# Patient Record
Sex: Male | Born: 1935 | Race: White | Hispanic: No | Smoking: Current every day smoker
Health system: Southern US, Community
[De-identification: ages and names within clinical notes are randomized; demographics above are authoritative.]

## PROBLEM LIST (undated history)

## (undated) DIAGNOSIS — D4959 Neoplasm of unspecified behavior of other genitourinary organ: Secondary | ICD-10-CM

## (undated) DIAGNOSIS — I10 Essential (primary) hypertension: Secondary | ICD-10-CM

## (undated) DIAGNOSIS — K859 Acute pancreatitis without necrosis or infection, unspecified: Secondary | ICD-10-CM

## (undated) DIAGNOSIS — Z978 Presence of other specified devices: Secondary | ICD-10-CM

## (undated) DIAGNOSIS — C801 Malignant (primary) neoplasm, unspecified: Secondary | ICD-10-CM

## (undated) DIAGNOSIS — R339 Retention of urine, unspecified: Secondary | ICD-10-CM

## (undated) DIAGNOSIS — M8430XA Stress fracture, unspecified site, initial encounter for fracture: Secondary | ICD-10-CM

## (undated) DIAGNOSIS — M8580 Other specified disorders of bone density and structure, unspecified site: Secondary | ICD-10-CM

## (undated) DIAGNOSIS — J984 Other disorders of lung: Secondary | ICD-10-CM

## (undated) DIAGNOSIS — W19XXXA Unspecified fall, initial encounter: Secondary | ICD-10-CM

## (undated) DIAGNOSIS — F060 Psychotic disorder with hallucinations due to known physiological condition: Secondary | ICD-10-CM

## (undated) DIAGNOSIS — R443 Hallucinations, unspecified: Secondary | ICD-10-CM

## (undated) DIAGNOSIS — S022XXA Fracture of nasal bones, initial encounter for closed fracture: Secondary | ICD-10-CM

## (undated) DIAGNOSIS — I499 Cardiac arrhythmia, unspecified: Secondary | ICD-10-CM

## (undated) DIAGNOSIS — F419 Anxiety disorder, unspecified: Secondary | ICD-10-CM

## (undated) DIAGNOSIS — I4891 Unspecified atrial fibrillation: Secondary | ICD-10-CM

## (undated) DIAGNOSIS — S72009A Fracture of unspecified part of neck of unspecified femur, initial encounter for closed fracture: Secondary | ICD-10-CM

## (undated) DIAGNOSIS — R4781 Slurred speech: Secondary | ICD-10-CM

## (undated) DIAGNOSIS — N390 Urinary tract infection, site not specified: Secondary | ICD-10-CM

## (undated) DIAGNOSIS — R2681 Unsteadiness on feet: Secondary | ICD-10-CM

## (undated) DIAGNOSIS — Z960 Presence of urogenital implants: Secondary | ICD-10-CM

## (undated) DIAGNOSIS — L97309 Non-pressure chronic ulcer of unspecified ankle with unspecified severity: Secondary | ICD-10-CM

## (undated) DIAGNOSIS — F99 Mental disorder, not otherwise specified: Secondary | ICD-10-CM

## (undated) HISTORY — PX: RETINAL DETACHMENT SURGERY: SHX105

## (undated) HISTORY — PX: CYSTOSCOPY: SUR368

## (undated) HISTORY — PX: PARTIAL HIP ARTHROPLASTY: SHX733

## (undated) HISTORY — PX: TRANSURETHRAL RESECTION OF BLADDER TUMOR: SHX2575

## (undated) HISTORY — PX: CHOLECYSTECTOMY: SHX55

---

## 1998-05-22 ENCOUNTER — Ambulatory Visit (HOSPITAL_COMMUNITY): Admission: RE | Admit: 1998-05-22 | Discharge: 1998-05-22 | Payer: Self-pay | Admitting: Urology

## 2001-02-17 ENCOUNTER — Ambulatory Visit (HOSPITAL_COMMUNITY): Admission: RE | Admit: 2001-02-17 | Discharge: 2001-02-17 | Payer: Self-pay | Admitting: *Deleted

## 2001-04-25 ENCOUNTER — Ambulatory Visit (HOSPITAL_COMMUNITY): Admission: RE | Admit: 2001-04-25 | Discharge: 2001-04-25 | Payer: Self-pay | Admitting: *Deleted

## 2001-04-25 ENCOUNTER — Encounter: Payer: Self-pay | Admitting: *Deleted

## 2002-12-22 LAB — HM COLONOSCOPY

## 2003-05-17 ENCOUNTER — Inpatient Hospital Stay (HOSPITAL_COMMUNITY): Admission: EM | Admit: 2003-05-17 | Discharge: 2003-05-18 | Payer: Self-pay | Admitting: Emergency Medicine

## 2003-05-17 ENCOUNTER — Encounter: Payer: Self-pay | Admitting: Internal Medicine

## 2005-06-19 ENCOUNTER — Encounter: Admission: RE | Admit: 2005-06-19 | Discharge: 2005-06-19 | Payer: Self-pay | Admitting: Urology

## 2005-06-24 ENCOUNTER — Ambulatory Visit (HOSPITAL_BASED_OUTPATIENT_CLINIC_OR_DEPARTMENT_OTHER): Admission: RE | Admit: 2005-06-24 | Discharge: 2005-06-24 | Payer: Self-pay | Admitting: Urology

## 2005-06-24 ENCOUNTER — Ambulatory Visit (HOSPITAL_COMMUNITY): Admission: RE | Admit: 2005-06-24 | Discharge: 2005-06-24 | Payer: Self-pay | Admitting: Urology

## 2005-06-24 ENCOUNTER — Encounter (INDEPENDENT_AMBULATORY_CARE_PROVIDER_SITE_OTHER): Payer: Self-pay | Admitting: Specialist

## 2007-05-20 ENCOUNTER — Ambulatory Visit (HOSPITAL_COMMUNITY): Admission: RE | Admit: 2007-05-20 | Discharge: 2007-05-20 | Payer: Self-pay | Admitting: Dermatology

## 2007-08-19 ENCOUNTER — Ambulatory Visit: Payer: Self-pay | Admitting: Internal Medicine

## 2007-08-26 ENCOUNTER — Ambulatory Visit: Payer: Self-pay | Admitting: Internal Medicine

## 2007-09-05 ENCOUNTER — Ambulatory Visit: Payer: Self-pay

## 2007-09-05 ENCOUNTER — Ambulatory Visit: Payer: Self-pay | Admitting: Internal Medicine

## 2007-09-05 ENCOUNTER — Encounter: Payer: Self-pay | Admitting: Internal Medicine

## 2007-09-19 ENCOUNTER — Ambulatory Visit: Payer: Self-pay | Admitting: Cardiovascular Disease

## 2007-10-10 ENCOUNTER — Ambulatory Visit: Payer: Self-pay | Admitting: Internal Medicine

## 2007-10-26 ENCOUNTER — Ambulatory Visit (HOSPITAL_COMMUNITY): Admission: RE | Admit: 2007-10-26 | Discharge: 2007-10-26 | Payer: Self-pay | Admitting: Ophthalmology

## 2007-11-07 ENCOUNTER — Ambulatory Visit: Payer: Self-pay | Admitting: Cardiology

## 2007-11-29 ENCOUNTER — Ambulatory Visit: Payer: Self-pay | Admitting: Internal Medicine

## 2007-11-29 ENCOUNTER — Ambulatory Visit: Payer: Self-pay | Admitting: Cardiology

## 2008-10-01 ENCOUNTER — Ambulatory Visit: Payer: Self-pay | Admitting: Internal Medicine

## 2009-10-17 ENCOUNTER — Ambulatory Visit: Payer: Self-pay | Admitting: Internal Medicine

## 2009-10-18 DIAGNOSIS — I10 Essential (primary) hypertension: Secondary | ICD-10-CM

## 2009-10-18 DIAGNOSIS — I4891 Unspecified atrial fibrillation: Secondary | ICD-10-CM

## 2009-10-18 DIAGNOSIS — I495 Sick sinus syndrome: Secondary | ICD-10-CM

## 2009-10-28 ENCOUNTER — Encounter: Payer: Self-pay | Admitting: Internal Medicine

## 2010-02-03 ENCOUNTER — Encounter: Payer: Self-pay | Admitting: Internal Medicine

## 2010-02-05 ENCOUNTER — Ambulatory Visit (HOSPITAL_COMMUNITY): Admission: RE | Admit: 2010-02-05 | Discharge: 2010-02-05 | Payer: Self-pay | Admitting: Ophthalmology

## 2010-10-22 ENCOUNTER — Ambulatory Visit: Payer: Self-pay | Admitting: Internal Medicine

## 2011-01-09 ENCOUNTER — Encounter: Payer: Self-pay | Admitting: Internal Medicine

## 2011-01-16 ENCOUNTER — Encounter
Admission: RE | Admit: 2011-01-16 | Discharge: 2011-01-16 | Payer: Self-pay | Source: Home / Self Care | Attending: Internal Medicine | Admitting: Internal Medicine

## 2011-01-20 NOTE — Assessment & Plan Note (Signed)
Summary: yearly/sl   Visit Type:  Follow-up Primary Brent Day:  Brent Brooklyn, MD   History of Present Illness: Brent Day returns today for followup.  He is a very pleasant 77 year old male with a history of paroxysmal AFib and rare palpitations who we have treated with medications and strategy of rate control for the last year or two.  He returns today for followup.  He denies chest pain.  He denies shortness of breath.  He continued to be quite active.    Current Medications (verified): 1)  Metoprolol Succinate 50 Mg Xr24h-Tab (Metoprolol Succinate) .... Take One and Half  Tablet By Mouth Daily 2)  Benicar 40 Mg Tabs (Olmesartan Medoxomil) .... Take One Tablet By Mouth Daily 3)  Gemfibrozil 600 Mg Tabs (Gemfibrozil) .... Take 1 By Mouth Once Daily 4)  Pradaxa 150 Mg Caps (Dabigatran Etexilate Mesylate) .... Two Times A Day 5)  Multivitamins  Tabs (Multiple Vitamin) .... Once Daily 6)  Vitamin D3 2000 Unit Tabs (Cholecalciferol) .... Daily 7)  Vitamin E 200 Unit Caps (Vitamin E) .... Once Daily 8)  Vitamin C 250 Mg Tabs (Ascorbic Acid) .... Once Daily 9)  Flax   Oil (Flaxseed (Linseed)) .... Daily  Allergies (verified): No Known Drug Allergies  Past History:  Past Medical History: Last updated: 10/09/2009  Paroxysmal atrial fibrillation.    Chronic Coumadin therapy.    Borderline hypertension.    Palpitations with documented premature atrial contractions.   Review of Systems  The patient denies chest pain, syncope, dyspnea on exertion, and peripheral edema.    Vital Signs:  Patient profile:   77 year old male Height:      71 inches Weight:      168 pounds BMI:     23.52 Pulse rate:   88 / minute BP sitting:   130 / 70  (left arm)  Vitals Entered By: Laurance Flatten CMA (October 22, 2010 3:25 PM)  Physical Exam  General:  Elderly, well developed, well nourished, in no acute distress.  HEENT: normal Neck: supple. No JVD. Carotids 2+ bilaterally no bruits Cor: IRRR  no rubs, gallops or murmur Lungs: CTA Ab: soft, nontender. nondistended. No HSM. Good bowel sounds Ext: warm. no cyanosis, clubbing or edema Neuro: alert and oriented. Grossly nonfocal. affect pleasant    EKG  Procedure date:  10/22/2010  Findings:      Normal sinus rhythm with rate of:  74.  Impression & Recommendations:  Problem # 1:  ATRIAL FIBRILLATION (ICD-427.31) He appears to be maintaining NSR very nicely despite a strategy of rate control. Will continue. His updated medication list for this problem includes:    Metoprolol Succinate 50 Mg Xr24h-tab (Metoprolol succinate) .Marland Kitchen... Take one and half  tablet by mouth daily  Problem # 2:  ESSENTIAL HYPERTENSION, BENIGN (ICD-401.1) His blood pressure is well controlled.  He will maintain a low sodium diet. His updated medication list for this problem includes:    Metoprolol Succinate 50 Mg Xr24h-tab (Metoprolol succinate) .Marland Kitchen... Take one and half  tablet by mouth daily    Benicar 40 Mg Tabs (Olmesartan medoxomil) .Marland Kitchen... Take one tablet by mouth daily

## 2011-02-27 ENCOUNTER — Emergency Department (HOSPITAL_COMMUNITY): Payer: Medicare Other

## 2011-02-27 ENCOUNTER — Inpatient Hospital Stay (HOSPITAL_COMMUNITY)
Admission: EM | Admit: 2011-02-27 | Discharge: 2011-03-06 | DRG: 418 | Disposition: A | Discharge status: Home Health | Payer: Medicare Other | Attending: Internal Medicine | Admitting: Internal Medicine

## 2011-02-27 DIAGNOSIS — K861 Other chronic pancreatitis: Secondary | ICD-10-CM | POA: Diagnosis present

## 2011-02-27 DIAGNOSIS — T380X5A Adverse effect of glucocorticoids and synthetic analogues, initial encounter: Secondary | ICD-10-CM | POA: Diagnosis not present

## 2011-02-27 DIAGNOSIS — R5381 Other malaise: Secondary | ICD-10-CM | POA: Diagnosis present

## 2011-02-27 DIAGNOSIS — I4891 Unspecified atrial fibrillation: Secondary | ICD-10-CM | POA: Diagnosis present

## 2011-02-27 DIAGNOSIS — K859 Acute pancreatitis without necrosis or infection, unspecified: Principal | ICD-10-CM | POA: Diagnosis present

## 2011-02-27 DIAGNOSIS — K8689 Other specified diseases of pancreas: Secondary | ICD-10-CM | POA: Diagnosis present

## 2011-02-27 DIAGNOSIS — K863 Pseudocyst of pancreas: Secondary | ICD-10-CM | POA: Diagnosis present

## 2011-02-27 DIAGNOSIS — K862 Cyst of pancreas: Secondary | ICD-10-CM | POA: Diagnosis present

## 2011-02-27 DIAGNOSIS — F17200 Nicotine dependence, unspecified, uncomplicated: Secondary | ICD-10-CM | POA: Diagnosis present

## 2011-02-27 DIAGNOSIS — K8020 Calculus of gallbladder without cholecystitis without obstruction: Secondary | ICD-10-CM | POA: Diagnosis present

## 2011-02-27 DIAGNOSIS — E785 Hyperlipidemia, unspecified: Secondary | ICD-10-CM | POA: Diagnosis present

## 2011-02-27 DIAGNOSIS — I10 Essential (primary) hypertension: Secondary | ICD-10-CM | POA: Diagnosis present

## 2011-02-27 DIAGNOSIS — N289 Disorder of kidney and ureter, unspecified: Secondary | ICD-10-CM | POA: Diagnosis present

## 2011-02-27 DIAGNOSIS — D72829 Elevated white blood cell count, unspecified: Secondary | ICD-10-CM | POA: Diagnosis not present

## 2011-02-27 LAB — CBC
HCT: 39.3 % (ref 39.0–52.0)
Hemoglobin: 13.1 g/dL (ref 13.0–17.0)
MCH: 31.5 pg (ref 26.0–34.0)
MCHC: 33.3 g/dL (ref 30.0–36.0)
MCV: 94.5 fL (ref 78.0–100.0)
RDW: 13 % (ref 11.5–15.5)

## 2011-02-27 LAB — COMPREHENSIVE METABOLIC PANEL
Albumin: 2.7 g/dL — ABNORMAL LOW (ref 3.5–5.2)
BUN: 30 mg/dL — ABNORMAL HIGH (ref 6–23)
Chloride: 101 mEq/L (ref 96–112)
Creatinine, Ser: 1.26 mg/dL (ref 0.4–1.5)
Total Bilirubin: 0.5 mg/dL (ref 0.3–1.2)
Total Protein: 7.3 g/dL (ref 6.0–8.3)

## 2011-02-27 LAB — URINALYSIS, ROUTINE W REFLEX MICROSCOPIC
Bilirubin Urine: NEGATIVE
Glucose, UA: NEGATIVE mg/dL
Hgb urine dipstick: NEGATIVE
Nitrite: NEGATIVE

## 2011-02-27 LAB — DIFFERENTIAL
Basophils Absolute: 0 10*3/uL (ref 0.0–0.1)
Eosinophils Relative: 1 % (ref 0–5)
Lymphocytes Relative: 11 % — ABNORMAL LOW (ref 12–46)
Lymphs Abs: 2.2 10*3/uL (ref 0.7–4.0)
Monocytes Absolute: 1.1 10*3/uL — ABNORMAL HIGH (ref 0.1–1.0)
Monocytes Relative: 5 % (ref 3–12)
Neutro Abs: 16.9 10*3/uL — ABNORMAL HIGH (ref 1.7–7.7)

## 2011-02-27 LAB — TROPONIN I: Troponin I: 0.01 ng/mL (ref 0.00–0.06)

## 2011-02-27 LAB — CK TOTAL AND CKMB (NOT AT ARMC)
CK, MB: 1.9 ng/mL (ref 0.3–4.0)
Total CK: 47 U/L (ref 7–232)

## 2011-02-27 MED ORDER — IOHEXOL 300 MG/ML  SOLN: 125.0000 mL | Freq: Once | INTRAMUSCULAR | Status: AC | PRN

## 2011-02-28 ENCOUNTER — Inpatient Hospital Stay (HOSPITAL_COMMUNITY): Payer: Medicare Other

## 2011-02-28 LAB — BASIC METABOLIC PANEL
Calcium: 8.4 mg/dL (ref 8.4–10.5)
Chloride: 103 mEq/L (ref 96–112)
Creatinine, Ser: 1 mg/dL (ref 0.4–1.5)
GFR calc Af Amer: >60 mL/min (ref >60)
GFR calc non Af Amer: >60 mL/min (ref >60)

## 2011-02-28 LAB — DIFFERENTIAL
Basophils Absolute: 0 10*3/uL (ref 0.0–0.1)
Eosinophils Absolute: 0.3 10*3/uL (ref 0.0–0.7)
Lymphs Abs: 2.3 10*3/uL (ref 0.7–4.0)
Neutrophils Relative %: 76 % (ref 43–77)

## 2011-02-28 LAB — CBC
MCV: 95.3 fL (ref 78.0–100.0)
Platelets: 314 10*3/uL (ref 150–400)
RBC: 3.38 MIL/uL — ABNORMAL LOW (ref 4.22–5.81)
WBC: 13.3 10*3/uL — ABNORMAL HIGH (ref 4.0–10.5)

## 2011-02-28 LAB — PROTIME-INR
INR: 1.25 (ref 0.00–1.49)
Prothrombin Time: 15.9 seconds — ABNORMAL HIGH (ref 11.6–15.2)

## 2011-02-28 LAB — CARDIAC PANEL(CRET KIN+CKTOT+MB+TROPI)
Relative Index: INVALID (ref 0.0–2.5)
Total CK: 40 U/L (ref 7–232)
Troponin I: 0.02 ng/mL (ref 0.00–0.06)

## 2011-02-28 LAB — APTT: aPTT: 36 seconds (ref 24–37)

## 2011-02-28 MED ORDER — GADOBENATE DIMEGLUMINE 529 MG/ML IV SOLN
14.0000 mL | Freq: Once | INTRAVENOUS | Status: AC | PRN
  Administered 2011-02-28: 14 mL via INTRAVENOUS

## 2011-02-28 MED ORDER — IOHEXOL 300 MG/ML  SOLN
125.0000 mL | Freq: Once | INTRAMUSCULAR | Status: AC | PRN
  Administered 2011-02-28: 125 mL via INTRAVENOUS

## 2011-03-01 LAB — COMPREHENSIVE METABOLIC PANEL
ALT: 15 U/L (ref 0–53)
AST: 17 U/L (ref 0–37)
Albumin: 2 g/dL — ABNORMAL LOW (ref 3.5–5.2)
CO2: 23 mEq/L (ref 19–32)
Calcium: 8.4 mg/dL (ref 8.4–10.5)
GFR calc Af Amer: >60 mL/min (ref >60)
GFR calc non Af Amer: >60 mL/min (ref >60)
Sodium: 136 mEq/L (ref 135–145)
Total Protein: 5.5 g/dL — ABNORMAL LOW (ref 6.0–8.3)

## 2011-03-01 LAB — CBC
HCT: 31.8 % — ABNORMAL LOW (ref 39.0–52.0)
Hemoglobin: 10.2 g/dL — ABNORMAL LOW (ref 13.0–17.0)
MCH: 30.6 pg (ref 26.0–34.0)
MCHC: 32.1 g/dL (ref 30.0–36.0)
MCV: 95.5 fL (ref 78.0–100.0)

## 2011-03-02 LAB — CBC
MCH: 30.1 pg (ref 26.0–34.0)
MCHC: 31.6 g/dL (ref 30.0–36.0)
MCV: 95.1 fL (ref 78.0–100.0)
Platelets: 329 10*3/uL (ref 150–400)

## 2011-03-02 LAB — BASIC METABOLIC PANEL
BUN: 10 mg/dL (ref 6–23)
CO2: 25 mEq/L (ref 19–32)
Calcium: 8.5 mg/dL (ref 8.4–10.5)
Creatinine, Ser: 0.91 mg/dL (ref 0.4–1.5)
GFR calc Af Amer: >60 mL/min (ref >60)

## 2011-03-03 ENCOUNTER — Other Ambulatory Visit: Payer: Self-pay | Admitting: Gastroenterology

## 2011-03-03 LAB — BASIC METABOLIC PANEL
Calcium: 8.4 mg/dL (ref 8.4–10.5)
Creatinine, Ser: 0.87 mg/dL (ref 0.4–1.5)
GFR calc Af Amer: >60 mL/min (ref >60)
GFR calc non Af Amer: >60 mL/min (ref >60)
Glucose, Bld: 93 mg/dL (ref 70–99)
Sodium: 138 mEq/L (ref 135–145)

## 2011-03-05 ENCOUNTER — Other Ambulatory Visit: Payer: Self-pay | Admitting: Surgery

## 2011-03-05 ENCOUNTER — Inpatient Hospital Stay (HOSPITAL_COMMUNITY): Payer: Medicare Other

## 2011-03-05 LAB — BASIC METABOLIC PANEL
BUN: 9 mg/dL (ref 6–23)
Calcium: 8.5 mg/dL (ref 8.4–10.5)
Creatinine, Ser: 1.04 mg/dL (ref 0.4–1.5)
GFR calc non Af Amer: >60 mL/min (ref >60)
Glucose, Bld: 105 mg/dL — ABNORMAL HIGH (ref 70–99)
Potassium: 4.2 mEq/L (ref 3.5–5.1)

## 2011-03-05 LAB — CBC
HCT: 33.5 % — ABNORMAL LOW (ref 39.0–52.0)
MCHC: 31.9 g/dL (ref 30.0–36.0)
MCV: 95.7 fL (ref 78.0–100.0)
Platelets: 352 10*3/uL (ref 150–400)
RDW: 13.1 % (ref 11.5–15.5)
WBC: 8.2 10*3/uL (ref 4.0–10.5)

## 2011-03-06 LAB — CBC
HCT: 33.7 % — ABNORMAL LOW (ref 39.0–52.0)
MCHC: 31.8 g/dL (ref 30.0–36.0)
Platelets: 351 10*3/uL (ref 150–400)
RDW: 13.3 % (ref 11.5–15.5)
WBC: 8.8 10*3/uL (ref 4.0–10.5)

## 2011-03-06 LAB — CULTURE, BLOOD (ROUTINE X 2)
Culture  Setup Time: 201203101159
Culture: NO GROWTH

## 2011-03-06 LAB — COMPREHENSIVE METABOLIC PANEL
ALT: 30 U/L (ref 0–53)
AST: 45 U/L — ABNORMAL HIGH (ref 0–37)
Albumin: 2.2 g/dL — ABNORMAL LOW (ref 3.5–5.2)
Alkaline Phosphatase: 74 U/L (ref 39–117)
Calcium: 8.5 mg/dL (ref 8.4–10.5)
GFR calc Af Amer: >60 mL/min (ref >60)
Glucose, Bld: 104 mg/dL — ABNORMAL HIGH (ref 70–99)
Potassium: 4.1 mEq/L (ref 3.5–5.1)
Sodium: 139 mEq/L (ref 135–145)
Total Protein: 5.5 g/dL — ABNORMAL LOW (ref 6.0–8.3)

## 2011-03-06 NOTE — Op Note (Signed)
NAME:  Brent Day, Brent Day NO.:  [id number]  MEDICAL RECORD NO.:  [id number]           PATIENT TYPE:  I  LOCATION:  1507                         FACILITY:  [facility]  PHYSICIAN:  Aeron Donaghey A. Bassam Dresch, M.D.DATE OF BIRTH:  1927/08/19  DATE OF PROCEDURE:  03/05/2011 DATE OF DISCHARGE:                              OPERATIVE REPORT   PREOPERATIVE DIAGNOSIS:  Gallstone pancreatitis.  POSTOPERATIVE DIAGNOSIS:  Gallstone pancreatitis.  PROCEDURE:  Laparoscopic cholecystectomy with intraoperative cholangiogram.  SURGEON:  Kleo Dungee A. Johnathon Mittal, MD  ANESTHESIA:  General endotracheal anesthesia, 0.25% Sensorcaine locally.  ESTIMATED BLOOD LOSS:  Minimal.  SPECIMEN:  Gallbladder, gallstones to Pathology.  DRAINS:  None.  INDICATIONS FOR PROCEDURE:  The patient is a pleasant 77 year old male admitted with gallstone pancreatitis.  Pancreatitis resolved and he presents today for laparoscopic cholecystectomy and discussion of procedure, rationale for doing it, risks and benefits and long-term expectations.  Risk of bleeding, infection, bile duct injury, injury to colon, small bowel, liver, stomach, duodenum, abdominal wall diaphragm as well as bile leak with possible abscess drain and other procedures needed were discussed.  He agreed to proceed.  DESCRIPTION OF PROCEDURE:  The patient was brought to the operating room, placed supine.  After induction of general anesthesia, the abdomen was prepped and draped in sterile fashion.  Time-out was done and he received preoperative antibiotics.  A 1 cm infraumbilical incision was made.  Dissection was carried down to the fascia.  Fascia was opened in lower midline.  The abdominal cavity was entered.  Pursestring suture of 0 Vicryl was placed and 12-mm Hasson cannula was placed under direct vision.  Pneumoperitoneum was created at 15 mmHg, CO2 and laparoscope was placed.  He was placed in reverse Trendelenburg and rolled to his left.   Four quadrant laparoscopy was done with no evidence of significant abnormality.  He had some small benign appearing liver cyst. A 11-mm subxiphoid was placed and two 5 mm ports were placed under direct vision.  Gallbladder was identified, grabbed by its dome, retracted to the patient's right shoulder.  Second grasper was used to grab the neck of the gallbladder and infundibulum, pulled to the patient's right lower quadrant.  Some very loose omental adhesions were taken down.  The neck of the gallbladder was identified.  We dissected around circumferentially to identify the cystic duct.  Small tear was made in the gallbladder with spillage of bile with no evidence of infection, no stones or leak.  Clips were placed on gallbladder side. Through a small incision, a Cook cholangiogram catheter was introduced through a separate stab with the abdominal wall and the cystic duct. Small clips were placed across this.  Free flow of contrast down to the cystic duct was noted with cholangiogram.  He had a right hepatic duct that appeared to come off the right where the cystic duct inserted, so there was a very low bifurcation to the common hepatic duct.  The right and left ducts were identified and preserved.  The common duct was identified and preserved.  Free flow of contrast without extravasation was done.  A very long tortuous cystic duct noted.  At this point in time, I removed the catheter and placed 4 clips across the cystic duct, stumped and divided it.  We then controlled the cystic artery between clips.  There were 2 small posterior branches in cystic artery controlled between clips.  We then used cautery to dissect the gallbladder from the gallbladder fossa.  We then moved to the gallbladder and EndoCatch bag.  Gallbladder was examined and was found to be hemostatic with no signs of bleeding.  Little bit of oozing from edge.  We used Surgicel with good result.  We then suctioned  and irrigation until clear.  Irrigation was used to suction out to clear. There were no signs of any bleeding or extravasation.  We then extracted the gallbladder to the umbilicus, passed off the field, closing the port site with pursestring suture of 0 Vicryl.  We then removed our ports with no signs of port site bleeding.  Four quadrant laparoscopy was done prior to this which showed no evidence of injury.  CO2 escaped.  We closed the skin incisions with 4-0 Monocryl and Dermabond.  All final counts of sponge, needle, and instrument was found to be correct at this portion of case.  The patient awoke, extubated, taken to the operating room in satisfactory condition.  All final counts were found to be correct.     [name] A. Dann Ventress, M.D.     TAC/MEDQ  D:  03/05/2011  T:  03/06/2011  Job:  [id number]  Electronically Signed by Harriette Bouillon M.D. on 03/06/2011 09:21:39 AM

## 2011-03-11 LAB — BASIC METABOLIC PANEL
CO2: 23 mEq/L (ref 19–32)
Calcium: 9.1 mg/dL (ref 8.4–10.5)
Chloride: 112 mEq/L (ref 96–112)
Creatinine, Ser: 1.07 mg/dL (ref 0.4–1.5)
GFR calc Af Amer: >60 mL/min (ref >60)
Glucose, Bld: 93 mg/dL (ref 70–99)

## 2011-03-11 LAB — PROTIME-INR: Prothrombin Time: 17.5 seconds — ABNORMAL HIGH (ref 11.6–15.2)

## 2011-03-11 LAB — CBC
Hemoglobin: 13.6 g/dL (ref 13.0–17.0)
MCHC: 34.8 g/dL (ref 30.0–36.0)
MCV: 97.7 fL (ref 78.0–100.0)
RBC: 4.01 MIL/uL — ABNORMAL LOW (ref 4.22–5.81)
RDW: 13.4 % (ref 11.5–15.5)

## 2011-03-17 NOTE — Discharge Summary (Signed)
NAME:  Brent Day, Brent Day NO.:  [id number]  MEDICAL RECORD NO.:  [id number]           PATIENT TYPE:  I  LOCATION:  1507                         FACILITY:  [facility]  PHYSICIAN:  Hartley Barefoot, MD    DATE OF BIRTH:  Apr 20, 1927  DATE OF ADMISSION:  02/27/2011 DATE OF DISCHARGE:  03/06/2011                              DISCHARGE SUMMARY   DISCHARGE DIAGNOSES: 1. Acute on chronic pancreatitis with pseudocyst and pancreatic duct    obstruction. 2. Status post cholecystectomy on March 06, 2011. 3. Pancreatitis with pseudocyst.  OTHER PAST MEDICAL HISTORY: 1. AFib. 2. Hypertension. 3. History of bladder cancer more than 50 years ago. 4. History of diverticulitis. 5. Retinal detachment x3 on the left eye.  DISCHARGE MEDICATIONS: 1. Ciprofloxacin 500 mg twice daily for 1 more day. 2. Docusate 100 mg 1 tablet by mouth twice daily as needed for     constipation. 3. Oxycodone 1-2 tablet by mouth every 6 hours as needed. 4. Pantoprazole 40 mg p.o. daily. 5. Aspirin 325 mg 1 tablet p.o. daily. 6. Finasteride 5 mg tablet p.o. daily. 7. Flaxseed oil 2 tablet by mouth daily. 8. Gemfibrozil 600 mg 1 tablet by mouth twice daily. 9. Lorazepam 0.5 mg 1 tablet by mouth daily at bedtime as needed. 10.Metoprolol 25 mg p.o. b.i.d. 11.Vitamin B 1 tablet by mouth daily. 12.Vitamin C 250 mg 1 tablet by mouth daily. 13.Vitamin D 2000 units 1 tablet by mouth daily. 14.Vitamin E 1 capsule by mouth daily.  STUDIES PERFORMED: 1. CT abdomen and pelvis showed calcification within the body of the     pancreas with ductal dilation, suggest chronic pancreatitis.     Moderate size pseudocyst adjacent to the pancreatic body and     stomach.  Finding most consistent with acute on chronic     pancreatitis.  Cannot exclude pancreatic neoplasm.  High density     cyst within the left kidney cannot be fully characterized.     Recommend MRI.  2. Ultrasound of abdomen on March 10 show no evidence     of ascites, cholelithiasis with upper limit of normal gallbladder     wall thickness.  This study does not represent acute cholecystitis     but correlate clinically.  Pancreas not well visualized.  Cyst on     the left kidney 1.7 cm.  Common bile duct, no evidence of     intrahepatic or extrahepatic biliary dilation.  The common bile     duct measured 6 mm.  Mobile gallstone are identified, the largest     measuring 1.5.  3. MRI of abdomen showed cystic lesion with enhancing,     irregular, multiple surrounding infiltrate indicative of edema and     enhancement in the tail of the pancreas with dilated pancreatic     duct in the tail of the pancreas.  Stable acute on chronic     pancreatitis with pseudocyst.  The transition of pancreatic duct     caliber appears to correspond to a large dense calcification on CT     scan resembling a large chronic pancreatitis calcification which  may be causing some partial obstruction of the pancreatic duct.  He     did also have cystic lesion in the tail of the pancreas, which are     presumably due to acute pancreatitis, it is very difficult to     exclude the possibility of malignancy.  No mass, benign-appearing     hepatic and renal cyst.  No pancreatic necrosis or abscess     identified.  Mild abdominal tightness.  Small left pleural     effusion.  Cholelithiasis. 4. Cholangiogram, negative intraoperative cholangiogram. 5. Upper endoscopic ultrasonogram show chronic pancreatitis with     pancreatic duct stone.  Peripancreatic cyst, likely pseudocyst with     cyst aspiration performed.  Partially obstructive pancreatic duct     comorbidity, gallstones, performed March 13 by Dr. Dulce Sellar.  CONSULTANT: 1. Thomas A. Cornett, MD 2. Bernette Redbird, MD 3. Willis Modena, MD  BRIEF HISTORY OF PRESENT ILLNESS:  This is a very pleasant 77 year old, who has been experiencing left upper quadrant abdominal pain, anorexia on and off, nausea, couple of  episodes of vomiting and weight loss of about 20 pounds over the last 2 months.  Symptoms have progressively getting worse.  Abdominal pain has radiating bilateral to the left shoulder.  His pain is worse with food.  The patient was found to have on a CT abdomen suggestion of calcification of the pancreas and ductal dilation suggesting acute on chronic pancreatitis.  There were 2 moderate pseudocyst.  HOSPITAL COURSE: 1. Acute on chronic pancreatitis.  The patient had multiple test MRI,     endoscopy, ultrasonography, which result as above show acute on     chronic pancreatitis with pseudocyst and an obstructed pancreatic     duct.  The patient had a pseudocyst aspiration.  Fluid was sent for     pathology.  Pathology need to be follow up.  The patient's     abdominal pain improved during this hospitalization.  Surgery and     Dr. Dulce Sellar were following the patient as well and helping him with     the care.  The patient had a cholecystectomy also.  There is a     possibility that his pancreatitis could be secondary to the partial     obstruction of pancreatic duct.  Dr. Dulce Sellar was recommending a     tertiary center referral for surgery.  Dr. Luisa Hart will arrange     this.  The patient was started on ciprofloxacin post     ultrasonography endoscopy to prevent infection.  He will finish 1     more day.  He had a total of 3 days course. 2. Cholelithiasis.  The patient had a cholecystectomy to prevent     pancreatitis.  He will follow with Dr. Luisa Hart. 3. AFib.  His heart rate was controlled, aspirin was on hold due to     surgery. will restart his aspirin.  Continue with metoprolol. 4. Hypertension.  Continue with metoprolol. 5. Acute renal insufficiency, resolved with IV fluid.  On the date of     discharge, creatinine was 1.05. 6. Weakness and fall.  The patient will have PT/OT at home. 7. Leukocytosis likely secondary to steroid.  White blood cell has     normalized.  The patient was  discharged in improved condition, tolerating diet.  No abdominal pain.  Blood pressure 146/66 to 98/60, sat 97 on room air, pulse 85, temperature 98.2, respirations 18.  DISCHARGE LABS:  Sodium 139, potassium 4.1,  chloride 106, bicarb 27, glucose 104, BUN 8, creatinine 1.05, white blood cell 8.8, hemoglobin 10.7, platelets 351,000.  The patient was discharged in improved condition.     Hartley Barefoot, MD     BR/MEDQ  D:  03/06/2011  T:  03/06/2011  Job:  [id number]  Electronically Signed by Hartley Barefoot MD on 03/17/2011 01:33:06 PM

## 2011-03-20 NOTE — H&P (Signed)
NAME:  Brent Day, SPARR NO.:  [id number]  MEDICAL RECORD NO.:  [id number]           PATIENT TYPE:  E  LOCATION:  WLED                         FACILITY:  [facility]  PHYSICIAN:  Conley Canal, MD      DATE OF BIRTH:  1927/02/15  DATE OF ADMISSION:  02/28/2011 DATE OF DISCHARGE:                             HISTORY & PHYSICAL   CHIEF COMPLAINT:  Abdominal pain, nausea, weight loss.  HISTORY OF PRESENT ILLNESS:  This 77 year old male is followed in primary care by Dr. Elisabeth Most.  He has been experiencing left upper quadrant abdominal pain, anorexia, off and on nausea and few incidents of vomiting, and weight loss of about 20 pounds over the past 2 months. The symptoms have progressively worsened.  The abdominal pain has a radiating character to the left shoulder.  The pain and nausea seem to worsen with food, and the patient complains he has no appetite.  He presents to [facility] Emergency Room where a CT scan of the abdomen suggests calcification of the pancreas and ductal dilation suggesting acute on chronic pancreatitis.  There were 2 moderate pseudocysts.  His lipase was noted elevated.  His renal function has declined compared to baseline suggesting a degree of dehydration and acute renal insufficiency.  Emergency department physician contacted Dr. Andrey Campanile, General Surgery, who recommended medical admit and further workup.  He is admitted to [facility] team [facility].  PAST MEDICAL HISTORY: 1. Hypertension. 2. Atrial fibrillation. 3. Bladder cancer about 15 years ago, has been cancer free     postprocedure. 4. Diverticulosis. 5. Retinal detachment x3, twice to the left eye.  CURRENT MEDICATIONS:  Per ER med list with med reconciliation pending: 1. Zoloft dose and frequency unknown. 2. Prednisone.  The patient states he was on a taper which has been     completed. 3. Metoprolol dose and frequency unknown. 4. Gemfibrozil dose and frequency unknown. 5.  Finasteride dose and frequency unknown.  ALLERGIES:  Listed.  No known drug allergies.  FAMILY HISTORY:  Father died age 23 of complications of recovered alcoholic.  Mother died age 19, pancreatic cancer.  Has 2 brothers and 2 sisters.  Family propensity for COPD.  SOCIAL HISTORY:  The patient is a lifelong tobacco smoker, currently smokes about 5 cigarettes daily.  No alcohol.  No illicit drugs. Retired from [employer].  REVIEW OF SYSTEMS:  EYES:  States his vision is adequate.  Has had retinal detachment surgery x3, twice on the left and once on the right. EARS:  No hearing loss, discharge, pain.  NOSE:  No rhinitis or sinusitis.  MOUTH/THROAT:  No oral or dental pain.  No dysphagia. CARDIAC:  No central chest pain or palpitation.  States he has a history of atrial fib followed by Dr. Ladona Ridgel.  He was maintained on aspirin. LUNGS:  Denies cough, sputum, dyspnea, orthopnea.  He is a tobacco smoker.  ABDOMEN:  History as above.  URINARY/GENITAL:  Has a history of bladder cancer approximately 15 years ago, treated by Dr. Aldean Ast, Urology.  MUSCULOSKELETAL:  States he is feeling quite weak over the past few weeks particularly.  He has  had 1 fall which he describes as non injurious and purely mechanical.  NEUROLOGIC:  No history of stroke or seizure.  HEMATOLOGIC:  No abnormal bleeding or bruising.  SKIN:  No ulcers or wounds.  Denies any history of skin cancer.  PHYSICAL EXAMINATION:  VITAL SIGNS:  Temperature 98.1, pulse 71, respirations 20, blood pressure 111/64. GENERAL APPEARANCE:  This is a well-developed elderly male in no distress.  He is somewhat somnolent status post morphine sulfate analgesic. HEENT:  Head normocephalic.  Eyes:  Pupils equal.  Ears:  Canals clear and hearing normal to conversational tone.  Nose:  Nares patent without discharge noted.  Oral mucosa pink and moist. NECK:  No jugular venous distention, bruits, adenopathy or thyromegaly. CARDIAC:   Rate and rhythm regular without murmur, S3, S4.  There is no peripheral edema.  Negative Homans. LUNGS:  Breath sounds are clear and equal bilaterally.  No distress or cough.  Stable O2 sats. ABDOMEN:  Soft with positive bowel sounds.  He has mild pain with palpation over the left upper quadrant.  No guarding or rebound tenderness.  No masses or bruits. URINARY/GENITAL:  No bladder pain or CVA tenderness. MUSCULOSKELETAL:  Range of motion is full in all 4 extremities. Strength 5/5 and equal x4. NEUROLOGIC:  Cranial nerves II-XII grossly intact.  No unilateral or focal defects. SKIN:  No ulcers or abnormal bruising seen.  LABORATORY DATA AND RADIOLOGY:  A 2-view chest x-ray notes COPD/emphysema without evidence of acute cardiopulmonary disease.  CT scan of the abdomen and pelvis notes calcification within the body of the pancreas with ductal dilation suggesting chronic pancreatitis.  Two moderate size pseudocysts adjacent to the pancreatic body and stomach. Findings most consistent with acute on chronic pancreatitis.  Cannot completely exclude pancreatic neoplasm.  Recommend correlation with tumor markers and consider followup MRI.  High-density cyst within the left kidney, cannot fully characterize.  Contrast enhanced MRI could be utilized to further evaluate these lesions.  CK 47, MB 1.9, troponin 0.01.  Lipase elevated at 139.  Comprehensive metabolic panel found sodium 135, potassium 4.4, chloride 101, CO2 25, BUN 30 and creatinine 1.26.  This is a bit elevated from February 2011 creatinine 1.07.  His liver function is unremarkable but albumin low at 2.7.  Calcium at 9.2. Urinalysis was unremarkable.  CBC with differential found WBC elevated at 20.4, hemoglobin 13.1, hematocrit 39.3, platelets high at 404. Neutrophil absolute high 16.9.  IMPRESSION/PLAN: 1. Acute on chronic pancreatitis.  We will hydrate the patient and     starting empiric antibiotic therapy with Primaxin.  We  will obtain     an abdominal ultrasound and an MRI of the abdomen and pelvis with     contrast to further evaluate.  Also, tumor markers.  The pancreas     is the most likely source of the left upper quadrant pain and     radiation to the shoulder.  We will recheck lipase in a.m. 2. Acute renal insufficiency/dehydration.  Hydrate with IV fluids,     normal saline 100 mL an hour with 10 mEq potassium chloride per     liter and recheck BMET in a.m.. 3. Leukocytosis.  The patient has been on a prednisone taper which is     at least in part contributory.  Antibiotics as above with Primaxin.     His chest x-ray and urine are benign.  We will check blood     cultures. 4. Chronic atrial fibrillation.  We will hold his  beta blocker     currently and follow per med reconciliation.  We will monitor on     tele and follow 3 sets of cardiac enzymes. 5. Tobacco smoker.  A 7 mg nicotine patch and smoking cessation     required. 6. Weakness and fall.  We will request physical and occupational     therapy. 7. Deep venous thrombosis prophylaxis.  Will use SCDs given unknown     further testing at this point. 8. Code status.  The patient is full code.     Everett Graff, N.P.   ______________________________ Conley Canal, MD    TC/MEDQ  D:  02/28/2011  T:  02/28/2011  Job:  [id number]  cc:   Lovenia Kim, D.O. Fax: [phone]  Electronically Signed by Everett Graff N.P. on 02/28/2011 07:00:22 PM Electronically Signed by Conley Canal  on 03/20/2011 02:09:02 AM

## 2011-03-26 NOTE — Consult Note (Signed)
NAME:  Brent Day, Brent Day NO.:  [id number]  MEDICAL RECORD NO.:  [id number]           PATIENT TYPE:  I  LOCATION:  1507                         FACILITY:  [facility]  PHYSICIAN:  Mary Sella. Andrey Campanile, MD     DATE OF BIRTH:  11/27/1927  DATE OF CONSULTATION:  03/01/2011 DATE OF DISCHARGE:                                CONSULTATION   REQUESTING PHYSICIAN:  Kela Millin, M.D.  REASON FOR CONSULTATION:  Gallstones, pancreatitis, pancreatic pseudocyst.  CHIEF COMPLAINT:  Abdominal pain.  HISTORY OF PRESENT ILLNESS:  Brent Day is a very pleasant 77 year old Caucasian male with a history of hypertension, atrial fibrillation, and remote history of transitional cell bladder cancer who was admitted on March 9th for a 8-month history of intermittent left-sided and left upper quadrant abdominal pain.  He describes the pain as "yucky" feeling.  Sometimes it is postprandial.  Sometimes it is associated with nausea.  It generally lasts for several hours.  When it does occur, he rates it as 6/10 on the pain scale.  He cannot think anything if that aggravates or relieves it.  He denies any associated fevers or chills. He denies any jaundice.  He has had a 15- to 20-pound weight loss over the past 2 months.  He does not have an appetite.  He says everything tastes bad.  He went to his primary care doctor for this problem on several occasions.  It was initially attributed to depression.  He was put on Zoloft.  Most recently, he went back and was put on the prednisone taper.  He came to the emergency room for further evaluation. He states that his mother has a history of pancreatic cancer.  He has noticed no stool changes.  He denies any history of alcohol use.  He also denies any prior admissions a history of pancreatitis.  PAST MEDICAL HISTORY: 1. History of atrial fibrillation. 2. Hypertension. 3. Dyslipidemia. 4. History of retinal detachment 5. History of transitional cell  bladder cancer. 6. History of GI bleed secondary to diverticulosis.  PAST SURGICAL HISTORY: 1. Transurethral resection of the bladder mass in 2006. 2. Colonoscopy in 2004 by Dr. Randa Evens.  MEDICATIONS:  Include Zoloft, prednisone, metoprolol, gemfibrozil, and finasteride  SOCIAL HISTORY:  He does smoke cigarettes on a daily basis and has for many years.  He denies any alcohol use.  He denies any drug use.  FAMILY HISTORY:  Remarkable for his mother having pancreatic cancer.  ALLERGIES:  No known drug allergies.  REVIEW OF SYSTEMS:  He denies any shortness of breath, dyspnea on exertion, or chest pain.  He does report some fatigue and weakness over the past 2 months.  Otherwise, a comprehensive 12-point review of systems is negative as mentioned in the HPI.  PHYSICAL EXAMINATION:  VITAL SIGNS:  Temperature 97.5, pulse 74, blood pressure 135/71, respirations 20, satting 94% on room air. GENERAL:  Well-developed Caucasian male who appears stated age, who looks a little bit rundown.  He appears slightly cachectic. HEENT:  Atraumatic, normocephalic.  Pupils are equal and round.  No scleral icterus.  No external ear lesions.  Hearing grossly normal.  NECK:  Supple.  No lymphadenopathy.  Trachea is midline. PULMONARY:  Lungs are clear. CARDIOVASCULAR:  Regular rhythm. No accessory use of muscles. ABDOMEN:  Soft, nontender, nondistended.  No masses. MUSCULOSKELETAL:  Free range of motion.  Moves all extremities.  No obvious joint deformity.  Strength is symmetric. NEUROLOGIC:  Nonfocal.  Sensation grossly intact. SKIN:  No jaundice.  No rash.  No edema.  He does have multiple bruises in severity, states resolution.  LABORATORY DATA:  From today sodium 136, potassium 3.9, chloride 106, bicarb 23, BUN 14, creatinine 0.9, blood sugar 83, calcium 8.4, total bilirubin 0.5, AST 17, ALT 15, alk phos 59.  Lipase today is 55 on admission it was elevated at 139.  Blood cultures are negative  date. CBC; white count is now 12.1, hemoglobin 10, hematocrit 31.8, platelet count 333, white count on admission was 20,000.  Urinalysis negative. CA 19-9 level was 17.3.  RADIOGRAPHIC DATA: 1. CT of the abdomen and pelvis showed multiple hepatic cysts.  No     biliary ductal dilatation.  Positive gallstones.  No evidence of     acute cholecystitis.  There are several pancreatic calcifications     as well as some pancreatic ductal dilatation.  There is a fluid     collection adjacent to the pancreatic body measuring 7 x 4 cm with     wall thickening.  There is an additional fluid collection measuring     2 x 4.7 along the greater curve of the stomach.  There is no     evidence of splenic vein thrombosis.  No aneurysm.  There is also a     high density cyst on the left kidney. 2. Ultrasound of the abdomen showed multiple gallstones, largest     measuring 1.5.  Gallbladder was upper limits of normal.  No     evidence of pericholecystic fluid.  Common bile duct is normal.  No     evidence of biliary ductal dilatation.  Liver showed multiple     cysts. 3. MRI of the abdomen showed 2 gallstones, multiple hepatic cysts.  No     common bile duct dilatation.  Adrenal glands look normal.  There is     a 1 cm cyst in the kidney, which has a hemorrhagic component.     There is also irregular multilobular cystic process extending to     the pancreatic tail with some involvement in the lesser sac and     extension on the greater curvature of the stomach.  One cystic     lesion with abnormal enhancing margins and surrounding inflammatory     stranding along the greater curve measures 5.7 x 3.2.  A component     immediately along the pancreatic tail measures 7.3 x 4.3.  Another     irregular cystic lesion along the tip of the tail of the pancreas     measures 3.3 x 3.2.  All the lesions have irregular surrounding     enhancement and infiltrates of edema and are ventral to the     slightly dilated  pancreatic duct.  There is no nodular enhancement     within the hepatic cyst lesions.  Splenic vein is patent and the     portal vein is patent.  There is no pancreatic necrosis.  No gas     within the complex pancreatic cyst.  The appearance say it is acute     on chronic pancreatitis with pseudocyst.  The  transition of the     pancreatic duct caliber appears to correspond to a large dense     calcification on the CT scan resembling a large chronic     pancreatitis calcification.  Underlying neoplasm cannot be     excluded.  ASSESSMENT AND PLAN: 70. 77 year old Caucasian male with: 2. Mild acute-on-chronic pancreatitis. 3. Cholelithiasis. 4. Multiple pancreatic pseudocysts. 5. Hypertension. 6. Dyslipidemia. 7. Diverticular disease.  I think he has a very mild case of acute pancreatitis.  I would recommend stopping his Primaxin since there is no sign of necrosis or abscess on CT and MRI.  I do not believe the gallbladder is the source of the pancreatitis as he has had normal LFTs and the common bile duct is normal.  Moreover, I do not believe he would benefit from a laparoscopic cholecystectomy at this time.  The majority of his pain is left-sided which is probably due to chronic pancreatitis in the setting of pseudocyst.  These fluid collections and the pancreas appear more chronic as opposed to an acute process.  Interestingly, the etiology of his chronic pancreatitis is not straight forward.  He denies any alcohol use and there is no prior history of acute pancreatitis episodes. It is possible that the stone in his pancreatic duct is causing obstruction of his pancreatic duct. I also recommended GI Medicine get on board.  I have called Dr. Matthias Hughs. He will probably benefit from an EUS versus ERCP to further evaluate his pancreatic ductal anatomy to see if there is a disconnected duct as well as a sample of the pancreatic pseudocyst fluid to rule out underlying malignancy.  In the  interim, I would continue bowel rest for the mild pancreatitis.  I would also stop the antibiotic.  We will follow along consultation.     Mary Sella. Andrey Campanile, MD     EMW/MEDQ  D:  03/01/2011  T:  03/02/2011  Job:  [id number]  cc:   Lovenia Kim, D.O. Fax: [phone]  Electronically Signed by Gaynelle Adu M.D. on 03/26/2011 07:44:14 AM

## 2011-04-07 NOTE — Consult Note (Signed)
NAME:  Brent Day, ROUTE NO.:  [id number]  MEDICAL RECORD NO.:  [id number]           PATIENT TYPE:  I  LOCATION:  1507                         FACILITY:  [facility]  PHYSICIAN:  Brent Day, M.D.   DATE OF BIRTH:  11/30/1927  DATE OF CONSULTATION:  03/01/2011 DATE OF DISCHARGE:                                CONSULTATION   HISTORY OF PRESENT ILLNESS:  Dr. Gaynelle Day asked [place] to see this 77- year-old gentleman because of an abnormal radiographic appearance of the pancreas and abdominal pain.  The patient's history is well summarized in Dr. Pauline Day note. Basically, the patient gives a several month history of clinical deterioration associated with intermittent abdominal pain in the midabdominal area and left upper quadrant, not clearly provoked by meals, sometimes associated with nausea, queasiness, or dry heaves, but not frank vomiting.  He has not had fevers or rigors.  He has lost about 20 pounds and become so weak that it is hard for him to get around walking.  He was admitted to the hospital 2 days ago, and since then is perhaps feeling a bit better.  He had leukocytosis on admission, probably due to the fact that he had been tried empirically on steroids to stimulate his appetite and he was taking them as recently as a day or two prior to admission, whereas they have not been continued since admission.  Radiographic evaluation has included an abdominal ultrasound, which showed multiple gallstones and a normal CBD measuring 6 mm in diameter, normal liver and no evidence of ascites.  He also had a CT scan of the abdomen and pelvis which showed calcifications within the pancreatic body and some slight dilatation of the pancreatic duct.  Adjacent to the body of the pancreas was a 7 cm fluid collection felt to be a pseudocyst with an additional, smaller cystic fluid collection nearby.  There was no evidence of frank mass or adenopathy.  Finally, the patient  had an MRI of the abdomen, which raised the question of some possible inflammation in the region of the pancreatic tail.  The overall picture was thought to represent acute-on-chronic pancreatitis with pseudocyst formation.  Again, no discrete pancreatic mass was identified, but it was felt difficult to exclude such a lesion due to all the changes in the pancreas.  On the other hand, the patient's tumor antigen, CA 19-9, is normal.  It is noteworthy that the patient's abdominal CT scan in 2004 did show gallstones, but no pancreatic abnormalities were seen at that time, approximately 8 years ago.  The patient's liver chemistries have been normal.  ALLERGIES:  No known allergies.  OUTPATIENT MEDICATIONS:  Metoprolol, gemfibrozil, finasteride, Zoloft and a brief recent trial of prednisone.  PAST SURGICAL HISTORY:  Bladder cancer excised apparently cystoscopically about 15 years ago without evidence of recurrence.  PAST MEDICAL HISTORY:  Medical illnesses include apparently a history of atrial fibrillation, not requiring ongoing treatment, hypertension, remote bladder cancer and diverticulosis.  It appears that the bladder cancer was removed in 2006 via transurethral resection.  The patient has had colonoscopy in the past by Dr. Randa Day in 2004, which  was done because of acute lower GI bleeding, at which time sigmoid diverticulosis was noted.  HABITS:  The patient is a lifelong nondrinker.  He is a light smoker.  FAMILY HISTORY:  Pertinent for pancreatic cancer in his mother around age 68, but negative for other GI illnesses such as colon cancer.  SOCIAL HISTORY:  The patient is married and lives with his wife who is herself somewhat sick.  A daughter is at the bedside, along with her husband.  He is retired from [employer] and then did work at the [employer], which involved a lot of walking, until about a year ago and he did that for about 15 years  following retirement, so this is generally a very active individual.  REVIEW OF SYSTEMS:  No problem with constipation, diarrhea or dysphagia. It does not sound as though he has a longstanding history of GI tract symptoms, so the symptoms in recent months have been atypical for him. As noted above, there has been a significant weight loss of about 20 pounds.  PHYSICAL EXAMINATION:  GENERAL:  A delightful, but somewhat thin, not quite cachectic-appearing Caucasian male, in no acute distress and appearing neither anxious nor depressed. VITAL SIGNS:  Afebrile.  Blood pressure 151/74, pulse 77, respirations 16 and unlabored. HEENT:  Anicteric.  No pallor. CHEST:  Clear to auscultation. HEART:  Sounds are distant, best heard in the xiphoid region. ABDOMEN:  Without organomegaly, guarding, mass or tenderness. NEUROLOGIC:  Grossly intact.  LABORATORY DATA:  Admission white count 2 days ago was 20,400, currently 12,100.  Post hydration hemoglobin 10.2 with an MCV of 95, platelets 332,000.  Chemistry panel entirely normal except for low protein and albumin, total protein 5.5, albumin 2.0.  Liver chemistries entirely within normal limits.  Lipase was mildly elevated at 139 on admission and has dropped progressively to a current normal level of 55.  However, the patient showed evidence for volume contraction on admission with a BUN of 30 and creatinine of 1.26, which have dropped to 14 and 0.9 respectively with hydration.  CA 19-9 is normal at 17 and TSH is normal. Urinalysis is clear.  Ultrasound, CT, and MRI of the abdomen:  See above.  IMPRESSION:  I am in general agreement with Dr. Gaynelle Day.  This patient presents with a nonspecific picture, primarily focused on the pancreas and suggestive of smoldering chronic pancreatitis, perhaps medication related.  I do not feel the patient needs Primaxin and have ordered that it be discontinued, during which time, we can follow his white  count.  I do feel that endoscopic ultrasound would be helpful in clarifying the patient's clinical picture, in particular, checking for the presence or absence of chronic pancreatitis.  The patient has gallstones, but I doubt that these are related to the patient's pancreatitis or current symptoms and specifically, I doubt that common duct stones are currently present or accounting for his symptoms, taking into account his normal liver chemistries, although conceivably he could have microlithiasis leading to intermittent low grade pancreatitis.  PLAN: 1. Discontinue Primaxin as discussed above and observe. 2. I will ask Dr. Vernie Ammons to review the case and if he feels it     appropriate, to arrange endoscopic ultrasound at the earliest     convenient opportunity.  We appreciate the opportunity to have seen this patient in consultation. I anticipate he will be able to have his diet advanced in the next day or two.  ______________________________ Brent Day, M.D.     RB/MEDQ  D:  03/01/2011  T:  03/02/2011  Job:  [id number]  cc:   Lovenia Kim, D.O. Fax: [phone]  Mary Sella. Andrey Campanile, MD [address] [place] [address]  Llana Aliment. Malon Kindle., M.D. Fax: [phone]  Electronically Signed by Brent Day M.D. on 04/07/2011 12:48:00 PM

## 2011-05-05 NOTE — Assessment & Plan Note (Signed)
[facility]                         ELECTROPHYSIOLOGY OFFICE NOTE   GREGOREY, NABOR                    MRN:          [id number]  DATE:10/01/2008                            DOB:          February 02, 1927    Brent Day returns today for followup.  He is a very pleasant 77-year-  old male with a history of paroxysmal AFib and rare palpitations who we  have treated with medications and strategy of rate control for the last  year or two.  He returns today for followup.  He denies chest pain.  He  denies shortness of breath.  He continued to be quite active and he  works 3 days a week.   CURRENT MEDICATIONS:  1. Gemfibrozil 600 a day.  2. Metoprolol 50 a day.  3. Flaxseed oil.  4. Multiple vitamins.  5. He is on Coumadin as directed.   On physical exam, he is a pleasant well-appearing man in no acute  distress.  Blood pressure was 140/76, pulse was 63 and regular,  respirations were 18, and weight was 183 pounds.  Neck revealed no  jugular venous distention.  Lungs are clear bilaterally to auscultation.  No wheezes, rales, or rhonchi are present.  Cardiovascular exam revealed  a regular rate and rhythm.  Normal S1-S2.  Abdominal exam was soft,  nontender, and nondistended.  There was no organomegaly.  Extremities  demonstrated no edema.   EKG demonstrates sinus rhythm with frequent PACs in a bigeminal fashion.   IMPRESSION:  1. Paroxysmal atrial fibrillation.  2. Chronic Coumadin therapy.  3. Borderline hypertension.  4. Palpitations with documented premature atrial contractions.   DISCUSSION:  Mr. Virgil is stable.  He is tolerating his AFib very  nicely.  For now, I have recommended a period of watchful waiting and  continue on his current medical therapy.  I will see him back in 1 year.     [name]. Ladona Ridgel, MD  Electronically Signed    GWT/MedQ  DD: 10/01/2008  DT: 10/02/2008  Job #: [id number]

## 2011-05-05 NOTE — Letter (Signed)
August 19, 2007    Lovenia Kim, D.O.  [address], [address]  [place], [place] [address]   RE:  ALVERTO, SHEDD  MRN:  [id number]  /  DOB:  1927/05/19   Dear Linton Rump,   Thank you for referring Mr. Kimber Esterly for EP evaluation for new  onset atrial fibrillation.  As you know, he is a very pleasant 77-year-  old man whose wife I follow also for atrial arrhythmias.  The patient  was seen in your office several weeks ago and was found to be in atrial  fibrillation though he was asymptomatic.  He is referred today for  additional evaluation.  He has been placed on beta blocker and aspirin.   EXAMINATION:  Today his exam was basically unremarkable and his ECG  demonstrates that he has in fact gone back to sinus rhythm with  occasional PACs present.   I have discussed the treatment options with Mr. Senske in detail and  have recommended that he be initiated on Coumadin secondary to his  advanced age.  He will be allowed to stop his aspirin therapy.  I have  asked that he decrease his beta blocker to 50 mg 1/2 tablet twice daily,  and finally I have asked that he undergo 2D echo to make sure there is  no obvious  abnormalities with his LV function.  Would also like to know what his  left atrial dimension is.  I will plan to see him back in the office in  a couple of months.  Thanks again for referring Mr. Carneiro for EP  evaluation and of note, he will be on Coumadin and followed in our  Coumadin clinic unless you would prefer to follow him for Coumadin in  your office.    Sincerely,      [name]. Ladona Ridgel, MD  Electronically Signed    GWT/MedQ  DD: 08/19/2007  DT: 08/21/2007  Job #: [id number]

## 2011-05-05 NOTE — Assessment & Plan Note (Signed)
[facility]                         ELECTROPHYSIOLOGY OFFICE NOTE   THOMPSON, MCKIM                    MRN:          [id number]  DATE:11/29/2007                            DOB:          1927/01/11    Mr. Ballantine returns today for followup. He is a very pleasant elderly  man with a history of paroxysmal atrial fibrillation who I saw back in  August on referral from Dr. Marisue Brooklyn. The patient has done well and  had no symptomatic atrial fibrillation. He does note that in the past  his atrial fibrillation has not been particularly symptomatic. He is on  low-dose beta blocker. He is also on Coumadin for thromboembolic  prevention. He returns today for followup and denies chest pain or  shortness of breath.   NECK:  Revealed no jugular venous distention.  LUNGS:  Clear bilaterally to auscultation. No wheezes, rales or rhonchi  were present.  CARDIOVASCULAR EXAM:  Revealed a regular rate and rhythm with a normal  S1 and S2.  EXTREMITIES:  Demonstrated no edema.   EKG demonstrated sinus rhythm with normal axis and intervals.   IMPRESSION:  1. Paroxysmal atrial fibrillation.  2. Chronic Coumadin therapy.  3. Hypertension.   DISCUSSION:  Overall, Mr. Gallo is stable. His heart has maintained in  sinus rhythm very nicely. He will continue his present medical therapy.  I do not think he is in need of antiarrhythmic drug at this time as he  has had very minimal if any symptoms from his atrial fibrillation. I  will plan to see him back in the office in 6 months.     [name]. Ladona Ridgel, MD  Electronically Signed    GWT/MedQ  DD: 11/29/2007  DT: 11/30/2007  Job #: [id number]   cc:   Lovenia Kim, D.O.

## 2011-05-05 NOTE — Assessment & Plan Note (Signed)
[facility]                         ELECTROPHYSIOLOGY OFFICE NOTE   SHEP, [name]                    MRN:          [id number]  DATE:08/19/2007                            DOB:          09/13/1927    REFERRING PHYSICIAN:  Lovenia Kim, D.O.   Brent Day is referred today by Dr. Marisue Brooklyn for evaluation of  atrial fibrillation.   HISTORY OF PRESENT ILLNESS:  The patient is a very pleasant 77 year old  man, whose health has been quite good up until now.  The patient has  never had palpitations and does not know when he is in and out of atrial  fibrillation.  He was seen by Dr. Elisabeth Most in the office several weeks  ago and, at that time, he was noted to have a pulse that was increased  and an EKG was done, which demonstrated atrial fibrillation with a rapid  ventricular response at 140 beats per minute.  He was placed on aspirin  at that point and a beta blocker and referred here for additional  evaluation.  The patient has never had syncope.  He denies palpitations.  He has no knowledge that he is out of rhythm and has no symptoms from  it.   MEDICATIONS INCLUDE:  1. Aspirin 81 mg twice daily.  2. Gemfibrozil 600 mg daily.  3. Metoprolol 50 mg daily.  4. Calcium.  5. Multivitamins.   FAMILY HISTORY:  Noncontributory at his advanced age.   SOCIAL HISTORY:  The patient is married.  He has a history of tobacco  use, but has been trying to cut back and stop smoking and is now smoking  approximately four to five cigarettes daily.  He denies alcohol abuse.   REVIEW OF SYSTEMS:  As noted in the HPI.  Otherwise, all systems were  reviewed and found to be negative, except for some very mild arthritis.   PHYSICAL EXAM:  He is a pleasant, well-appearing man, in no acute  distress.  The blood pressure was 114/64, the pulse was 76 and regular,  the respirations were 18, the weight was 182 pounds.  HEENT EXAM:  Normocephalic and  atraumatic.  Pupils equal and round.  Oropharynx is moist.  Sclerae are anicteric.  NECK:  Revealed no jugular venous distention.  There is no thyromegaly.  Trachea is midline.  Carotids are 2+ and symmetric.  LUNGS:  Clear bilaterally to auscultation, no wheezes, rales or rhonchi  were present.  There is no increased work of breathing.  CARDIOVASCULAR EXAM:  Reveals a regular rate and rhythm with normal S1  and S2.  There were no murmurs, rubs or gallops noted.  The PMI was not  enlarged nor was it laterally displaced.  ABDOMINAL EXAM:  Soft, nontender, nondistended.  There is no  organomegaly.  EXTREMITIES:  Demonstrated no cyanosis, clubbing or edema.  The pulses  were 2+ and symmetric.  NEUROLOGIC EXAM:  He was alert and oriented times three.  His cranial  nerves were intact.  Strength was 5/5 and symmetric.   The EKG today demonstrates sinus rhythm with PACs.  Prior ECG  demonstrates  atrial fibrillation with a rapid ventricular response.   IMPRESSION:  1. Paroxysmal atrial fibrillation (asymptomatic).  2. Hypertension.   DISCUSSION:  I have discussed treatment options with the patient.  Because of his advanced age, I have recommended that he undergo  initiation of Coumadin therapy.  Because he is not really feeling his A-  fib, I have recommended that he continue on his beta blocker and take 25  mg of metoprolol twice daily.  We will see him back in a couple of  months to see how he is doing.  I think prevention of thromboembolism at  this point is most important.     [name]. Ladona Ridgel, MD  Electronically Signed    GWT/MedQ  DD: 08/19/2007  DT: 08/21/2007  Job #: [id number]

## 2011-05-05 NOTE — Op Note (Signed)
NAME:  Brent Day, Brent Day NO.:  [id number]   MEDICAL RECORD NO.:  [id number]          PATIENT TYPE:  AMB   LOCATION:  SDS                          FACILITY:  [facility]   PHYSICIAN:  Alford Highland. Rankin, M.D.   DATE OF BIRTH:  1927/01/21   DATE OF PROCEDURE:  10/26/2007  DATE OF DISCHARGE:                               OPERATIVE REPORT   PREOPERATIVE DIAGNOSIS:  Rhegmatogenous retinal detachment, right eye -  macula off.   POSTOPERATIVE DIAGNOSES:  1. Rhegmatogenous retinal detachment, right eye - macula off.  2. Posterior retinal break at the equator located at the 6:30      position.   PROCEDURES:  1. Posterior vitrectomy with Endolaser panphotocoagulation for      retinopexy purposes - 25 gauge to repair retinal detachment.  2. Injection of vitreous substitute - C3F8 10%, right eye.   SURGEON:  Alford Highland. Rankin, M.D.   ANESTHESIA:  Local retrobulbar with monitored anesthesia control.   INDICATIONS FOR PROCEDURE:  The patient is a 77 year old man who has  profound visual loss in the right eye on the basis of rhegmatogenous  retinal detachment.  The patient understands this is an attempt to  reattach the retina.  He understands the risks of anesthesia including  the remote occurrence of death, loss also to the eye from the condition  as well as surgical repair including but not limited to hemorrhage,  infection, scarring, need for further surgery, no change in vision, loss  of vision, and progression of disease despite intervention.  After  appropriate signed consent was obtained, the patient taken to the  operating room.   In the operating room, appropriate monitors followed by mild sedation.  Marcaine 0.75% Marcaine 0.75% another 5 cc retrobulbar, followed by  additional 5 cc laterally in the fashion of a modified [employer]  The  right periocular region was sterilely prepped and draped in the usual  ophthalmic fashion.  The microscope was placed in position.  The  periocular region was sterilely prepped and draped.  A 25-gauge trocar  was then used to place the infusion inferotemporally.  Superior trocar  was applied.  BIOM attachment on the microscope was used for  visualization.  Core vitrectomy was then begun.  Vitreous skirt trimmed  360 degrees.  Large flap tear was identified.  Its attachment from  almost to the mid vitreous was identified.  It was amputated followed by  additional trimming of the large flap so as to prevent any residual  traction.  The vitreous base in this area was then trimmed nicely.  No  residual traction was noted.  Because the mid peripheral equator  location, it is not possible to drain all the subretinal fluid from this  location.  A small retinotomy was made inferonasal to the optic nerve.  This was atraumatic without bleeding.  Thereafter, fluid exchange  performed to remove thick subretinal fluid.  Thereafter, a fluid-air  exchange completed.  Reaspiration of the fluid was carried out on three  or four attempts with the reaccumulated fluid.  During this time,  Endolaser photocoagulation was placed around  the large retinal break as  well as in the bed of the detachment to straddle the vitreous base  region.  Finally, after final clearance of subretinal fluid posteriorly  as well as free retinal fluid.  Endolaser photocoagulation securely  placed around the retinotomy site.  The retina was nicely  attached, no complications occurred.  Septal nasal trocar removed.  An  AR-C3F8 10% exchange completed.  The superior trocar removed.  The  infusion removed.  Subconjunctival Decadron applied.  A sterile patch  and Fox shield applied.  The patient tolerated the procedure without  complication.      Alford Highland Rankin, M.D.  Electronically Signed     GAR/MEDQ  D:  10/26/2007  T:  10/26/2007  Job:  [id number]

## 2011-05-08 NOTE — Consult Note (Signed)
NAME:  Brent Day, Brent Day NO.:  [id number]   MEDICAL RECORD NO.:  [id number]                   PATIENT TYPE:  INP   LOCATION:  0483                                 FACILITY:  [facility]   PHYSICIAN:  James L. Malon Kindle., M.D.          DATE OF BIRTH:  [birth year]   DATE OF CONSULTATION:  05/17/2003  DATE OF DISCHARGE:                                   CONSULTATION   REASON FOR CONSULTATION:  Acute GI bleeding.   HISTORY OF PRESENT ILLNESS:  Healthy 77 year old gentleman with no previous  history of GI bleeding. He developed painless hematochezia yesterday that  started off as loose stools. It became progressive bloody and then passed  clots. Multiple bloody bowel movements last night and has gradually improved  this morning. It seems to, at the current time, just to be clots. He has had  no pain, no nausea or vomiting, etc. He has never had ulcers or lower GI  bleeding in the past. He is currently feeling well.   Pertinent databases revealed hemoglobin of 13.9 that has only dropped to  12.5. BUN was normal at 18.   CURRENT MEDICATIONS:  Lopid 600 mg daily, aspirin 81 mg daily, multiple  vitamins and Ativan 1 mg p.r.n.   ALLERGIES:  No known drug allergies.   PAST MEDICAL HISTORY:  He does have a history of a bladder cancer that was  removed through the cystoscope by Dr. Aldean Ast.   PAST SURGICAL HISTORY:  None other previous surgeries.   FAMILY HISTORY:  Mother died of pancreatic cancer. Father died of heart  problems. No other family history of cancer.   SOCIAL HISTORY:  Retired.  Still smokes intermittently. Works at [employer] here in town. Does not drink.   PHYSICAL EXAMINATION:  VITAL SIGNS: Not remarkable.  GENERAL: Pleasant, alert, white male in no acute distress.  HEENT: Eyes, sclerae nonicteric.  NECK: Supple with no lymphadenopathy.  LUNGS: Clear.  HEART: Regular rate and rhythm. No murmurs or gallops.  ABDOMEN: Soft and  nontender.  RECTAL: Not repeated. He had a bloody bowel movement on two rectal exams on  admission yesterday.    ASSESSMENT:  Painless hematochezia, probably due to diverticular bleed. He  does have family history of pancreatic cancer. I think at his age, a  colonoscopy certainly would be appropriate. Will try to get him on the  schedule for colonoscopy tomorrow. We have discussed the reasons for doing  this and discussed the procedure with the patient and his daughter.                                               James L. Malon Kindle., M.D.    Waldron Session  D:  05/17/2003  T:  05/17/2003  Job:  [id number]   cc:  Lovenia Kim, D.O.  [address], [address]  [place]  [place] [address]  Fax: [phone]   Lucky Cowboy, M.D.  [address], [address]  [place], [place] [address]  Fax: [phone]

## 2011-05-08 NOTE — Op Note (Signed)
NAME:  Brent Day, Brent Day NO.:  [id number]   MEDICAL RECORD NO.:  [id number]          PATIENT TYPE:  AMB   LOCATION:  NESC                         FACILITY:  [facility]   PHYSICIAN:  Courtney Paris, M.D.DATE OF BIRTH:  04/15/1927   DATE OF PROCEDURE:  DATE OF DISCHARGE:                                 OPERATIVE REPORT   PREOPERATIVE DIAGNOSIS:  Transitional cell carcinoma of the bladder.   POSTOPERATIVE DIAGNOSIS:  Transitional cell carcinoma of the bladder.   PROCEDURES:  1.  Cystourethroscopy.  2.  Transurethral resection of bladder mass x2.  3.  Bladder biopsy x1.   SURGEON:  Courtney Paris, M.D.   ASSISTANT:  Haydee Monica, M.D.   ANESTHESIA:  General endotracheal.   INDICATIONS FOR PROCEDURE:  This is a 77 year old gentleman with acute onset  of gross hematuria last week. He does have a history of superficially  noninvasive transitional cell carcinoma with his last occurrence in 1999. He  was evaluated in the office and found to have a bladder tumor on the left  lateral wall next to a diverticulum as well as a smaller one on the midline  posterior base. By reviewing his options, he has elected to proceed with  surgical management.   DESCRIPTION OF PROCEDURE:  He was identified by his wrist bracelet and  brought to the outpatient suite where he received preoperative antibiotics  and was administered general anesthesia. Next, he was prepped and draped in  the usual sterile fashion. Using a 22 French rigid cystoscopic sheath and a  12 degree lens, the patient underwent pancystourethroscopy to his anterior  and posterior urethra without mucosal abnormality. Upon entering his  bladder, his ureteral orifices were identified and noted in their normal  anatomic position effluxing clear urine bilaterally. Next, he underwent  pancystoscopy which revealed a heavily trabeculated bladder with multiple  diverticulum. Each of these diverticulum were  inspected, there was no  mucosal abnormality within them. The remainder of his bladder was inspected,  there was a small papillary based lesion. Along the midline posterior base,  there was also a larger papillary base lesion along the left lateral wall.  We next inserted a cold cup biopsy forceps and the smaller posterior midline  papillary tumor was removed. We then passed the specimen off the table for  analysis. We then addressed the larger left lateral tumor which was again  grasped with the cold cup biopsy forceps thus inverting the diverticulum it  was near. We were able to remove the entire mass with 2-3 passes of the cold  cup biopsy forceps. Next, excellent hemostasis was obtained at both sites  with the Bugbee cautery. Finally we identified a third area of mucosal  abnormality along the posterior midline base. His mucosa was somewhat  erythematous possibly consistent with CIS. Again using the cold cup biopsy  forceps, a bladder biopsy was obtained. We then obtained excellent  hemostasis using the Bugbee cautery. The bladder was drained of sterile  water and was found to be hemostatic. Next, the cystoscope was removed and  an 83 French Foley catheter was inserted, excellent  clear  urine output was obtained. A Foley catheter was then placed through a  drainage bag. The patient was reversed from his anesthesia, tolerated the  procedure well without any complications. Please note Dr. Aldean Ast was  present and participated in this entire case.       MT/MEDQ  D:  06/24/2005  T:  06/24/2005  Job:  [id number]

## 2011-05-08 NOTE — Discharge Summary (Signed)
NAME:  Brent, Day NO.:  [id number]   MEDICAL RECORD NO.:  [id number]                   PATIENT TYPE:  INP   LOCATION:  0483                                 FACILITY:  [facility]   PHYSICIAN:  James L. Malon Kindle., M.D.          DATE OF BIRTH:  07/14/1927   DATE OF ADMISSION:  05/16/2003  DATE OF DISCHARGE:  05/18/2003                                 DISCHARGE SUMMARY   REASON FOR ADMISSION:  Acute gastrointestinal bleeding.   FINAL DIAGNOSES:  1. Gastrointestinal bleeding, probably due to diverticulosis.  2. History of high cholesterol.  3. History of bladder cancer treated in 1998 by cystoscopy without any     recurrence.   Pertinent physical:  The patient had _______ with cramping.  No flank pain.  No discharge or amounts of bright blood clots.   Presented to the emergency room.  Initial work-up showed positive stools,  along with hemoglobin, white count, and coagulation profile.  Due to his age  and the acute onset of bleeding and the degree of bleeding, he was admitted.   PHYSICAL EXAMINATION:  VITAL SIGNS:  Stable.  HEART:  Normal.  LUNGS:  Normal.  ABDOMEN:  Soft and nontender.  RECTAL:  Reveals a large prostate with currant jelly stools.  For more  details, please see dictated admission history and physical.   HOSPITAL COURSE:  The patient was admitted to the hospital.  He was placed  on IV fluids.  He did not require a blood transfusion.  Initial hemoglobin  was 13.9.  By the following morning it dropped to 12.5, and to 11.9 on the  morning of his discharge.  Coagulation parameters were normal.  He had no  further bleeding.  I saw him in consultation and felt that because of his  age and his family history of pancreatic cancer he should have colonoscopy.  This was planned on 05/18/03, and it was negative, other than marked  diverticular disease, presumably the source of his bleeding.  There was no  active bleeding at the time of  colonoscopy.  It was felt that the patient  was improved and in satisfactory condition for discharge.   DISPOSITION:  The patient is discharged home.   MEDICATIONS AT THE TIME OF DISCHARGE:  1. Lopid 600 mg daily.  2. Aspirin 81 mg daily to be started in one week.  3. Citrucel or Metamucil.  4. Will also take Ativan as needed.    FOLLOW UP:  He is going to see Dr. Randa Evens back in the office in 6-8 weeks,  and is to see Dr. Elisabeth Most for usual care.                                               James L. Malon Kindle., M.D.  JLE/MEDQ  D:  05/18/2003  T:  05/19/2003  Job:  [id number]   cc:   Lovenia Kim, D.O.  [address]  [place]  [place] [address]  Fax: [phone]

## 2011-05-08 NOTE — Op Note (Signed)
   NAME:  Brent Day, KOKESH NO.:  [id number]   MEDICAL RECORD NO.:  [id number]                   PATIENT TYPE:  INP   LOCATION:  0483                                 FACILITY:  [facility]   PHYSICIAN:  James L. Malon Kindle., M.D.          DATE OF BIRTH:  03-13-1927   DATE OF PROCEDURE:  05/18/2003  DATE OF DISCHARGE:                                 OPERATIVE REPORT   PROCEDURE:  Colonoscopy.   MEDICATIONS:  1. Fentanyl 75 mcg.  2. Versed 6 mg IV.   SCOPE:  Olympus pediatric colonoscope.   INDICATION:  Acute lower bleeding.  This is done to rule out a serious  source.  She did have a family history of pancreatic cancer.   DESCRIPTION OF PROCEDURE:  The procedure had been explained to the patient  and consent obtained.  The patient in left lateral decubitus position, the  Olympus pediatric adjustable colonoscope inserted and advanced under direct  visualization.  The prep was excellent.  The patient had extensive  diverticular disease of the sigmoid colon.  After we were able to pass this  area, we were able to advance rapidly to the cecum.  The ileocecal valve was  identified, terminal ileum entered for just a short distance, was normal.  There were no signs throughout the colon of any recent or active bleeding.  The scope was withdrawn, and the cecum, ascending colon, transverse,  descending, and sigmoid colon seen well.  No polyps were seen.  No active  bleeding.  Diverticular disease at the sigmoid colon, otherwise  unremarkable.  The scope was withdrawn down in the rectum.  There were  internal hemorrhoids seen in the rectum.  No other abnormalities seen.  The  patient tolerated the procedure well.  There were no immediate  complications.   ASSESSMENT:  Lower gastrointestinal bleed, probably due to diverticulosis.   PLAN:  1. We will discharge on fiber, diverticular information.  2. We will see back in the office in 4-6 weeks.  She is instructed to  call     for signs of further bleeding.                                               James L. Malon Kindle., M.D.    Waldron Session  D:  05/18/2003  T:  05/18/2003  Job:  [id number]   cc:   Lovenia Kim, D.O.  [address]  [place]  [place] [address]  Fax: [phone]

## 2011-05-08 NOTE — H&P (Signed)
NAME:  Brent, Day NO.:  [id number]   MEDICAL RECORD NO.:  [id number]                   PATIENT TYPE:  EMS   LOCATION:  ED                                   FACILITY:  [facility]   PHYSICIAN:  Lucky Cowboy, M.D.               DATE OF BIRTH:  1927/04/30   DATE OF ADMISSION:  05/16/2003  DATE OF DISCHARGE:                                HISTORY & PHYSICAL   PATIENT PROFILE:  This is the first Brent Day admission for this very nice  77 year old married white male of Dr. Lovenia Kim for evaluation of  rectal hemorrhage.   CHIEF COMPLAINT:  Diarrhea, blood in stool.   PRESENT ILLNESS:  The patient was apparently in his usual state of good  health presenting now through the emergency room relating a history of some  six or eight hours earlier developing bloody diarrhea.  As his diarrhea  persisted, he noticed larger amounts of fresh blood and clot and, therefore,  presented to the emergency room for evaluation.  Initial workup in the  emergency room was pertinent for gross hematochezia with hematologic  parameters finding normal hemoglobin, white count, platelet count,  coagulation studies, and metabolic profile.  The patient had no premorbid  history of gastrointestinal disease or GI symptoms except as above.  He  denies any associated nausea or vomiting or significant painful cramping and  has had no fevers or chills.   MEDICATIONS:  1. Lopid 600 mg daily.  2. Baby aspirin 81 mg.  3. Vitamin E.  4. Vitamin C.  5. Calcium supplement.  6. Multivitamin.   ALLERGIES:  No known allergies.   PAST MEDICAL HISTORY:  Usual childhood illnesses.  Without history of  rheumatic fever, scarlet fever, diabetes, kidney, lung, or thyroid disease.   The patient does relate a history of hypercholesterolemia and also a history  of bladder tumor since 1998, treated without known recurrence.  Apparently  had an L2 compression fracture after a fall from a  ladder in the year 2002.   DT booster and Pneumovax in the year 2000, [facility].  Flu vaccine fall  2003.   PAST SURGICAL HISTORY:  1. In 1998, fulguration of bladder tumor, Dr. Aldean Ast, with periodic     cystoscopies, most recently two weeks ago, reported negative.  2. In 2002, bilateral cataract extraction and lens implant, Dr. Dione Booze.   FAMILY HISTORY:  Father deceased, 51, alcoholism, with heart attack.  Mother  deceased, 72 years old with pancreatic CA.  Two brothers, age 76 with  emphysema and 16 with diabetes.  Two sisters, age 61 with diverticulitis and  95 in good health.  Two sons, ages 66 and 25, and two daughters, age 75 in  good health and 50 with MS.  Four grandchildren, likewise, in good health.  Positive for diabetes, emphysema, and diverticular disease and  atherosclerotic heart disease and CA of pancreas.  Negative for  hypertension, strokes, tuberculosis, thyroid disease, and epilepsy.   SOCIAL HISTORY:  Married for 51 years.  Wife, 75 years old, reported in good  health.  A 50-year smoking history, up to one pack per day.  Right-handed.  No history of alcohol use.  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   REVIEW OF SYSTEMS:  No hearing difficulty, tinnitus, ringing, roaring,  vertigo.  No headache.  No visual complaints, diplopia, blurring, spots,  flashes, floaters.  Wearing __________glasses (post cataract extraction).  No significant sinus symptoms as drainage, sneezing, itchy or watery eyes or  nose.  No dysarthria, dysphagia, or reflux symptoms, water brash, dyspepsia,  heartburn, abdominal cramping, nausea, vomiting, constipation, or previous  episodes or symptoms of diarrhea, hematemesis, melena, or hematochezia.  Infrequent nocturia.  No dysuria or incontinence.  No significant  musculoskeletal or neurologic symptoms.   PHYSICAL  EXAMINATION:  VITAL SIGNS:  BP reported 155/83, pulse 80 and  regular, respirations not labored, temperature 97.2.  SKIN:  Clear.  Without rash, lesions, cyanosis, icterus, ecchymosis, or  petechiae, or clubbing noted.  HEENT:  Ears:  TMs normal.  Pupils full to gross confrontation.  EOM full  conjugate.  Bilateral pseudophakia with lens implants intact.  Funduscopic  reveals red reflex with optic discs flat.  Normal cupping and AV ratios.  Nasal, oropharynx clear.  Dental hygiene good.  NECK:  Supple.  Carotids, normal upstrokes.  No bruits, JVD, thyromegaly,  lymphadenopathy.  CHEST:  Clear, equal breath sounds without rales, rhonchi, wheezes.  HEART:  No lifts or thrills.  Heart sounds are soft, regular.  Without  appreciable murmurs, gallops, clicks, or rubs noted.  Pulses full  throughout.  EXTREMITIES:  No edema.  No venostasis changes or skin atrophic changes  noted.  ABDOMEN:  Soft, with bowel sounds slightly hyperactive, and no point  guarding tenderness or masses evident.  No rebound or bruits noted.  No  inguinal hernias noted.  GENITOURINARY:  Testes are unremarkable, without abnormal scrotal masses.  Genitalia appear normal.  RECTAL:  Normal anal sphincter tone.  Prostate large, 2 to 3+, smooth, firm,  with stool appearing like current jelly, grossly bloody.  No masses to  digital rectal exam.  MUSCULOSKELETAL:  General range of motion full throughout.  Muscle power,  tone, and bulk normal and symmetric.  Gait and station not tested.  Sensory,  motor, cerebellar, coordination functions normal.  NEUROLOGIC:  Grossly normal, symmetric.  Mental status normal.   LABORATORY DATA:  Hemogram:  Hemoglobin 13.9 grams percent, WBC 12,300,  normal differential, platelet count 280,000.  Protime 13.5, INR 1, PTT 31  seconds, WNL.  CMET all WNL.   IMPRESSION: 1. Rectal hemorrhage, differential rule out hemorrhoidal, diverticular, or     neoplastic or infectious etiologies.  2.  Benign prostatic hypertrophy.  3.     History of bladder tumor.  4. Hyperlipidemia.   PLAN:  Admit as per orders.                                               Lucky Cowboy, M.D.    WM/MEDQ  D:  05/17/2003  T:  05/17/2003  Job:  [id number]   cc:   Lovenia Kim,  D.O.  [address]  [place]  [place] [address]  Fax: [phone]   Everardo All. Madilyn Fireman, M.D.  [address]  [place]  [place] [address]  Fax: [phone]

## 2011-05-28 ENCOUNTER — Encounter: Payer: Self-pay | Admitting: Cardiovascular Disease

## 2011-05-28 ENCOUNTER — Encounter (INDEPENDENT_AMBULATORY_CARE_PROVIDER_SITE_OTHER): Payer: Medicare Other | Admitting: *Deleted

## 2011-05-28 DIAGNOSIS — Z950 Presence of cardiac pacemaker: Secondary | ICD-10-CM

## 2011-06-15 ENCOUNTER — Ambulatory Visit (HOSPITAL_COMMUNITY)
Admission: RE | Admit: 2011-06-15 | Discharge: 2011-06-15 | Disposition: A | Discharge status: Home / Self Care | Payer: Medicare Other | Source: Ambulatory Visit | Attending: Internal Medicine | Admitting: Internal Medicine

## 2011-06-15 ENCOUNTER — Other Ambulatory Visit (HOSPITAL_COMMUNITY): Payer: Self-pay | Admitting: Internal Medicine

## 2011-06-15 DIAGNOSIS — W19XXXA Unspecified fall, initial encounter: Secondary | ICD-10-CM

## 2011-06-15 DIAGNOSIS — R52 Pain, unspecified: Secondary | ICD-10-CM

## 2011-06-15 DIAGNOSIS — R079 Chest pain, unspecified: Secondary | ICD-10-CM | POA: Insufficient documentation

## 2011-06-15 DIAGNOSIS — S2249XA Multiple fractures of ribs, unspecified side, initial encounter for closed fracture: Secondary | ICD-10-CM | POA: Insufficient documentation

## 2011-06-16 ENCOUNTER — Ambulatory Visit (HOSPITAL_COMMUNITY)
Admission: RE | Admit: 2011-06-16 | Discharge: 2011-06-16 | Disposition: A | Discharge status: Home / Self Care | Payer: Medicare Other | Source: Ambulatory Visit | Attending: Internal Medicine | Admitting: Internal Medicine

## 2011-06-16 ENCOUNTER — Other Ambulatory Visit (HOSPITAL_COMMUNITY): Payer: Self-pay | Admitting: Internal Medicine

## 2011-06-16 DIAGNOSIS — S2239XA Fracture of one rib, unspecified side, initial encounter for closed fracture: Secondary | ICD-10-CM

## 2011-06-16 DIAGNOSIS — S2249XA Multiple fractures of ribs, unspecified side, initial encounter for closed fracture: Secondary | ICD-10-CM | POA: Insufficient documentation

## 2011-06-16 DIAGNOSIS — F17200 Nicotine dependence, unspecified, uncomplicated: Secondary | ICD-10-CM | POA: Insufficient documentation

## 2011-06-16 DIAGNOSIS — J90 Pleural effusion, not elsewhere classified: Secondary | ICD-10-CM | POA: Insufficient documentation

## 2011-06-16 DIAGNOSIS — I10 Essential (primary) hypertension: Secondary | ICD-10-CM | POA: Insufficient documentation

## 2011-06-18 ENCOUNTER — Other Ambulatory Visit (HOSPITAL_COMMUNITY): Payer: Self-pay | Admitting: Internal Medicine

## 2011-06-18 ENCOUNTER — Ambulatory Visit (HOSPITAL_COMMUNITY)
Admission: RE | Admit: 2011-06-18 | Discharge: 2011-06-18 | Disposition: A | Discharge status: Home / Self Care | Payer: Medicare Other | Source: Ambulatory Visit | Attending: Internal Medicine | Admitting: Internal Medicine

## 2011-06-18 DIAGNOSIS — J449 Chronic obstructive pulmonary disease, unspecified: Secondary | ICD-10-CM | POA: Insufficient documentation

## 2011-06-18 DIAGNOSIS — R059 Cough, unspecified: Secondary | ICD-10-CM | POA: Insufficient documentation

## 2011-06-18 DIAGNOSIS — J4489 Other specified chronic obstructive pulmonary disease: Secondary | ICD-10-CM | POA: Insufficient documentation

## 2011-06-18 DIAGNOSIS — R52 Pain, unspecified: Secondary | ICD-10-CM

## 2011-06-18 DIAGNOSIS — R05 Cough: Secondary | ICD-10-CM | POA: Insufficient documentation

## 2011-06-18 DIAGNOSIS — R079 Chest pain, unspecified: Secondary | ICD-10-CM | POA: Insufficient documentation

## 2011-06-26 ENCOUNTER — Encounter (INDEPENDENT_AMBULATORY_CARE_PROVIDER_SITE_OTHER): Payer: Medicare Other | Admitting: Surgery

## 2011-06-29 ENCOUNTER — Other Ambulatory Visit: Payer: Self-pay | Admitting: Gastroenterology

## 2011-06-29 DIAGNOSIS — K861 Other chronic pancreatitis: Secondary | ICD-10-CM

## 2011-07-01 ENCOUNTER — Ambulatory Visit
Admission: RE | Admit: 2011-07-01 | Discharge: 2011-07-01 | Disposition: A | Discharge status: Home / Self Care | Payer: Medicare Other | Source: Ambulatory Visit | Attending: Gastroenterology | Admitting: Gastroenterology

## 2011-07-01 DIAGNOSIS — K861 Other chronic pancreatitis: Secondary | ICD-10-CM

## 2011-07-01 MED ORDER — IOHEXOL 300 MG/ML  SOLN
100.0000 mL | Freq: Once | INTRAMUSCULAR | Status: AC | PRN
  Administered 2011-07-01: 100 mL via INTRAVENOUS

## 2011-07-03 ENCOUNTER — Ambulatory Visit (INDEPENDENT_AMBULATORY_CARE_PROVIDER_SITE_OTHER): Payer: Medicare Other | Admitting: Surgery

## 2011-07-03 ENCOUNTER — Encounter (INDEPENDENT_AMBULATORY_CARE_PROVIDER_SITE_OTHER): Payer: Self-pay | Admitting: Surgery

## 2011-07-03 VITALS — Temp 97.3°F

## 2011-07-03 DIAGNOSIS — K863 Pseudocyst of pancreas: Secondary | ICD-10-CM

## 2011-07-03 DIAGNOSIS — K862 Cyst of pancreas: Secondary | ICD-10-CM

## 2011-07-03 NOTE — Progress Notes (Signed)
The patient returns to clinic today. She is 3 months out from a laparoscopic cholecystectomy for gallstone pancreatitis he had a cyst it was felt to be a pseudocyst. His appetite is getting a little bit better but still an issue for him to her he denies any abdominal pain, nausea, vomiting or any other abdominal problems. His most recent CT scan of the abdomen and pelvis shows resolution of his pseudocyst .   No past medical history on file. No past surgical history on file. No current outpatient prescriptions on file.    On exam today, his abdomen is soft nontender. No masses.   Impression: Gallstone pancreatitis status post laparoscopic cholecystectomy and pseudocyst  Plan: A CT scan shows resolution of his pseudocyst. Will followup with me as needed and continue his followup with his gastroenterologist.

## 2011-07-03 NOTE — Patient Instructions (Signed)
Follow up as needed

## 2011-09-11 ENCOUNTER — Encounter (INDEPENDENT_AMBULATORY_CARE_PROVIDER_SITE_OTHER): Payer: Medicare Other | Admitting: *Deleted

## 2011-09-11 ENCOUNTER — Encounter: Payer: Self-pay | Admitting: Cardiology

## 2011-09-21 DIAGNOSIS — S72009A Fracture of unspecified part of neck of unspecified femur, initial encounter for closed fracture: Secondary | ICD-10-CM

## 2011-09-21 HISTORY — DX: Fracture of unspecified part of neck of unspecified femur, initial encounter for closed fracture: S72.009A

## 2011-09-29 LAB — CBC
MCHC: 33.9
MCV: 94.6
Platelets: 317
RBC: 4.77
RDW: 14.3 — ABNORMAL HIGH

## 2011-09-29 LAB — BASIC METABOLIC PANEL
BUN: 14
CO2: 27
Calcium: 9.9
Creatinine, Ser: 0.99
GFR calc Af Amer: >60

## 2011-09-29 LAB — PROTIME-INR
INR: 1.9 — ABNORMAL HIGH
INR: 2.1 — ABNORMAL HIGH
Prothrombin Time: 23.9 — ABNORMAL HIGH

## 2011-10-11 ENCOUNTER — Emergency Department (HOSPITAL_COMMUNITY): Payer: Medicare Other

## 2011-10-11 ENCOUNTER — Inpatient Hospital Stay (HOSPITAL_COMMUNITY)
Admission: EM | Admit: 2011-10-11 | Discharge: 2011-10-16 | DRG: 470 | Disposition: A | Discharge status: Skilled Nursing Facility | Payer: Medicare Other | Attending: Orthopaedic Surgery | Admitting: Orthopaedic Surgery

## 2011-10-11 DIAGNOSIS — I4891 Unspecified atrial fibrillation: Secondary | ICD-10-CM | POA: Diagnosis present

## 2011-10-11 DIAGNOSIS — F19921 Other psychoactive substance use, unspecified with intoxication with delirium: Secondary | ICD-10-CM | POA: Diagnosis not present

## 2011-10-11 DIAGNOSIS — S51809A Unspecified open wound of unspecified forearm, initial encounter: Secondary | ICD-10-CM | POA: Diagnosis present

## 2011-10-11 DIAGNOSIS — S72009A Fracture of unspecified part of neck of unspecified femur, initial encounter for closed fracture: Principal | ICD-10-CM | POA: Diagnosis present

## 2011-10-11 DIAGNOSIS — IMO0002 Reserved for concepts with insufficient information to code with codable children: Secondary | ICD-10-CM | POA: Diagnosis present

## 2011-10-11 DIAGNOSIS — I10 Essential (primary) hypertension: Secondary | ICD-10-CM | POA: Diagnosis present

## 2011-10-11 DIAGNOSIS — Z8551 Personal history of malignant neoplasm of bladder: Secondary | ICD-10-CM

## 2011-10-11 DIAGNOSIS — Y92009 Unspecified place in unspecified non-institutional (private) residence as the place of occurrence of the external cause: Secondary | ICD-10-CM

## 2011-10-11 DIAGNOSIS — W010XXA Fall on same level from slipping, tripping and stumbling without subsequent striking against object, initial encounter: Secondary | ICD-10-CM | POA: Diagnosis present

## 2011-10-11 LAB — DIFFERENTIAL
Eosinophils Relative: 5 % (ref 0–5)
Lymphocytes Relative: 15 % (ref 12–46)
Lymphs Abs: 1.8 10*3/uL (ref 0.7–4.0)
Monocytes Absolute: 1 10*3/uL (ref 0.1–1.0)
Monocytes Relative: 8 % (ref 3–12)
Neutro Abs: 8.7 10*3/uL — ABNORMAL HIGH (ref 1.7–7.7)

## 2011-10-11 LAB — CBC
HCT: 45.7 % (ref 39.0–52.0)
Hemoglobin: 15 g/dL (ref 13.0–17.0)
MCH: 31.7 pg (ref 26.0–34.0)
MCHC: 32.8 g/dL (ref 30.0–36.0)
MCV: 96.6 fL (ref 78.0–100.0)
RDW: 13.9 % (ref 11.5–15.5)

## 2011-10-11 LAB — BASIC METABOLIC PANEL
BUN: 24 mg/dL — ABNORMAL HIGH (ref 6–23)
Creatinine, Ser: 1.01 mg/dL (ref 0.50–1.35)
GFR calc Af Amer: 78 mL/min — ABNORMAL LOW (ref >90)
GFR calc non Af Amer: 67 mL/min — ABNORMAL LOW (ref >90)
Glucose, Bld: 105 mg/dL — ABNORMAL HIGH (ref 70–99)
Potassium: 4.1 mEq/L (ref 3.5–5.1)

## 2011-10-11 LAB — APTT: aPTT: 31 seconds (ref 24–37)

## 2011-10-12 LAB — BASIC METABOLIC PANEL
CO2: 25 mEq/L (ref 19–32)
Chloride: 102 mEq/L (ref 96–112)
Creatinine, Ser: 1.02 mg/dL (ref 0.50–1.35)
Glucose, Bld: 141 mg/dL — ABNORMAL HIGH (ref 70–99)
Sodium: 132 mEq/L — ABNORMAL LOW (ref 135–145)

## 2011-10-12 LAB — CBC
HCT: 33.9 % — ABNORMAL LOW (ref 39.0–52.0)
Hemoglobin: 11.1 g/dL — ABNORMAL LOW (ref 13.0–17.0)
MCHC: 32.7 g/dL (ref 30.0–36.0)
MCV: 97.7 fL (ref 78.0–100.0)
RDW: 13.7 % (ref 11.5–15.5)

## 2011-10-12 LAB — PROTIME-INR: INR: 1.16 (ref 0.00–1.49)

## 2011-10-13 LAB — PROTIME-INR
INR: 1.37 (ref 0.00–1.49)
Prothrombin Time: 17.1 seconds — ABNORMAL HIGH (ref 11.6–15.2)

## 2011-10-13 LAB — CBC
MCH: 31.6 pg (ref 26.0–34.0)
MCHC: 32.9 g/dL (ref 30.0–36.0)
Platelets: 188 10*3/uL (ref 150–400)
RDW: 13.7 % (ref 11.5–15.5)

## 2011-10-13 LAB — BASIC METABOLIC PANEL
Calcium: 8.5 mg/dL (ref 8.4–10.5)
GFR calc Af Amer: 75 mL/min — ABNORMAL LOW (ref >90)
GFR calc non Af Amer: 65 mL/min — ABNORMAL LOW (ref >90)
Glucose, Bld: 125 mg/dL — ABNORMAL HIGH (ref 70–99)
Sodium: 135 mEq/L (ref 135–145)

## 2011-10-13 NOTE — Op Note (Signed)
NAME:  Brent Day, GRASSEL.:  [id number]  MEDICAL RECORD NO.:  [id number]  LOCATION:  1602                         FACILITY:  [facility]  PHYSICIAN:  Vanita Panda. Magnus Ivan, M.D.DATE OF BIRTH:  08-Jan-1927  DATE OF PROCEDURE:  10/11/2011 DATE OF DISCHARGE:                              OPERATIVE REPORT   PREOPERATIVE DIAGNOSIS:  Right hip femoral neck fracture.  POSTOPERATIVE DIAGNOSIS:  Right hip femoral neck fracture.  PROCEDURE:  Right hip hemiarthroplasty.  IMPLANTS:  [place] basic Press-Fit femoral component, size 5, size 52 unipolar femoral head with +0 tapered spacer.  SURGEON:  Vanita Panda. Magnus Ivan, M.D.  ANESTHESIA:  General.  ANTIBIOTICS:  1 g IV Ancef.  BLOOD LOSS:  200 cc.  COMPLICATIONS:  None.  INDICATION:  Mr. Brent Day is an 77 year old gentleman who suffered a mechanical fall today, injured his right hip.  He sustained a right hip femoral neck fracture.  It is recommended he undergo right hip hemiarthroplasty.  The risks and benefits of this were explained to him and his family including risk of acute blood loss anemia, DVT, and PE. The risks of not fixing the hip include blood clots and pneumonia and inability to ambulate.  He does wish to proceed with surgery.  The benefits include decrease pain, increased mobility, and increase quality of life.  PROCEDURE DESCRIPTION:  After informed consent was obtained, the appropriate right hip was marked.  He was brought to the operating room, placed supine on the operating room table.  General anesthesia was then obtained.  He was then turned into a lateral decubitus position with the right operative hip up and padding on the down left knee and axillary rolls placed as well.  His right hip was then prepped and draped with DuraPrep and sterile drapes including sterile stockinette.  A time-out was called and he was identified as the correct patient and correct right hip.  I then made an  incision directly over the greater trochanter and dissected down to the iliotibial band.  The iliotibial band was then divided longitudinally and I placed a tunneling retractor.  Next we proceeded with anterolateral approach to the hip.  I took down two- thirds of gluteus medius and minimus off the greater trochanter and reflected these anteriorly.  I identified the hip capsule and divided this and large hematoma was encountered.  I then used an he oscillating saw to make the femoral neck cut.  A cord screw guide was placed in the femoral head and removed the femoral head with ease.  We then measured for size 52 and trial this hip ball and it was felt to be the correct size.  Next attention was turned to the femur.  With the leg flexed and externally rotated off the table into a leg bag, used initiating reamer and a canal finder and then we began broaching from a size #1 broach up to a size #5 broach and that was felt to be stable.  We then tried a 52 head minus or plus 0 spacer and reduced this and the acetabulum was felt to be stable with his leg lengths were equal.  I then removed all trial instrumentation and components and we  placed the real size #5 femoral component, it was followed by the real 52 head with plus 0 hip ball.  We reduced this back in the acetabulum and felt to be stable.  I then copiously irrigated the tissues with normal saline solution.  We closed the hip capsule with interrupted #1 Ethibond suture.  I reapproximated the gluteus medius and minimus and repaired these with #1 Ethibond followed by #1 Vicryl to reapproximate the IT band, 2-0 Vicryl in the subcutaneous tissue, and staples on the skin.  A well-padded sterile dressing was applied.  He was turned into the supine position.  His leg lengths were felt to be near equal.  He was awakened and extubated and taken to recovery room in stable condition.  All final counts were correct and there were no complications  noted.     Vanita Panda. Magnus Ivan, M.D.     CYB/MEDQ  D:  10/11/2011  T:  10/11/2011  Job:  [id number]  Electronically Signed by Doneen Poisson M.D. on 10/13/2011 01:17:02 PM

## 2011-10-14 LAB — BASIC METABOLIC PANEL
BUN: 16 mg/dL (ref 6–23)
GFR calc Af Amer: 87 mL/min — ABNORMAL LOW (ref >90)
GFR calc non Af Amer: 75 mL/min — ABNORMAL LOW (ref >90)
Potassium: 4.3 mEq/L (ref 3.5–5.1)

## 2011-10-14 LAB — CBC
HCT: 33.6 % — ABNORMAL LOW (ref 39.0–52.0)
MCHC: 33.3 g/dL (ref 30.0–36.0)
Platelets: 208 10*3/uL (ref 150–400)
RDW: 13.7 % (ref 11.5–15.5)

## 2011-10-14 LAB — PROTIME-INR
INR: 1.99 — ABNORMAL HIGH (ref 0.00–1.49)
Prothrombin Time: 22.9 seconds — ABNORMAL HIGH (ref 11.6–15.2)

## 2011-10-15 ENCOUNTER — Inpatient Hospital Stay (HOSPITAL_COMMUNITY): Payer: Medicare Other

## 2011-10-15 LAB — CBC
MCH: 31.6 pg (ref 26.0–34.0)
MCV: 95 fL (ref 78.0–100.0)
Platelets: 296 10*3/uL (ref 150–400)
RDW: 13.6 % (ref 11.5–15.5)
WBC: 11.7 10*3/uL — ABNORMAL HIGH (ref 4.0–10.5)

## 2011-10-15 LAB — URINALYSIS, ROUTINE W REFLEX MICROSCOPIC
Glucose, UA: NEGATIVE mg/dL
Hgb urine dipstick: NEGATIVE
Specific Gravity, Urine: 1.02 (ref 1.005–1.030)
pH: 6 (ref 5.0–8.0)

## 2011-10-15 LAB — PROTIME-INR: Prothrombin Time: 19.9 seconds — ABNORMAL HIGH (ref 11.6–15.2)

## 2011-10-15 LAB — COMPREHENSIVE METABOLIC PANEL
AST: 26 U/L (ref 0–37)
Albumin: 2.8 g/dL — ABNORMAL LOW (ref 3.5–5.2)
Calcium: 9.5 mg/dL (ref 8.4–10.5)
Creatinine, Ser: 0.94 mg/dL (ref 0.50–1.35)

## 2011-10-16 LAB — PROTIME-INR
INR: 1.81 — ABNORMAL HIGH (ref 0.00–1.49)
Prothrombin Time: 21.3 seconds — ABNORMAL HIGH (ref 11.6–15.2)

## 2011-10-18 NOTE — Discharge Summary (Signed)
  NAME:  Brent Day, HOAR.:  [id number]  MEDICAL RECORD NO.:  [id number]  LOCATION:  1602                         FACILITY:  [facility]  PHYSICIAN:  Vanita Panda. Magnus Ivan, M.D.DATE OF BIRTH:  10-11-1927  DATE OF ADMISSION:  10/11/2011 DATE OF DISCHARGE:  10/15/2011                              DISCHARGE SUMMARY   ADDENDUM:  [name] was kept an extra day due to nighttime confusion. We have halved his dose of citalopram 20 mg and halved his dose total of lorazepam that night as well.  Since he is having minimal pain, the skilled nursing facility should only give him Norco one tablet if he is having severe pain, but otherwise just Tylenol 650 mg q.6 hours p.r.n. mild pain.  The remainder of this discharge dictation has previously been dictated stands.     Vanita Panda. Magnus Ivan, M.D.     CYB/MEDQ  D:  10/15/2011  T:  10/15/2011  Job:  [id number]  Electronically Signed by Doneen Poisson M.D. on 10/18/2011 10:21:39 PM

## 2011-10-18 NOTE — Discharge Summary (Signed)
  NAME:  Brent Day, Brent Day.:  [id number]  MEDICAL RECORD NO.:  [id number]  LOCATION:  1602                         FACILITY:  [facility]  PHYSICIAN:  Vanita Panda. Magnus Ivan, M.D.DATE OF BIRTH:  1927/12/15  DATE OF ADMISSION:  10/11/2011 DATE OF DISCHARGE:  10/16/2011                              DISCHARGE SUMMARY   ADDENDUM:  We kept Mr. Engelmann in for an additional day yesterday to work up his confusion and dementia type symptoms.  I talked to the patient in length during the daytime and he would answer every question appropriately.  The family is concerned about episodes of dementia; however, he has had minimal sleep as the week has worn on, and this has contributed to sundowning and his confusion.  A urinalysis, comprehensive medical panel and a complete blood count were all normal. I did obtain a CT scan of his head which also showed no acute changes or evidence of stroke.  It had atrophy and small-vessel disease consistent with his age, but again no worrisome findings.  I talked to the family in length and recommended changing environment to a skilled nursing facility because he is still quite immobile individual and I think that would help with improving his mental status with time and he is able to get some rest as well.  I do feel that he is stable medically and surgically for transfer to short-term skilled nursing where he can still get therapy and some nursing supervision.  He should continue all the same medications as was listed before, but likely will only need Tylenol for pain.  As far as sleep, I guess they may want to contact the medical director of the skilled nursing facility to see if there is other medications that can be recommended to help an elderly like this sleep, one option certainly could be trazodone to take 50 mg p.o. at bedtime p.r.n., but I can also leave that up to the medical director as well as need be.     Vanita Panda.  Magnus Ivan, M.D.     CYB/MEDQ  D:  10/16/2011  T:  10/16/2011  Job:  [id number]  Electronically Signed by Doneen Poisson M.D. on 10/18/2011 10:21:42 PM

## 2011-10-18 NOTE — Discharge Summary (Signed)
  NAME:  Brent Day, CHITTUM.:  [id number]  MEDICAL RECORD NO.:  [id number]  LOCATION:  1602                         FACILITY:  [facility]  PHYSICIAN:  Vanita Panda. Magnus Ivan, M.D.DATE OF BIRTH:  Oct 11, 1927  DATE OF ADMISSION:  10/11/2011 DATE OF DISCHARGE:  10/14/2011                              DISCHARGE SUMMARY   ADMITTING DIAGNOSIS:  Right hip femoral neck fracture.  SECONDARY DIAGNOSES: 1. History of hypertension. 2. History of atrial fibrillation. 3. History of previous cholecystectomy. 4. History of bladder cancer. 5. History of diverticulitis.  DISCHARGE DIAGNOSIS:  Status post right hip hemiarthroplasty secondary to femoral neck fracture.  PROCEDURE:  Right hip hemiarthroplasty on October 11, 2011.  HOSPITAL COURSE:  Brent Day is an 77 year old gentleman who sustained a mechanical fall at home on the day of admission.  He was trying to get back in bed and accidentally stumbled.  He denied any syncopal episode. He was found to have a right femoral neck fracture.  He was taken to the operating room on the day of admission where he underwent successful right hip hemiarthroplasty.  He was then admitted as an inpatient to orthopedic floor and had an uneventful hospital stay.  By the day of discharge, it was felt that he would benefit from short-term skilled nursing for a continued therapy as he is being able to transition eventually back to home.  He is a [employer] information [employer] manager and does live on his own and drive, and is quite an active individual.  By the day of discharge, his incision was clean, dry, and intact.  He was afebrile with stable vital signs.  He was doing well overall.  DISPOSITION:  Discharge to skilled nursing facility.  DISCHARGE INSTRUCTIONS:  While he is at the skilled nursing facility, he can get his incision wet in the shower, starting October 16, 2011.  A dry dressing can be changed daily to his right hip incision.  Physical Therapy  can work with the patient with balance, gait training, coordination, and anterior hip precautions.  Followup appointment should be established at Dr. Eliberto Ivory office at [facility], area code [phone], in 2 weeks after discharge.  DISCHARGE MEDICATIONS: 1. Hydrocodone 5/325 one to two p.o. q.4 to 6 hours p.r.n., pain. 2. Coumadin dosed daily for a target INR of 2 to 3 for only 3 weeks. 3. Vitamin D3 of 2000 units p.o. daily. 4. Vitamin B complex 1 p.o. daily. 5. Metoprolol 50 mg 1/2 tablet p.o. b.i.d. 6. Lorazepam 1 mg 1/2 tablet daily at bedtime. 7. Gemfibrozil 600 mg p.o. b.i.d. 8. Finasteride 5 mg 1/2 tablet p.o. daily. 9. Docusate 100 mg p.o. b.i.d. p.r.n. 10.Citalopram 40 mg p.o. daily. 11.Study nasal spray which the patient has, 1 spray at bedtime.     Vanita Panda. Magnus Ivan, M.D.     CYB/MEDQ  D:  10/13/2011  T:  10/13/2011  Job:  [id number]  Electronically Signed by Doneen Poisson M.D. on 10/18/2011 10:21:37 PM

## 2012-01-14 ENCOUNTER — Encounter: Payer: Self-pay | Admitting: Internal Medicine

## 2012-02-01 ENCOUNTER — Ambulatory Visit
Admission: RE | Admit: 2012-02-01 | Discharge: 2012-02-01 | Disposition: A | Discharge status: Home / Self Care | Payer: Medicare Other | Source: Ambulatory Visit | Attending: Orthopaedic Surgery | Admitting: Orthopaedic Surgery

## 2012-02-01 ENCOUNTER — Other Ambulatory Visit: Payer: Self-pay | Admitting: Orthopaedic Surgery

## 2012-02-01 DIAGNOSIS — M25559 Pain in unspecified hip: Secondary | ICD-10-CM

## 2012-04-08 ENCOUNTER — Other Ambulatory Visit: Payer: Self-pay | Admitting: Gastroenterology

## 2012-04-08 ENCOUNTER — Ambulatory Visit
Admission: RE | Admit: 2012-04-08 | Discharge: 2012-04-08 | Disposition: A | Discharge status: Home / Self Care | Payer: Medicare Other | Source: Ambulatory Visit | Attending: Gastroenterology | Admitting: Gastroenterology

## 2012-04-08 DIAGNOSIS — R634 Abnormal weight loss: Secondary | ICD-10-CM

## 2012-04-08 DIAGNOSIS — K861 Other chronic pancreatitis: Secondary | ICD-10-CM

## 2012-04-08 DIAGNOSIS — R110 Nausea: Secondary | ICD-10-CM

## 2012-04-08 MED ORDER — IOHEXOL 300 MG/ML  SOLN
100.0000 mL | Freq: Once | INTRAMUSCULAR | Status: AC | PRN
  Administered 2012-04-08: 100 mL via INTRAVENOUS

## 2012-04-08 MED ORDER — IOHEXOL 300 MG/ML  SOLN
30.0000 mL | Freq: Once | INTRAMUSCULAR | Status: AC | PRN
  Administered 2012-04-08: 30 mL via ORAL

## 2012-06-13 ENCOUNTER — Encounter (HOSPITAL_COMMUNITY): Payer: Self-pay | Admitting: Emergency Medicine

## 2012-06-13 ENCOUNTER — Inpatient Hospital Stay (HOSPITAL_COMMUNITY)
Admission: EM | Admit: 2012-06-13 | Discharge: 2012-06-15 | DRG: 689 | Disposition: A | Discharge status: Home / Self Care | Payer: Medicare Other | Attending: Internal Medicine | Admitting: Internal Medicine

## 2012-06-13 ENCOUNTER — Emergency Department (HOSPITAL_COMMUNITY): Payer: Medicare Other

## 2012-06-13 DIAGNOSIS — R339 Retention of urine, unspecified: Secondary | ICD-10-CM | POA: Diagnosis not present

## 2012-06-13 DIAGNOSIS — I4891 Unspecified atrial fibrillation: Secondary | ICD-10-CM | POA: Diagnosis present

## 2012-06-13 DIAGNOSIS — N390 Urinary tract infection, site not specified: Principal | ICD-10-CM | POA: Diagnosis present

## 2012-06-13 DIAGNOSIS — E785 Hyperlipidemia, unspecified: Secondary | ICD-10-CM | POA: Diagnosis present

## 2012-06-13 DIAGNOSIS — B952 Enterococcus as the cause of diseases classified elsewhere: Secondary | ICD-10-CM | POA: Diagnosis present

## 2012-06-13 DIAGNOSIS — Z79899 Other long term (current) drug therapy: Secondary | ICD-10-CM

## 2012-06-13 DIAGNOSIS — I10 Essential (primary) hypertension: Secondary | ICD-10-CM | POA: Diagnosis present

## 2012-06-13 DIAGNOSIS — R5381 Other malaise: Secondary | ICD-10-CM

## 2012-06-13 DIAGNOSIS — IMO0002 Reserved for concepts with insufficient information to code with codable children: Secondary | ICD-10-CM

## 2012-06-13 DIAGNOSIS — Z7982 Long term (current) use of aspirin: Secondary | ICD-10-CM

## 2012-06-13 DIAGNOSIS — R5383 Other fatigue: Secondary | ICD-10-CM

## 2012-06-13 DIAGNOSIS — R110 Nausea: Secondary | ICD-10-CM | POA: Diagnosis present

## 2012-06-13 DIAGNOSIS — W19XXXA Unspecified fall, initial encounter: Secondary | ICD-10-CM

## 2012-06-13 DIAGNOSIS — R296 Repeated falls: Secondary | ICD-10-CM

## 2012-06-13 DIAGNOSIS — E782 Mixed hyperlipidemia: Secondary | ICD-10-CM

## 2012-06-13 DIAGNOSIS — Z87440 Personal history of urinary (tract) infections: Secondary | ICD-10-CM

## 2012-06-13 DIAGNOSIS — R627 Adult failure to thrive: Secondary | ICD-10-CM | POA: Diagnosis present

## 2012-06-13 DIAGNOSIS — N400 Enlarged prostate without lower urinary tract symptoms: Secondary | ICD-10-CM | POA: Diagnosis present

## 2012-06-13 DIAGNOSIS — R2681 Unsteadiness on feet: Secondary | ICD-10-CM

## 2012-06-13 DIAGNOSIS — I495 Sick sinus syndrome: Secondary | ICD-10-CM

## 2012-06-13 DIAGNOSIS — Z9181 History of falling: Secondary | ICD-10-CM

## 2012-06-13 DIAGNOSIS — E43 Unspecified severe protein-calorie malnutrition: Secondary | ICD-10-CM | POA: Diagnosis present

## 2012-06-13 DIAGNOSIS — E860 Dehydration: Secondary | ICD-10-CM | POA: Diagnosis present

## 2012-06-13 DIAGNOSIS — R531 Weakness: Secondary | ICD-10-CM

## 2012-06-13 DIAGNOSIS — F17200 Nicotine dependence, unspecified, uncomplicated: Secondary | ICD-10-CM | POA: Diagnosis present

## 2012-06-13 HISTORY — DX: Other disorders of lung: J98.4

## 2012-06-13 HISTORY — DX: Urinary tract infection, site not specified: N39.0

## 2012-06-13 HISTORY — DX: Unspecified fall, initial encounter: W19.XXXA

## 2012-06-13 HISTORY — DX: Malignant (primary) neoplasm, unspecified: C80.1

## 2012-06-13 HISTORY — DX: Slurred speech: R47.81

## 2012-06-13 HISTORY — DX: Neoplasm of unspecified behavior of other genitourinary organ: D49.59

## 2012-06-13 HISTORY — DX: Repeated falls: R29.6

## 2012-06-13 HISTORY — DX: Mental disorder, not otherwise specified: F99

## 2012-06-13 HISTORY — DX: Unsteadiness on feet: R26.81

## 2012-06-13 HISTORY — DX: Fracture of unspecified part of neck of unspecified femur, initial encounter for closed fracture: S72.009A

## 2012-06-13 HISTORY — DX: Fracture of nasal bones, initial encounter for closed fracture: S02.2XXA

## 2012-06-13 HISTORY — DX: Acute pancreatitis without necrosis or infection, unspecified: K85.90

## 2012-06-13 LAB — COMPREHENSIVE METABOLIC PANEL
Albumin: 3.4 g/dL — ABNORMAL LOW (ref 3.5–5.2)
Alkaline Phosphatase: 87 U/L (ref 39–117)
BUN: 28 mg/dL — ABNORMAL HIGH (ref 6–23)
Chloride: 100 mEq/L (ref 96–112)
GFR calc Af Amer: 67 mL/min — ABNORMAL LOW (ref >90)
Glucose, Bld: 108 mg/dL — ABNORMAL HIGH (ref 70–99)
Potassium: 4.3 mEq/L (ref 3.5–5.1)
Total Bilirubin: 0.4 mg/dL (ref 0.3–1.2)

## 2012-06-13 LAB — CBC
HCT: 38 % — ABNORMAL LOW (ref 39.0–52.0)
Hemoglobin: 12.7 g/dL — ABNORMAL LOW (ref 13.0–17.0)
MCHC: 33.4 g/dL (ref 30.0–36.0)
MCV: 95.2 fL (ref 78.0–100.0)
RBC: 3.99 MIL/uL — ABNORMAL LOW (ref 4.22–5.81)
RDW: 14 % (ref 11.5–15.5)
WBC: 8.4 10*3/uL (ref 4.0–10.5)

## 2012-06-13 LAB — LIPASE, BLOOD: Lipase: 48 U/L (ref 11–59)

## 2012-06-13 LAB — URINALYSIS, ROUTINE W REFLEX MICROSCOPIC
Bilirubin Urine: NEGATIVE
Nitrite: NEGATIVE
Specific Gravity, Urine: 1.019 (ref 1.005–1.030)
Urobilinogen, UA: 0.2 mg/dL (ref 0.0–1.0)

## 2012-06-13 MED ORDER — ONDANSETRON HCL 4 MG PO TABS: 4.0000 mg | ORAL_TABLET | Freq: Four times a day (QID) | ORAL | Status: DC | PRN

## 2012-06-13 MED ORDER — SODIUM CHLORIDE 0.9 % IV SOLN
INTRAVENOUS | Status: AC
  Administered 2012-06-13: 16:00:00 via INTRAVENOUS

## 2012-06-13 MED ORDER — SERTRALINE HCL 50 MG PO TABS
50.0000 mg | ORAL_TABLET | Freq: Every day | ORAL | Status: DC
  Administered 2012-06-14 – 2012-06-15 (×2): 50 mg via ORAL
  Filled 2012-06-13 (×3): qty 1

## 2012-06-13 MED ORDER — GEMFIBROZIL 600 MG PO TABS
600.0000 mg | ORAL_TABLET | Freq: Two times a day (BID) | ORAL | Status: DC
  Administered 2012-06-14 – 2012-06-15 (×3): 600 mg via ORAL
  Filled 2012-06-13 (×5): qty 1

## 2012-06-13 MED ORDER — METOPROLOL TARTRATE 50 MG PO TABS
50.0000 mg | ORAL_TABLET | Freq: Two times a day (BID) | ORAL | Status: DC
  Administered 2012-06-13: 50 mg via ORAL
  Filled 2012-06-13 (×3): qty 1

## 2012-06-13 MED ORDER — LORAZEPAM 0.5 MG PO TABS
0.5000 mg | ORAL_TABLET | Freq: Once | ORAL | Status: AC | PRN
  Administered 2012-06-13: 0.5 mg via ORAL
  Filled 2012-06-13: qty 1

## 2012-06-13 MED ORDER — SODIUM CHLORIDE 0.9 % IV SOLN
INTRAVENOUS | Status: DC
  Administered 2012-06-13 – 2012-06-14 (×2): via INTRAVENOUS

## 2012-06-13 MED ORDER — ACETAMINOPHEN 650 MG RE SUPP: 650.0000 mg | Freq: Four times a day (QID) | RECTAL | Status: DC | PRN

## 2012-06-13 MED ORDER — ASPIRIN EC 81 MG PO TBEC
81.0000 mg | DELAYED_RELEASE_TABLET | Freq: Every day | ORAL | Status: DC
  Administered 2012-06-13 – 2012-06-14 (×2): 81 mg via ORAL
  Filled 2012-06-13 (×2): qty 1

## 2012-06-13 MED ORDER — VITAMIN D 1000 UNITS PO TABS
2000.0000 [IU] | ORAL_TABLET | Freq: Every day | ORAL | Status: DC
  Administered 2012-06-13 – 2012-06-15 (×3): 2000 [IU] via ORAL
  Filled 2012-06-13 (×3): qty 2

## 2012-06-13 MED ORDER — LORATADINE 10 MG PO TABS: 10.0000 mg | ORAL_TABLET | Freq: Every day | ORAL | Status: DC

## 2012-06-13 MED ORDER — ENSURE COMPLETE PO LIQD
237.0000 mL | Freq: Three times a day (TID) | ORAL | Status: DC
  Administered 2012-06-13 – 2012-06-15 (×5): 237 mL via ORAL

## 2012-06-13 MED ORDER — ACETAMINOPHEN 325 MG PO TABS: 650.0000 mg | ORAL_TABLET | Freq: Four times a day (QID) | ORAL | Status: DC | PRN

## 2012-06-13 MED ORDER — DEXTROSE 5 % IV SOLN
1.0000 g | INTRAVENOUS | Status: DC
  Administered 2012-06-13: 1 g via INTRAVENOUS
  Filled 2012-06-13 (×2): qty 10

## 2012-06-13 MED ORDER — DEXTROSE 5 % IV SOLN: 1.0000 g | INTRAVENOUS | Status: DC

## 2012-06-13 MED ORDER — B COMPLEX-C PO TABS
1.0000 | ORAL_TABLET | Freq: Every day | ORAL | Status: DC
  Administered 2012-06-13 – 2012-06-15 (×3): 1 via ORAL
  Filled 2012-06-13 (×3): qty 1

## 2012-06-13 MED ORDER — ONDANSETRON HCL 4 MG/2ML IJ SOLN
4.0000 mg | Freq: Four times a day (QID) | INTRAMUSCULAR | Status: DC | PRN
  Administered 2012-06-13: 4 mg via INTRAVENOUS
  Filled 2012-06-13: qty 2

## 2012-06-13 MED ORDER — FINASTERIDE 5 MG PO TABS
5.0000 mg | ORAL_TABLET | Freq: Every day | ORAL | Status: DC
  Administered 2012-06-14 – 2012-06-15 (×2): 5 mg via ORAL
  Filled 2012-06-13 (×3): qty 1

## 2012-06-13 NOTE — ED Notes (Signed)
Daughter reported that pt is on Ativan and in the last couple of days has doubled his dosage due to increased anxiety.

## 2012-06-13 NOTE — ED Provider Notes (Addendum)
History     CSN: [id number]  Arrival date & time 06/13/12  1214   First MD Initiated Contact with Patient 06/13/12 1336      Chief Complaint  Patient presents with  . Nausea    (Consider location/radiation/quality/duration/timing/severity/associated sxs/prior treatment) The history is provided by the patient and a relative.   Pt with poor appetite, nausea for past 2 weeks, in past couple days seems generally weak. Had a near fall earlier, caught self, denies injury. Denies vomiting or diarrhea. No abd pain. Denies fever or chills. Pt denies any fall or head injury. No syncope. Denies headache. Rare cough, non productive. No sore throat. No fever or chills. No chest pain or discomfort. No sob. No abd pain. No gu c/o. No rash. Denies any recent change in meds or new meds except has been on two courses of abx for uti in past month, and was called today and told uti still present, was to have new abx called in but hasnt started yet.      Past Medical History  Diagnosis Date  . Pancreatitis   . Fractured hip 09/2011  . UTI (urinary tract infection)     Past Surgical History  Procedure Date  . Cholecystectomy   . Partial hip arthroplasty     No family history on file.  History  Substance Use Topics  . Smoking status: Never Smoker   . Smokeless tobacco: Not on file  . Alcohol Use: No      Review of Systems  Constitutional: Negative for fever and chills.  HENT: Negative for neck pain.   Eyes: Negative for visual disturbance.  Respiratory: Negative for shortness of breath.   Cardiovascular: Negative for chest pain.  Gastrointestinal: Negative for abdominal pain.  Genitourinary: Negative for flank pain.  Musculoskeletal: Negative for back pain.  Skin: Negative for rash.  Neurological: Negative for numbness and headaches.  Hematological: Does not bruise/bleed easily.  Psychiatric/Behavioral: Negative for confusion.    Allergies  Review of patient's allergies  indicates no known allergies.  Home Medications   Current Outpatient Rx  Name Route Sig Dispense Refill  . ASPIRIN 81 MG PO TABS Oral Take 81 mg by mouth daily.    . B COMPLEX PO TABS Oral Take 1 tablet by mouth daily.    Marland Kitchen VITAMIN D 2000 UNITS PO CAPS Oral Take 1 capsule by mouth daily.    Marland Kitchen FINASTERIDE 5 MG PO TABS Oral Take 5 mg by mouth daily.    Marland Kitchen GEMFIBROZIL 600 MG PO TABS Oral Take 600 mg by mouth 2 (two) times daily before a meal.    . METOPROLOL TARTRATE 50 MG PO TABS Oral Take 50 mg by mouth 2 (two) times daily.    . SERTRALINE HCL 100 MG PO TABS Oral Take 50 mg by mouth daily.    . SULFAMETHOXAZOLE-TMP DS 800-160 MG PO TABS Oral Take 1 tablet by mouth once.      BP 143/65  Pulse 89  Temp 98.4 F (36.9 C) (Oral)  Resp 18  SpO2 98%  Physical Exam  Nursing note and vitals reviewed. Constitutional: He appears well-developed and well-nourished. No distress.  HENT:  Nose: Nose normal.  Mouth/Throat: Oropharynx is clear and moist.  Eyes: Conjunctivae are normal. Pupils are equal, round, and reactive to light. No scleral icterus.  Neck: Neck supple. No tracheal deviation present.       No stiffness or rigidity  Cardiovascular: Normal rate, regular rhythm, normal heart sounds and intact distal  pulses.   Pulmonary/Chest: Effort normal and breath sounds normal. No accessory muscle usage. No respiratory distress.  Abdominal: Soft. Bowel sounds are normal. He exhibits no distension.  Genitourinary:       No cva tenderness  Musculoskeletal: Normal range of motion. He exhibits no edema and no tenderness.  Neurological: He is alert. No cranial nerve deficit.       Alert, oriented to person/place. Mental status at baseline per family. Speech fluent.  Motor intact bil.   Skin: Skin is warm and dry.  Psychiatric: He has a normal mood and affect.    ED Course  Procedures (including critical care time)   Labs Reviewed  URINALYSIS, ROUTINE W REFLEX MICROSCOPIC  CBC    COMPREHENSIVE METABOLIC PANEL  LIPASE, BLOOD    Results for orders placed during the hospital encounter of 06/13/12  URINALYSIS, ROUTINE W REFLEX MICROSCOPIC      Component Value Range   Color, Urine YELLOW  YELLOW   APPearance CLOUDY (*) CLEAR   Specific Gravity, Urine 1.019  1.005 - 1.030   pH 6.5  5.0 - 8.0   Glucose, UA NEGATIVE  NEGATIVE mg/dL   Hgb urine dipstick MODERATE (*) NEGATIVE   Bilirubin Urine NEGATIVE  NEGATIVE   Ketones, ur NEGATIVE  NEGATIVE mg/dL   Protein, ur 30 (*) NEGATIVE mg/dL   Urobilinogen, UA 0.2  0.0 - 1.0 mg/dL   Nitrite NEGATIVE  NEGATIVE   Leukocytes, UA MODERATE (*) NEGATIVE  CBC      Component Value Range   WBC 8.4  4.0 - 10.5 K/uL   RBC 3.99 (*) 4.22 - 5.81 MIL/uL   Hemoglobin 12.7 (*) 13.0 - 17.0 g/dL   HCT 14.7 (*) 82.9 - 56.2 %   MCV 95.2  78.0 - 100.0 fL   MCH 31.8  26.0 - 34.0 pg   MCHC 33.4  30.0 - 36.0 g/dL   RDW 13.0  86.5 - 78.4 %   Platelets 305  150 - 400 K/uL  COMPREHENSIVE METABOLIC PANEL      Component Value Range   Sodium 136  135 - 145 mEq/L   Potassium 4.3  3.5 - 5.1 mEq/L   Chloride 100  96 - 112 mEq/L   CO2 23  19 - 32 mEq/L   Glucose, Bld 108 (*) 70 - 99 mg/dL   BUN 28 (*) 6 - 23 mg/dL   Creatinine, Ser 6.96  0.50 - 1.35 mg/dL   Calcium 9.8  8.4 - 29.5 mg/dL   Total Protein 7.9  6.0 - 8.3 g/dL   Albumin 3.4 (*) 3.5 - 5.2 g/dL   AST 18  0 - 37 U/L   ALT 10  0 - 53 U/L   Alkaline Phosphatase 87  39 - 117 U/L   Total Bilirubin 0.4  0.3 - 1.2 mg/dL   GFR calc non Af Amer 58 (*) >90 mL/min   GFR calc Af Amer 67 (*) >90 mL/min  LIPASE, BLOOD      Component Value Range   Lipase 48  11 - 59 U/L  URINE MICROSCOPIC-ADD ON      Component Value Range   WBC, UA 11-20  <3 WBC/hpf   RBC / HPF 3-6  <3 RBC/hpf   Bacteria, UA MANY (*) RARE       MDM  Iv ns bolus. zofran iv.   Urine culture sent.   Rocephin iv.  Pt w generalized weakness, nausea, poor po intake, dehydration, persistent uti despite recent outpt  rx, therefore will admit for ivf, iv abx, and f/u of culture results.    [facility] place in obs status to team 5, gen med bed      Suzi Roots, MD 06/13/12 1518  Suzi Roots, MD 06/13/12 [phone]

## 2012-06-13 NOTE — ED Notes (Addendum)
Nausea started about 4 weeks ago per daughter and got worst in the last week. Pt has hx of pancreatitis. No vomiting or diarrhea. Dry heaves and inability to eat due to nausea. Pt has fallen twice in the last week.

## 2012-06-13 NOTE — Progress Notes (Signed)
ED Cm noted Cm consult CM spoke with male family member about cm consult. Has not used a home health agency at this time and wanting to review list of [place] agencies prior to making decision on home health agency, also pending MD orders

## 2012-06-13 NOTE — ED Notes (Signed)
Family reports to window RN that "yesterday dad had a little slurred speech and shaking that he has never had before, and dr, Margarita Grizzle is sure that he has cancer in his prostate he has a large tumor there, he has been very weak and we are just concerned about him."

## 2012-06-13 NOTE — H&P (Signed)
PCP:   [name], MD   Chief Complaint:  Generalized weakness, nausea, decreased appetite and weight loss  HPI: 78 year old male with a past medical history significant for hypertension, hyperlipidemia, remote history of pancreatitis, prostate mass and recurrent UTIs; came to the hospital complaining of generalized weakness, nausea, decreased appetite and weight loss. The symptoms has been present for the last couple of weeks and in fact just worsening steadily. Patient has been told by PCP that the most recent urinalysis/culture done in the office demonstrated that he has no clear day urinary tract infection for what he has been using antibiotics. At that moment and because of the symptoms that he had that are worsening patient came to the emergency department for further evaluation and treatment. Patient denies any fever, chills, abdominal pain, diarrhea, vomiting, hematemesis/melena, chest pain, shortness of breath or cough. In the ED a chest x-ray demonstrated no acute cardiopulmonary process, and just chronic bronchitic changes; he had a UTI positive for leukocyte esterase but no nitrite and 11-20 wbc's. [facility] has been called to examine the patient and provide further evaluation and treatment for his symptoms.  Allergies:  No Known Allergies    Past Medical History  Diagnosis Date  . Pancreatitis   . Fractured hip 09/2011  . UTI (urinary tract infection)   . Prostate tumor     to have bx by Dr. Margarita Grizzle  . UTI (lower urinary tract infection) 06/13/2012    has had x 3 months  . Falls 06/13/12    hit head approx. 1 month ago  . Unsteady gait 06/13/2012    "bad for awhile but getting worse recently"  . Slurred speech     slurred speech on 06/12/12 - didn't last long, slower talking recently  . Cancer     bladder  . Lung abnormality     spot on ct approx 3 months ago - to be followed - [facility]  . 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 Nose  fracture     after a fall - Oct. 2012    Past Surgical History  Procedure Date  . Cholecystectomy   . Partial hip arthroplasty   . Cystoscopy     has yearly due to bladder cancer  . Retinal detachment surgery     at least 3 and have been in both eyes    Prior to Admission medications   Medication Sig Start Date End Date Taking? Authorizing Provider  aspirin 81 MG tablet Take 81 mg by mouth daily.   Yes Historical Provider, MD  b complex vitamins tablet Take 1 tablet by mouth daily.   Yes Historical Provider, MD  Cholecalciferol (VITAMIN D) 2000 UNITS CAPS Take 1 capsule by mouth daily.   Yes Historical Provider, MD  finasteride (PROSCAR) 5 MG tablet Take 5 mg by mouth daily.   Yes Historical Provider, MD  gemfibrozil (LOPID) 600 MG tablet Take 600 mg by mouth 2 (two) times daily before a meal.   Yes Historical Provider, MD  metoprolol (LOPRESSOR) 50 MG tablet Take 50 mg by mouth 2 (two) times daily.   Yes Historical Provider, MD  sertraline (ZOLOFT) 100 MG tablet Take 50 mg by mouth daily.   Yes Historical Provider, MD  sulfamethoxazole-trimethoprim (BACTRIM DS) 800-160 MG per tablet Take 1 tablet by mouth once.   Yes Historical Provider, MD    Social History:  reports that he has been smoking.  He has never used smokeless  tobacco. He reports that he does not drink alcohol or use illicit drugs.  History reviewed. No pertinent family history.  Review of Systems:  Negative except as mentioned on history of present illness.   Physical Exam: Blood pressure 144/59, pulse 100, temperature 98.5 F (36.9 C), temperature source Oral, resp. rate 18, SpO2 97.00%. Gen.: In no acute distress, cachectic in appearance; alert, awake and oriented x3.  HENT:  Nose: Nose normal.  Mouth/Throat: Oropharynx is clear, no exudate or erythema; dry mucous membranes.   Eyes: Conjunctivae are normal. Pupils are equal, round, and reactive to light. No scleral icterus.  Neck: Neck supple. No tracheal  deviation present.  Cardiovascular: S1 and S2, mild tachycardia, no murmurs.  Pulmonary/Chest: Effort normal and breath sounds normal. No accessory muscle usage. No respiratory distress.  Abdominal: Soft. Bowel sounds are normal. He exhibits no distension.  Genitourinary: No cva tenderness  Musculoskeletal: Normal range of motion. He exhibits no edema and no tenderness.  Neurological: He is alert. No cranial nerve deficit. Alert, oriented to person/place. Mental status at baseline per family. Speech fluent. Motor intact bil.  Skin: Skin is warm and dry.    Labs on Admission:  Results for orders placed during the hospital encounter of 06/13/12 (from the past 48 hour(s))  URINALYSIS, ROUTINE W REFLEX MICROSCOPIC     Status: Abnormal   Collection Time   06/13/12  2:10 PM      Component Value Range Comment   Color, Urine YELLOW  YELLOW    APPearance CLOUDY (*) CLEAR    Specific Gravity, Urine 1.019  1.005 - 1.030    pH 6.5  5.0 - 8.0    Glucose, UA NEGATIVE  NEGATIVE mg/dL    Hgb urine dipstick MODERATE (*) NEGATIVE    Bilirubin Urine NEGATIVE  NEGATIVE    Ketones, ur NEGATIVE  NEGATIVE mg/dL    Protein, ur 30 (*) NEGATIVE mg/dL    Urobilinogen, UA 0.2  0.0 - 1.0 mg/dL    Nitrite NEGATIVE  NEGATIVE    Leukocytes, UA MODERATE (*) NEGATIVE   URINE MICROSCOPIC-ADD ON     Status: Abnormal   Collection Time   06/13/12  2:10 PM      Component Value Range Comment   WBC, UA 11-20  <3 WBC/hpf    RBC / HPF 3-6  <3 RBC/hpf    Bacteria, UA MANY (*) RARE   CBC     Status: Abnormal   Collection Time   06/13/12  2:15 PM      Component Value Range Comment   WBC 8.4  4.0 - 10.5 K/uL    RBC 3.99 (*) 4.22 - 5.81 MIL/uL    Hemoglobin 12.7 (*) 13.0 - 17.0 g/dL    HCT 16.1 (*) 09.6 - 52.0 %    MCV 95.2  78.0 - 100.0 fL    MCH 31.8  26.0 - 34.0 pg    MCHC 33.4  30.0 - 36.0 g/dL    RDW 04.5  40.9 - 81.1 %    Platelets 305  150 - 400 K/uL   COMPREHENSIVE METABOLIC PANEL     Status: Abnormal    Collection Time   06/13/12  2:15 PM      Component Value Range Comment   Sodium 136  135 - 145 mEq/L    Potassium 4.3  3.5 - 5.1 mEq/L    Chloride 100  96 - 112 mEq/L    CO2 23  19 - 32 mEq/L  Glucose, Bld 108 (*) 70 - 99 mg/dL    BUN 28 (*) 6 - 23 mg/dL    Creatinine, Ser 1.61  0.50 - 1.35 mg/dL    Calcium 9.8  8.4 - 09.6 mg/dL    Total Protein 7.9  6.0 - 8.3 g/dL    Albumin 3.4 (*) 3.5 - 5.2 g/dL    AST 18  0 - 37 U/L    ALT 10  0 - 53 U/L    Alkaline Phosphatase 87  39 - 117 U/L    Total Bilirubin 0.4  0.3 - 1.2 mg/dL    GFR calc non Af Amer 58 (*) >90 mL/min    GFR calc Af Amer 67 (*) >90 mL/min   LIPASE, BLOOD     Status: Normal   Collection Time   06/13/12  2:15 PM      Component Value Range Comment   Lipase 48  11 - 59 U/L     Radiological Exams on Admission: Dg Chest 2 View  06/13/2012  *RADIOLOGY REPORT*  Clinical Data: Weakness, decreased appetite, smoker, pancreatitis  CHEST - 2 VIEW  Comparison: 10/11/2011  Findings: Stable heart size and vascularity.  Negative for pneumonia, collapse, consolidation, effusion or pneumothorax. Stable COPD changes/hyperinflation with parenchymal scarring. Degenerative changes of the spine.  IMPRESSION: Stable COPD without acute focal chest process  Original Report Authenticated By: Judie Petit. Ruel Favors, M.D.     Assessment/Plan 1- Generalized weakness: Progressive problem with associated failure to thrive and decreased appetite. Patient has been diagnosed with a mass in his prostate now will require biopsy to rule out cancer. Has also had recurrent UTIs  And in fact was told by PCP he has failed outpatient tx for most recent UTI. He will be admitted to regular bed, urine cx, empiric rocephin, IVF's, PRN antiemetics; will check TSH, B12, Vit D and will also request PT eval and treatment.  2-Essential hypertension, benign: stable continue current regimen.  3-Atrial fibrillation: Continue beta blocker, no candidate for anticoagulation. Patient  on aspirin (Which will be continued).  4-severe protein calorie malnutrition and FTT (failure to thrive) in adult: Will encourage by mouth intake, will start ensure 3 times a day.  5-Frequent falls: Most likely associated with declining health status and deconditioning. Will check vitamin D level as another cause for frequent falls in elderly. Will follow results and ask physical therapy to evaluate.  6-Dehydration: Will give IV fluids and follow orthostatic vs  7-Nausea: PRN antiemetics.  8-BPH: continue proscar  9-presumed UTI: Urine culture has been order; will continue empirical treatment with Rocephin.  10-prostate mass: As mentioned above patient is planning to follow with his urologist in order to have a biopsy done to assess origin and further treatment for this problem.  Time Spent on Admission: 45 minutes Matilynn Dacey [facility] [phone]  06/13/2012, 5:58 PM

## 2012-06-13 NOTE — ED Notes (Signed)
MD at bedside. 

## 2012-06-13 NOTE — ED Notes (Signed)
Patient transported to X-ray 

## 2012-06-14 DIAGNOSIS — R5383 Other fatigue: Secondary | ICD-10-CM

## 2012-06-14 DIAGNOSIS — R110 Nausea: Secondary | ICD-10-CM

## 2012-06-14 DIAGNOSIS — E860 Dehydration: Secondary | ICD-10-CM

## 2012-06-14 DIAGNOSIS — E782 Mixed hyperlipidemia: Secondary | ICD-10-CM

## 2012-06-14 DIAGNOSIS — R5381 Other malaise: Secondary | ICD-10-CM

## 2012-06-14 LAB — BASIC METABOLIC PANEL
CO2: 22 mEq/L (ref 19–32)
GFR calc non Af Amer: 67 mL/min — ABNORMAL LOW (ref >90)
Glucose, Bld: 89 mg/dL (ref 70–99)
Potassium: 3.9 mEq/L (ref 3.5–5.1)
Sodium: 139 mEq/L (ref 135–145)

## 2012-06-14 LAB — URINE CULTURE: Colony Count: 65000

## 2012-06-14 LAB — CBC
Hemoglobin: 11 g/dL — ABNORMAL LOW (ref 13.0–17.0)
MCH: 30.9 pg (ref 26.0–34.0)
RBC: 3.56 MIL/uL — ABNORMAL LOW (ref 4.22–5.81)
WBC: 8.7 10*3/uL (ref 4.0–10.5)

## 2012-06-14 LAB — LIPID PANEL
Cholesterol: 111 mg/dL (ref 0–200)
Total CHOL/HDL Ratio: 3 RATIO
Triglycerides: 55 mg/dL (ref <150)
VLDL: 11 mg/dL (ref 0–40)

## 2012-06-14 MED ORDER — MEGESTROL ACETATE 400 MG/10ML PO SUSP
400.0000 mg | Freq: Every day | ORAL | Status: DC
  Administered 2012-06-14 – 2012-06-15 (×2): 400 mg via ORAL
  Filled 2012-06-14 (×2): qty 10

## 2012-06-14 MED ORDER — METOPROLOL SUCCINATE ER 25 MG PO TB24
25.0000 mg | ORAL_TABLET | Freq: Every day | ORAL | Status: DC
  Administered 2012-06-14: 25 mg via ORAL
  Filled 2012-06-14 (×2): qty 1

## 2012-06-14 MED ORDER — ASPIRIN EC 81 MG PO TBEC
162.0000 mg | DELAYED_RELEASE_TABLET | Freq: Every day | ORAL | Status: DC
  Administered 2012-06-15: 162 mg via ORAL
  Filled 2012-06-14: qty 2

## 2012-06-14 MED ORDER — SODIUM CHLORIDE 0.9 % IV SOLN
1.0000 g | Freq: Four times a day (QID) | INTRAVENOUS | Status: DC
  Administered 2012-06-14 – 2012-06-15 (×5): 1 g via INTRAVENOUS
  Filled 2012-06-14 (×7): qty 1000

## 2012-06-14 MED ORDER — LORAZEPAM 2 MG/ML IJ SOLN
1.0000 mg | Freq: Two times a day (BID) | INTRAMUSCULAR | Status: DC | PRN
  Administered 2012-06-14: 1 mg via INTRAVENOUS
  Filled 2012-06-14: qty 1

## 2012-06-14 NOTE — Progress Notes (Signed)
Subjective: Denies any nausea, abdominal pain, chest pain or shortness of breath. Patient remains afebrile and reports feeling better.  Objective: Vital signs in last 24 hours: Temp:  [98 F (36.7 C)-98.7 F (37.1 C)] 98 F (36.7 C) (06/25 1412) Pulse Rate:  [65-100] 65  (06/25 1412) Resp:  [18-20] 18  (06/25 1412) BP: (112-144)/(49-72) 112/49 mmHg (06/25 1412) SpO2:  [95 %-97 %] 97 % (06/25 1412) Weight:  [75.751 kg (167 lb)] 75.751 kg (167 lb) (06/25 0645) Weight change:     Intake/Output from previous day: 06/24 0701 - 06/25 0700 In: 815 [I.V.:815] Out: 400 [Urine:400] Total I/O In: 360 [P.O.:360] Out: 275 [Urine:275]   Physical Exam: General: Alert, awake, oriented x3, in no acute distress. HEENT: No bruits, no goiter. Heart: S1 and S2, no murmurs, no rubs or gallops.  Lungs: Clear to auscultation bilaterally. Abdomen: Soft, nontender, nondistended, positive bowel sounds. Extremities: No clubbing, cyanosis or edema with positive pedal pulses. Neuro: Grossly intact, nonfocal.  Lab Results: Basic Metabolic Panel:  Basename 06/14/12 0435 06/13/12 1930 06/13/12 1415  NA 139 -- 136  K 3.9 -- 4.3  CL 108 -- 100  CO2 22 -- 23  GLUCOSE 89 -- 108*  BUN 23 -- 28*  CREATININE 1.00 -- 1.14  CALCIUM 8.9 -- 9.8  MG -- 2.1 --  PHOS -- -- --   Liver Function Tests:  Basename 06/13/12 1415  AST 18  ALT 10  ALKPHOS 87  BILITOT 0.4  PROT 7.9  ALBUMIN 3.4*    Basename 06/13/12 1415  LIPASE 48  AMYLASE --   CBC:  Basename 06/14/12 0435 06/13/12 1415  WBC 8.7 8.4  NEUTROABS -- --  HGB 11.0* 12.7*  HCT 34.2* 38.0*  MCV 96.1 95.2  PLT 288 305   Fasting Lipid Panel:  Basename 06/14/12 0435  CHOL 111  HDL 37*  LDLCALC 63  TRIG 55  CHOLHDL 3.0  LDLDIRECT --   Thyroid Function Tests:  Basename 06/13/12 1930  TSH 1.244  T4TOTAL --  FREET4 --  T3FREE --  THYROIDAB --   Anemia Panel:  Basename 06/13/12 1930  VITAMINB12 486  FOLATE --  FERRITIN  --  TIBC --  IRON --  RETICCTPCT --   Urinalysis:  Basename 06/13/12 1410  COLORURINE YELLOW  LABSPEC 1.019  PHURINE 6.5  GLUCOSEU NEGATIVE  HGBUR MODERATE*  BILIRUBINUR NEGATIVE  KETONESUR NEGATIVE  PROTEINUR 30*  UROBILINOGEN 0.2  NITRITE NEGATIVE  LEUKOCYTESUR MODERATE*   Misc. Labs:  Recent Results (from the past 240 hour(s))  URINE CULTURE     Status: Normal (Preliminary result)   Collection Time   06/13/12  2:58 PM      Component Value Range Status Comment   Specimen Description URINE, CLEAN CATCH   Final    Special Requests NONE   Final    Culture  Setup Time [id number]   Final    Colony Count 65,000 COLONIES/ML   Final    Culture ENTEROCOCCUS SPECIES   Final    Report Status PENDING   Incomplete     Studies/Results: Dg Chest 2 View  06/13/2012  *RADIOLOGY REPORT*  Clinical Data: Weakness, decreased appetite, smoker, pancreatitis  CHEST - 2 VIEW  Comparison: 10/11/2011  Findings: Stable heart size and vascularity.  Negative for pneumonia, collapse, consolidation, effusion or pneumothorax. Stable COPD changes/hyperinflation with parenchymal scarring. Degenerative changes of the spine.  IMPRESSION: Stable COPD without acute focal chest process  Original Report Authenticated By: Judie Petit. TREVOR SHICK, M.D.  Medications: Scheduled Meds:   . sodium chloride   Intravenous STAT  . ampicillin (OMNIPEN) IV  1 g Intravenous Q6H  . aspirin EC  162 mg Oral Daily  . B-complex with vitamin C  1 tablet Oral Daily  . cholecalciferol  2,000 Units Oral Daily  . feeding supplement  237 mL Oral TID BM  . finasteride  5 mg Oral Daily  . gemfibrozil  600 mg Oral BID AC  . megestrol  400 mg Oral Daily  . metoprolol succinate  25 mg Oral QHS  . sertraline  50 mg Oral Daily  . DISCONTD: aspirin EC  81 mg Oral Daily  . DISCONTD: cefTRIAXone (ROCEPHIN)  IV  1 g Intravenous Q24H  . DISCONTD: cefTRIAXone (ROCEPHIN)  IV  1 g Intravenous Q24H  . DISCONTD: loratadine  10 mg Oral Daily    . DISCONTD: metoprolol  50 mg Oral BID   Continuous Infusions:   . sodium chloride 75 mL/hr at 06/14/12 1207   PRN Meds:.acetaminophen, acetaminophen, LORazepam, LORazepam, ondansetron (ZOFRAN) IV, ondansetron  Assessment/Plan: 1- Generalized weakness: Progressive problem with associated failure to thrive and decreased appetite. Patient has been diagnosed with a mass in his prostate now will require biopsy to rule out cancer. Has also had recurrent UTIs And in fact was told by PCP he has failed outpatient tx for most recent UTI. We'll continue supportive care, follow PT recommendations for a safe discharge; treat enterococcus UTI with ampicillin and follow culture taken on the moment of admission for comparison and adjustment to antibiotic regimen. B12 within normal limits, TSH within normal limits and vitamin D pending at this point.  2-Essential hypertension, benign: stable continue current regimen.   3-Atrial fibrillation: Continue beta blocker, no candidate for anticoagulation. Continue aspirin.   4-severe protein calorie malnutrition and FTT (failure to thrive) in adult: Will encourage by mouth intake, will continue ensure 3 times a day and will start Megace to increase his appetite.   5-Frequent falls: Most likely associated with declining health status and deconditioning. Will follow results of vitamin D level; B12 and TSH within normal limits. Will follow recommendations from physical therapy regarding discharge plans.   6-Dehydration: No orthostatics his. Will adjust IV fluids to [facility]. Patient is eating and drinking a little better.  7-Nausea: Better; continue PRN antiemetics.   8-BPH: continue proscar   9-Enterococcus UTI: Cold sores and sensitivity retrieved from PCP office; in house urine culture is still pending. Will change antibiotics to ampicillin IV and follow symptoms response. Continue supportive care.    10-prostate mass: As mentioned above patient is planning to  follow with his urologist in order to have a biopsy done to assess origin and further treatment for this problem.    Disposition: Wanted a physical therapy evaluation for safe discharge plans, follow urine culture and adjust antibiotic therapy if needed. Most likely home versus a skilled facility if required. 1-2 days      LOS: 1 day   Dorris Pierre [facility] [phone]  06/14/2012, 2:15 PM

## 2012-06-14 NOTE — Progress Notes (Signed)
Pt voided at 9:23am & then bladder scanned post residual void.  Pt voided at 11:50am 80ml & then bladder scanned post residual void.  Urologist ordered 18 french foley.  NT placed foley about 1500 when received from materials.  Pt tolerated fairly.

## 2012-06-14 NOTE — Progress Notes (Signed)
INITIAL ADULT NUTRITION ASSESSMENT Date: 06/14/2012   Time: 3:04 PM  Reason for Assessment: Nutrition risk, weight loss, patient looks malnourished  ASSESSMENT: Male 78 y.o.  Dx: Weakness, FTT  Hx:  Past Medical History  Diagnosis Date  . Pancreatitis   . Fractured hip 09/2011  . UTI (urinary tract infection)   . Prostate tumor     to have bx by Dr. Margarita Grizzle  . UTI (lower urinary tract infection) 06/13/2012    has had x 3 months  . Falls 06/13/12    hit head approx. 1 month ago  . Unsteady gait 06/13/2012    "bad for awhile but getting worse recently"  . Slurred speech     slurred speech on 06/12/12 - didn't last long, slower talking recently  . Cancer     bladder  . Lung abnormality     spot on ct approx 3 months ago - to be followed - [facility]  . 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 Nose fracture     after a fall - Oct. 2012   Related Meds:     . sodium chloride   Intravenous STAT  . ampicillin (OMNIPEN) IV  1 g Intravenous Q6H  . aspirin EC  162 mg Oral Daily  . B-complex with vitamin C  1 tablet Oral Daily  . cholecalciferol  2,000 Units Oral Daily  . feeding supplement  237 mL Oral TID BM  . finasteride  5 mg Oral Daily  . gemfibrozil  600 mg Oral BID AC  . megestrol  400 mg Oral Daily  . metoprolol succinate  25 mg Oral QHS  . sertraline  50 mg Oral Daily  . DISCONTD: aspirin EC  81 mg Oral Daily  . DISCONTD: cefTRIAXone (ROCEPHIN)  IV  1 g Intravenous Q24H  . DISCONTD: cefTRIAXone (ROCEPHIN)  IV  1 g Intravenous Q24H  . DISCONTD: loratadine  10 mg Oral Daily  . DISCONTD: metoprolol  50 mg Oral BID    Ht: 5\' 9"  (175.3 cm)  Wt: 167 lb (75.751 kg)  Ideal Wt: 72.7 kg % Ideal Wt: 104%  Usual Wt: 84.1 kg % Usual Wt: 90%  Body mass index is 24.66 kg/(m^2).  Food/Nutrition Related Hx: Patient reports that his appetite was poor over the last year PTA, which resulted in a 10% weigh loss over 1 year. This does not meet the criteria for  malnutrition. However, I suspect some degree of malnutrition due to energy intake < 75% of estimated needs and physical appearance. His appetite has improved since admission with 85-100% meal completion. He is also receiving Ensure Complete TID and drinking 100%.   Labs:  CMP     Component Value Date/Time   NA 139 06/14/2012 0435   K 3.9 06/14/2012 0435   CL 108 06/14/2012 0435   CO2 22 06/14/2012 0435   GLUCOSE 89 06/14/2012 0435   BUN 23 06/14/2012 0435   CREATININE 1.00 06/14/2012 0435   CALCIUM 8.9 06/14/2012 0435   PROT 7.9 06/13/2012 1415   ALBUMIN 3.4* 06/13/2012 1415   AST 18 06/13/2012 1415   ALT 10 06/13/2012 1415   ALKPHOS 87 06/13/2012 1415   BILITOT 0.4 06/13/2012 1415   GFRNONAA 67* 06/14/2012 0435   GFRAA 78* 06/14/2012 0435     Intake/Output Summary (Last 24 hours) at 06/14/12 1511 Last data filed at 06/14/12 1132  Gross per 24 hour  Intake   1175 ml  Output  675 ml  Net    500 ml    Diet Order: Heart Healthy  Supplements/Tube Feeding: Ensure Complete TID  IVF:    sodium chloride Last Rate: 75 mL/hr at 06/14/12 1207    Estimated Nutritional Needs:   Kcal: 1750-1900 kcal Protein: 90-105 g Fluid: 2.3 L  NUTRITION DIAGNOSIS: -Unintentional weight loss Status:  Ongoing  RELATED TO: poor appetite  AS EVIDENCE BY: 10% weight loss over 1 year  MONITORING/EVALUATION(Goals): Patient will meet 90-100% of estimated nutrition needs  Monitor: PO intake, labs, weight  EDUCATION NEEDS: -No education needs identified at this time  INTERVENTION: Continue Ensure Complete TID, encourage adequate intake.    DOCUMENTATION CODES Per approved criteria  -Not Applicable    Linnell Fulling [facility] 06/14/2012, 3:04 PM

## 2012-06-14 NOTE — Evaluation (Signed)
Physical Therapy Evaluation Patient Details Name: Brent Day MRN: [id number] DOB: 10/15/1927 Today's Date: 06/14/2012 Time: 1015-1024 PT Time Calculation (min): 9 min  PT Assessment / Plan / Recommendation Clinical Impression  78 yo male admitted with benign essential hypertension, weakness, FTT. Pt demonstrates general weakness and decreased activity tolerance. Recommend HHPT with 24 hour S/A at discharge.     PT Assessment  Patient needs continued PT services    Follow Up Recommendations  Home health PT;Supervision/Assistance - 24 hour    Barriers to Discharge        lEquipment Recommendations  None recommended by PT    Recommendations for Other Services OT consult   Frequency Min 3X/week    Precautions / Restrictions Precautions Precautions: Fall Restrictions Weight Bearing Restrictions: No   Pertinent Vitals/Pain       Mobility  Bed Mobility Bed Mobility: Sit to Supine Sit to Supine: 5: Supervision Transfers Transfers: Sit to Stand;Stand to Sit Sit to Stand: 4: Min assist;With upper extremity assist;From bed Stand to Sit: 4: Min guard;With upper extremity assist;To bed Details for Transfer Assistance: VCs safety, technique, hand placement. Asssit to rise, stabilize. Some difficulty rising from low surface.  Ambulation/Gait Ambulation/Gait Assistance: 4: Min guard Ambulation Distance (Feet): 70 Feet Assistive device: Rolling walker Ambulation/Gait Assistance Details: VCs safety, distance from RW. Fatigues fairly easily. Slow gait speed. Gait pattern abnormality-? leg length discrepancy (prior R hip arthroplasty 2012) Gait Pattern: Step-through pattern    Exercises     PT Diagnosis: Difficulty walking;Generalized weakness  PT Problem List: Decreased strength;Decreased activity tolerance;Decreased mobility;Decreased knowledge of use of DME PT Treatment Interventions: DME instruction;Gait training;Functional mobility training;Therapeutic  activities;Therapeutic exercise;Patient/family education   PT Goals Acute Rehab PT Goals PT Goal Formulation: With patient Time For Goal Achievement: 06/28/12 Potential to Achieve Goals: Good Pt will go Supine/Side to Sit: with modified independence PT Goal: Supine/Side to Sit - Progress: Goal set today Pt will go Sit to Supine/Side: with modified independence PT Goal: Sit to Supine/Side - Progress: Goal set today Pt will go Sit to Stand: with modified independence PT Goal: Sit to Stand - Progress: Goal set today Pt will Ambulate: >150 feet;with supervision;with least restrictive assistive device PT Goal: Ambulate - Progress: Goal set today  Visit Information  Last PT Received On: 06/14/12 Assistance Needed: +1    Subjective Data  Subjective: "I just got so weak" Patient Stated Goal: Home   Prior Functioning  Home Living Lives With: Son;Daughter Available Help at Discharge: Family Type of Home: House Home Access: Stairs to enter Secretary/administrator of Steps: 2 Entrance Stairs-Rails: None Home Layout: One level Home Adaptive Equipment: Environmental consultant - rolling;Straight cane Prior Function Level of Independence: Independent with assistive device(s) Able to Take Stairs?: Yes Communication Communication: No difficulties    Cognition  Overall Cognitive Status: Appears within functional limits for tasks assessed/performed Arousal/Alertness: Awake/alert Orientation Level: Appears intact for tasks assessed Behavior During Session: [facility] for tasks performed    Extremity/Trunk Assessment Right Lower Extremity Assessment RLE ROM/Strength/Tone: Deficits RLE ROM/Strength/Tone Deficits: Strength at least 4/5 with functional activity RLE Coordination: WFL - gross motor Left Lower Extremity Assessment LLE ROM/Strength/Tone: Deficits LLE ROM/Strength/Tone Deficits: Strength at least 4/5 with functional activity LLE Coordination: WFL - gross motor   Balance    End of Session PT - End of  Session Equipment Utilized During Treatment: Gait belt Patient left: in bed;with call bell/phone within reach   Rebeca Alert [facility] 06/14/2012, 10:30 AM [phone]

## 2012-06-14 NOTE — Consult Note (Signed)
Urology Consult  CC: UTI  HPI: 78 year old male admitted for FTT and malaise. He had a urine culture done at his PCPs office on 06/08/12: Enterococcus >100K (sensitive: ampicillin, nitrofurantoin, vancomycin, tetracycline; R=levaquin). Started on amoxicillin, but went to the ER before he could start the medication. He had nausea, bloating, and urinary frequency. This fatigue has been present for the past 2 weeks. Nothing makes it better or worse. Negative gross hematuria or dysuria.  He has a prostate nodule and a PSA around 8 (but since he's on finasteride it is likely closer to 16). He is scheduled for prostate biopsy 07/07/12.    I am concerned he may not be emptying his bladder. We discussed the possible need for a urinary catheter.   PMH: Past Medical History  Diagnosis Date  . Pancreatitis   . Fractured hip 09/2011  . UTI (urinary tract infection)   . Prostate tumor     to have bx by Dr. Margarita Grizzle  . UTI (lower urinary tract infection) 06/13/2012    has had x 3 months  . Falls 06/13/12    hit head approx. 1 month ago  . Unsteady gait 06/13/2012    "bad for awhile but getting worse recently"  . Slurred speech     slurred speech on 06/12/12 - didn't last long, slower talking recently  . Cancer     bladder  . Lung abnormality     spot on ct approx 3 months ago - to be followed - [facility]  . 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 Nose fracture     after a fall - Oct. 2012    PSH: Past Surgical History  Procedure Date  . Cholecystectomy   . Partial hip arthroplasty   . Cystoscopy     has yearly due to bladder cancer  . Retinal detachment surgery     at least 3 and have been in both eyes    Allergies: No Known Allergies  Medications: Prescriptions prior to admission  Medication Sig Dispense Refill  . aspirin 81 MG tablet Take 81 mg by mouth daily.      Marland Kitchen b complex vitamins tablet Take 1 tablet by mouth daily.      . Cholecalciferol (VITAMIN D) 2000  UNITS CAPS Take 1 capsule by mouth daily.      . finasteride (PROSCAR) 5 MG tablet Take 5 mg by mouth daily.      Marland Kitchen gemfibrozil (LOPID) 600 MG tablet Take 600 mg by mouth 2 (two) times daily before a meal.      . metoprolol (LOPRESSOR) 50 MG tablet Take 50 mg by mouth 2 (two) times daily.      . sertraline (ZOLOFT) 100 MG tablet Take 50 mg by mouth daily.      Marland Kitchen sulfamethoxazole-trimethoprim (BACTRIM DS) 800-160 MG per tablet Take 1 tablet by mouth once.         Social History: History   Social History  . Marital Status: Married    Spouse Name: N/A    Number of Children: N/A  . Years of Education: N/A   Occupational History  . Not on file.   Social History Main Topics  . Smoking status: Current Everyday Smoker -- 0.2 packs/day for 62 years  . Smokeless tobacco: Never Used  . Alcohol Use: No  . Drug Use: No  . Sexually Active: Not on file   Other Topics Concern  . Not on file  Social History Narrative  . No narrative on file    Family History: History reviewed. No pertinent family history.  Review of Systems: Positive: Nausea, fatigue, poor PO intake. Negative: Chest pain, SOB, fever.  A further 10 point review of systems was negative except what is listed in the HPI.  Physical Exam:  General: No acute distress.  Awake. Head:  Normocephalic.  Atraumatic. ENT:  EOMI.  Mucous membranes moist Neck:  Supple.  No lymphadenopathy. Pulmonary: Equal effort bilaterally.  Clear to auscultation bilaterally. Abdomen: Soft.  Non- tender to palpation. Skin:  Normal turgor.  No visible rash. Extremity: No gross deformity of bilateral upper extremities.  No gross deformity of    bilateral lower extremities. Neurologic: Alert. Appropriate mood.  Penis:  Circumcised.  No lesions. Urethra: No Foley catheter in place.  Orthotopic meatus. Scrotum: No lesions.  No ecchymosis.  No erythema. Testicles: Descended bilaterally.  No masses bilaterally.   Studies:  Recent Labs    [facility] 06/14/12 0435 06/13/12 1415   HGB 11.0* 12.7*   WBC 8.7 8.4   PLT 288 305    Recent Labs  Basename 06/14/12 0435 06/13/12 1415   NA 139 136   K 3.9 4.3   CL 108 100   CO2 22 23   BUN 23 28*   CREATININE 1.00 1.14   CALCIUM 8.9 9.8   GFRNONAA 67* 58*   GFRAA 78* 67*     No results found for this basename: PT:2,INR:2,APTT:2 in the last 72 hours   No components found with this basename: ABG:2    Assessment:  UTI. Failure to thrive.  Plan: -He looks much worse than he did as an outpatient 3 weeks ago. Given his PSA is below 50 it is unlikely he has metastatic cancer from the prostate. -Will order bladder scan to follow his voids and determine whether or not he will need a catheter. -Will keep current schedule for prostate biopsy. Will schedule him in clinic about a week before to make sure he is healthy enough for biopsy. I discussed the risks, benefits, and goals of prostate biopsy with he and his daughter again today. I do not think he could tolerate treatment for prostate cancer, but a diagnosis could help in decision making in the future. -Will follow.    Pager: [phone]

## 2012-06-14 NOTE — Progress Notes (Signed)
CARE MANAGEMENT NOTE 06/14/2012  Patient:  Brent Day, Brent Day   Account Number:  [id number]  Date Initiated:  06/14/2012  Documentation initiated by:  Jary Louvier  Subjective/Objective Assessment:   pt with poss sepsis and bladder outlet obstruction from enlarged prostate, temp on admit greater than 100.     Action/Plan:   lives at home has adult children in area   Anticipated DC Date:  06/17/2012   Anticipated DC Plan:  HOME W HOME HEALTH SERVICES  In-house referral  NA      DC Planning Services  CM consult      [facility] Choice  HOME HEALTH   Choice offered to / List presented to:  C-1 Patient   DME arranged  NA      DME agency  NA     HH arranged  NA      HH agency  NA   Status of service:  In process, will continue to follow Medicare Important Message given?  YES (If response is "NO", the following Medicare IM given date fields will be blank) Date Medicare IM given:  06/13/2012 Date Additional Medicare IM given:    Discharge Disposition:    Per UR Regulation:  Reviewed for med. necessity/level of care/duration of stay  If discussed at Long Length of Stay Meetings, dates discussed:    Comments:  [id number] Marcelle Smiling, RN, BSN, CCM No discharge needs present at time of this review Case Management [phone]

## 2012-06-15 DIAGNOSIS — R5381 Other malaise: Secondary | ICD-10-CM

## 2012-06-15 DIAGNOSIS — E782 Mixed hyperlipidemia: Secondary | ICD-10-CM

## 2012-06-15 DIAGNOSIS — R5383 Other fatigue: Secondary | ICD-10-CM

## 2012-06-15 DIAGNOSIS — R110 Nausea: Secondary | ICD-10-CM

## 2012-06-15 DIAGNOSIS — E860 Dehydration: Secondary | ICD-10-CM

## 2012-06-15 LAB — BASIC METABOLIC PANEL
CO2: 21 mEq/L (ref 19–32)
Calcium: 8.7 mg/dL (ref 8.4–10.5)
Chloride: 106 mEq/L (ref 96–112)
Glucose, Bld: 105 mg/dL — ABNORMAL HIGH (ref 70–99)
Sodium: 136 mEq/L (ref 135–145)

## 2012-06-15 MED ORDER — ENSURE COMPLETE PO LIQD: 237.0000 mL | Freq: Three times a day (TID) | ORAL | Status: DC

## 2012-06-15 MED ORDER — AMPICILLIN 250 MG PO CAPS: 500.0000 mg | ORAL_CAPSULE | Freq: Four times a day (QID) | ORAL | Status: AC

## 2012-06-15 MED ORDER — SACCHAROMYCES BOULARDII 250 MG PO CAPS: 250.0000 mg | ORAL_CAPSULE | Freq: Two times a day (BID) | ORAL | Status: AC

## 2012-06-15 NOTE — Progress Notes (Signed)
Urology Progress Note  Subjective:     No acute urologic events. The patient had a Foley catheter placed due to urinary retention. At one point he voided 100 cc with a 180cc residual; he then voided 80cc with a 240cc residual. He states he feels better with the catheter in place.   ROS: Negative: SOB  Objective:  Patient Vitals for the past 24 hrs:  BP Temp Temp src Pulse Resp SpO2 Height Weight  06/15/12 0525 117/58 mmHg 98.3 F (36.8 C) Oral 61  18  97 % - 66.996 kg (147 lb 11.2 oz)  06/14/12 2145 108/48 mmHg 98.8 F (37.1 C) Oral 73  18  99 % - -  06/14/12 1412 112/49 mmHg 98 F (36.7 C) Oral 65  18  97 % - -  06/14/12 1300 - - - - - - 5\' 9"  (1.753 m) -    Physical Exam: General:  No acute distress, awake  Chest:  CTA-B Abdomen:               [  ] Soft, appropriately TTP  [ x] Soft, NTTP  [  ] Soft, appropriately TTP, incision(s) clean/dry/intact  Genitourinary:  Foley in place Foley:  Draining clear yellow urine. One clot in the tube.    I/O last 3 completed shifts: In: 2385 [P.O.:600; I.V.:1585; IV Piggyback:200] Out: 2125 [Urine:2125]     Assessment: Urinary retention. UTI.   Plan: -Urine culture has returned positive for Enterococcus; treat according to sensitivities. -Continue foley catheter; he will likely need to be discharged home with the catheter in place. -Follow up with me 06/30/12 at 8:15 am   Natalia Leatherwood, MD [phone]

## 2012-06-15 NOTE — Discharge Summary (Signed)
Discharge Note  Name: Brent Day MRN: [id number] DOB: 05-May-1927 78 y.o.  Date of Admission: 06/13/2012 12:16 PM Date of Discharge: 06/15/2012 Attending Physician: Kela Millin, MD  Discharge Diagnosis: Active Problems:  Essential hypertension, benign  Atrial fibrillation  Generalized weakness  FTT (failure to thrive) in adult  Frequent falls  Dehydration  Nausea Enterococcus UTI prostate mass/urine retention: dc'ed with foley for outpatient follow up with urologist on 7/11.   Pending labs -vit D level - f/u with pcp Discharge Medications: Medication List  As of 06/15/2012 12:42 PM   STOP taking these medications         sulfamethoxazole-trimethoprim 800-160 MG per tablet      Vitamin D 2000 UNITS Caps         TAKE these medications         ampicillin 250 MG capsule   Commonly known as: PRINCIPEN   Take 2 capsules (500 mg total) by mouth 4 (four) times daily.      aspirin 81 MG tablet   Take 81 mg by mouth daily.      b complex vitamins tablet   Take 1 tablet by mouth daily.      feeding supplement Liqd   Take 237 mLs by mouth 3 (three) times daily between meals.      finasteride 5 MG tablet   Commonly known as: PROSCAR   Take 5 mg by mouth daily.      gemfibrozil 600 MG tablet   Commonly known as: LOPID   Take 600 mg by mouth 2 (two) times daily before a meal.      metoprolol 50 MG tablet   Commonly known as: LOPRESSOR   Take 50 mg by mouth 2 (two) times daily.      saccharomyces boulardii 250 MG capsule   Commonly known as: FLORASTOR   Take 1 capsule (250 mg total) by mouth 2 (two) times daily.      sertraline 100 MG tablet   Commonly known as: ZOLOFT   Take 50 mg by mouth daily.            Disposition and follow-up:   Mr.Dock L Mcmanamon was discharged from [facility] in improved/stable condition.    Follow-up Appointments: Discharge Orders    Future Orders Please Complete By Expires   Ambulatory referral to  Home Health      Comments:   Please evaluate Debbe Odea for admission to [facility].  Disciplines requested: Physical Therapy  Services to provide: Strengthening Exercises  Physician to follow patient's care (the person listed here will be responsible for signing ongoing orders): PCP  Requested Start of Care Date: Tomorrow  Special Instructions:   Diet - low sodium heart healthy      Increase activity slowly         Consultations:    Procedures Performed:  Dg Chest 2 View  06/13/2012  *RADIOLOGY REPORT*  Clinical Data: Weakness, decreased appetite, smoker, pancreatitis  CHEST - 2 VIEW  Comparison: 10/11/2011  Findings: Stable heart size and vascularity.  Negative for pneumonia, collapse, consolidation, effusion or pneumothorax. Stable COPD changes/hyperinflation with parenchymal scarring. Degenerative changes of the spine.  IMPRESSION: Stable COPD without acute focal chest process  Original Report Authenticated By: Judie Petit. Ruel Favors, M.D.     Admission HPI Pt is an 78 year old male with a past medical history significant for hypertension, hyperlipidemia, remote history of pancreatitis, prostate mass and recurrent UTIs; came to the hospital complaining of  generalized weakness, nausea, decreased appetite and weight loss. The symptoms has been present for the last couple of weeks and in fact just worsening steadily. Patient has been told by PCP that the most recent urinalysis/culture done in the office demonstrated that he has no clear day urinary tract infection for what he has been using antibiotics. At that moment and because of the symptoms that he had that are worsening patient came to the emergency department for further evaluation and treatment. Patient denies any fever, chills, abdominal pain, diarrhea, vomiting, hematemesis/melena, chest pain, shortness of breath or cough.  In the ED a chest x-ray demonstrated no acute cardiopulmonary process, and just chronic bronchitic  changes; he had a UTI positive for leukocyte esterase but no nitrite and 11-20 wbc's. [facility] hospitalist has been called to examine the patient and provide further evaluation and treatment for his symptoms.  Physical Exam:  General: Alert, awake, oriented x3, in no acute distress.  HEENT: No bruits, no goiter.  Heart: S1 and S2, no murmurs, no rubs or gallops.  Lungs: Clear to auscultation bilaterally.  Abdomen: Soft, nontender, nondistended, positive bowel sounds.  Extremities: No clubbing, cyanosis or edema with positive pedal pulses.  Neuro: Grossly intact, nonfocal   Hospital Course by problem list: 1- Generalized weakness: Progressive problem with associated failure to thrive and decreased appetite. Work up included a UA c/w infection and cultures grew enterococcus UTI sensitive to ampicillin and he is to continue this on discharge. Also vit B12 level was done and came back within normal limits, TSH within normal limits and vitamin D pending at the time of discharge, f/u with pcp.  2-Essential hypertension, benign: stable continue current regimen.  3-Atrial fibrillation: Continue beta blocker, no candidate for anticoagulation. Continue aspirin.  4-severe protein calorie malnutrition and FTT (failure to thrive) in adult:  by mouth intake was encouraged, will continue ensure 3 times a day.  5-Frequent falls: Most likely associated with declining health status and deconditioning. Will follow results of vitamin D level; B12 and TSH within normal limits. physical therapy evaluated pt and recommended home health PT with 24hr assistance/supervision  6-Dehydration: No orthostatics his. Will adjust IV fluids to [facility]. Patient is eating and drinking a little better.  7-Nausea: Better; continue PRN antiemetics.  8-BPH: continue proscar  9-Enterococcus UTI: as above cultures done in the hospital grew enterococcus UTI sensitive to ampicillin and his abx were changed accordingly and he is to continue the  antibiotics  as directed upon  on discharge to complete the treatment course. 10-prostate mass: As mentioned above patient is planning to follow with his urologist in order to have a biopsy done  For further eval and treatment as appropriate.      Discharge Vitals:  BP 117/58  Pulse 61  Temp 98.3 F (36.8 C) (Oral)  Resp 18  Ht 5\' 9"  (1.753 m)  Wt 66.996 kg (147 lb 11.2 oz)  BMI 21.81 kg/m2  SpO2 97%  Discharge Labs:  Results for orders placed during the hospital encounter of 06/13/12 (from the past 24 hour(s))  BASIC METABOLIC PANEL     Status: Abnormal   Collection Time   06/15/12  4:45 AM      Component Value Range   Sodium 136  135 - 145 mEq/L   Potassium 3.8  3.5 - 5.1 mEq/L   Chloride 106  96 - 112 mEq/L   CO2 21  19 - 32 mEq/L   Glucose, Bld 105 (*) 70 - 99 mg/dL  BUN 21  6 - 23 mg/dL   Creatinine, Ser 1.61  0.50 - 1.35 mg/dL   Calcium 8.7  8.4 - 09.6 mg/dL   GFR calc non Af Amer 78 (*) >90 mL/min   GFR calc Af Amer >90  >90 mL/min    Signed: Abdulrahim Siddiqi C 06/15/2012, 12:42 PM

## 2012-06-15 NOTE — Progress Notes (Signed)
Physical Therapy Treatment Patient Details Name: MCGREGOR TINNON MRN: [id number] DOB: [birth year] Today's Date: 06/15/2012 Time: 4098-1191 PT Time Calculation (min): 8 min  PT Assessment / Plan / Recommendation Comments on Treatment Session  Progressing well with mobility.     Follow Up Recommendations  Home health PT;Supervision/Assistance - 24 hour    Barriers to Discharge        Equipment Recommendations  None recommended by PT    Recommendations for Other Services OT consult  Frequency Min 3X/week   Plan Discharge plan remains appropriate    Precautions / Restrictions Precautions Precautions: Fall Restrictions Weight Bearing Restrictions: No   Pertinent Vitals/Pain     Mobility  Bed Mobility Bed Mobility: Supine to Sit;Sit to Supine Supine to Sit: 5: Supervision Sit to Supine: 5: Supervision Transfers Transfers: Sit to Stand;Stand to Sit Sit to Stand: 4: Min guard;From bed;With upper extremity assist Stand to Sit: 4: Min guard;To bed;With upper extremity assist Details for Transfer Assistance: VCs safety, technique, hand placement.  Ambulation/Gait Ambulation/Gait Assistance: 4: Min guard Ambulation Distance (Feet): 225 Feet Assistive device: Rolling walker Ambulation/Gait Assistance Details: Good gait speed. VCs safety.  Gait Pattern: Step-through pattern    Exercises     PT Diagnosis:    PT Problem List:   PT Treatment Interventions:     PT Goals Acute Rehab PT Goals PT Goal: Supine/Side to Sit - Progress: Progressing toward goal PT Goal: Sit to Supine/Side - Progress: Progressing toward goal PT Goal: Sit to Stand - Progress: Progressing toward goal PT Goal: Ambulate - Progress: Progressing toward goal  Visit Information  Last PT Received On: 06/15/12 Assistance Needed: +1    Subjective Data  Subjective: "That was a good walk" Patient Stated Goal: Home   Cognition  Overall Cognitive Status: Appears within functional limits for tasks  assessed/performed Arousal/Alertness: Awake/alert Behavior During Session: [facility] for tasks performed    Balance     End of Session PT - End of Session Equipment Utilized During Treatment: Gait belt Activity Tolerance: Patient tolerated treatment well Patient left: in bed;with call bell/phone within reach   GP     Rebeca Alert [facility] 06/15/2012, 10:44 AM [phone]

## 2012-06-15 NOTE — Progress Notes (Signed)
Called [facility] [place] neither can service home needs at this time, Daughter Eunice Blase gave permission to use [facility], Norberta Keens notified.

## 2012-06-15 NOTE — Progress Notes (Signed)
Discharge instructions given to pt/daughter, verbalized understanding. Left the unit in stable condition. 

## 2012-06-16 LAB — VITAMIN D 1,25 DIHYDROXY: Vitamin D2 1, 25 (OH)2: <8 pg/mL

## 2012-06-28 ENCOUNTER — Emergency Department (HOSPITAL_COMMUNITY): Payer: Medicare Other

## 2012-06-28 ENCOUNTER — Emergency Department (HOSPITAL_COMMUNITY)
Admission: EM | Admit: 2012-06-28 | Discharge: 2012-06-28 | Disposition: A | Discharge status: Home / Self Care | Payer: Medicare Other | Attending: Emergency Medicine | Admitting: Emergency Medicine

## 2012-06-28 ENCOUNTER — Encounter (HOSPITAL_COMMUNITY): Payer: Self-pay | Admitting: *Deleted

## 2012-06-28 DIAGNOSIS — Z79899 Other long term (current) drug therapy: Secondary | ICD-10-CM | POA: Insufficient documentation

## 2012-06-28 DIAGNOSIS — S20219A Contusion of unspecified front wall of thorax, initial encounter: Secondary | ICD-10-CM

## 2012-06-28 DIAGNOSIS — W010XXA Fall on same level from slipping, tripping and stumbling without subsequent striking against object, initial encounter: Secondary | ICD-10-CM | POA: Insufficient documentation

## 2012-06-28 DIAGNOSIS — Z7982 Long term (current) use of aspirin: Secondary | ICD-10-CM | POA: Insufficient documentation

## 2012-06-28 DIAGNOSIS — R319 Hematuria, unspecified: Secondary | ICD-10-CM

## 2012-06-28 DIAGNOSIS — R079 Chest pain, unspecified: Secondary | ICD-10-CM | POA: Insufficient documentation

## 2012-06-28 DIAGNOSIS — Z8551 Personal history of malignant neoplasm of bladder: Secondary | ICD-10-CM | POA: Insufficient documentation

## 2012-06-28 DIAGNOSIS — IMO0002 Reserved for concepts with insufficient information to code with codable children: Secondary | ICD-10-CM | POA: Insufficient documentation

## 2012-06-28 LAB — URINE MICROSCOPIC-ADD ON

## 2012-06-28 LAB — CBC WITH DIFFERENTIAL/PLATELET
Lymphocytes Relative: 19 % (ref 12–46)
Lymphs Abs: 1.9 10*3/uL (ref 0.7–4.0)
MCV: 94.9 fL (ref 78.0–100.0)
Neutrophils Relative %: 70 % (ref 43–77)
Platelets: 385 10*3/uL (ref 150–400)
RBC: 3.92 MIL/uL — ABNORMAL LOW (ref 4.22–5.81)
WBC: 10.2 10*3/uL (ref 4.0–10.5)

## 2012-06-28 LAB — POCT I-STAT, CHEM 8
BUN: 28 mg/dL — ABNORMAL HIGH (ref 6–23)
Chloride: 103 mEq/L (ref 96–112)
Potassium: 4.4 mEq/L (ref 3.5–5.1)
Sodium: 137 mEq/L (ref 135–145)

## 2012-06-28 LAB — URINALYSIS, ROUTINE W REFLEX MICROSCOPIC
Glucose, UA: NEGATIVE mg/dL
pH: 5 (ref 5.0–8.0)

## 2012-06-28 MED ORDER — FENTANYL CITRATE 0.05 MG/ML IJ SOLN
50.0000 ug | Freq: Once | INTRAMUSCULAR | Status: AC
  Administered 2012-06-28: 50 ug via INTRAVENOUS
  Filled 2012-06-28: qty 2

## 2012-06-28 MED ORDER — TRAMADOL HCL 50 MG PO TABS: 50.0000 mg | ORAL_TABLET | Freq: Four times a day (QID) | ORAL | Status: AC | PRN

## 2012-06-28 NOTE — Consult Note (Signed)
Urology Consult  CC: Blood in urine  HPI: 78 year old male with a foley catheter, presents to [facility] ER with gross hematuria. Catheter was placed recently for UTI and retention. He will be presenting in the near future to see Dr. Margarita Grizzle for possible TRUS/Bx.  PMH: Past Medical History  Diagnosis Date  . Pancreatitis   . Fractured hip 09/2011  . UTI (urinary tract infection)   . Prostate tumor     to have bx by Dr. Margarita Grizzle  . UTI (lower urinary tract infection) 06/13/2012    has had x 3 months  . Falls 06/13/12    hit head approx. 1 month ago  . Unsteady gait 06/13/2012    "bad for awhile but getting worse recently"  . Slurred speech     slurred speech on 06/12/12 - didn't last long, slower talking recently  . Cancer     bladder  . Lung abnormality     spot on ct approx 3 months ago - to be followed - [facility]  . 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 Nose fracture     after a fall - Oct. 2012    PSH: Past Surgical History  Procedure Date  . Cholecystectomy   . Partial hip arthroplasty   . Cystoscopy     has yearly due to bladder cancer  . Retinal detachment surgery     at least 3 and have been in both eyes    Allergies: No Known Allergies  Medications:  (Not in a hospital admission)   Social History: History   Social History  . Marital Status: Married    Spouse Name: N/A    Number of Children: N/A  . Years of Education: N/A   Occupational History  . Not on file.   Social History Main Topics  . Smoking status: Current Everyday Smoker -- 0.2 packs/day for 62 years  . Smokeless tobacco: Never Used  . Alcohol Use: No  . Drug Use: No  . Sexually Active: Not on file   Other Topics Concern  . Not on file   Social History Narrative  . No narrative on file    Family History: No family history on file.  Review of Systems: Positive: Gross hematuria, retention. Negative:  .  A further 10 point review of systems was negative except  what is listed in the HPI.  Physical Exam: @VITALS2 @ General: No acute distress.  Awake. Head:  Normocephalic.  Atraumatic. ENT:  EOMI.  Mucous membranes moist Neck:  Supple.    Abdomen: Soft.  Non tender to palpation. Skin:  Normal turgor.  No visible rash. Extremity: No gross deformity of bilateral upper extremities.  No gross deformity of    bilateral lower extremities. Neurologic: Alert. Appropriate mood.  Penis:  No lesions. Urethra:  Foley catheter in place. It is mostly out of penis. Orthotopic meatus. Scrotum: No lesions.  No ecchymosis.  No erythema.  Studies:  Recent Labs  Basename 06/28/12 1833 06/28/12 1815   HGB 12.9* 12.4*   WBC -- 10.2   PLT -- 385    Recent Labs  Basename 06/28/12 1833   NA 137   K 4.4   CL 103   CO2 --   BUN 28*   CREATININE 1.40*   CALCIUM --   GFRNONAA --   GFRAA --     No results found for this basename: PT:2,INR:2,APTT:2 in the last 72 hours   No components  found with this basename: ABG:2  Catheter was removed and replaced properly w/ 18 Fr coude tip catheter. 2-300 cc of clear urine obtained.  Assessment:  Retention w/ catheter displacement and hematuiria  Plan: Keep followup w/ Dr. Margarita Grizzle    [phone]

## 2012-06-28 NOTE — ED Notes (Signed)
Pt states "I didn't wait until the tennis balls on my walker touched and I fell Sunday, my left ribs hurt with breathing, also I got this catheter 7/25, it had a clot this morning, called Dr. Margarita Grizzle and was told to come in and get a new catheter and to ask for the guided catheter and a little pain medicine"

## 2012-06-28 NOTE — ED Provider Notes (Signed)
History     CSN: [id number]  Arrival date & time 06/28/12  1650   First MD Initiated Contact with Patient 06/28/12 1746      Chief Complaint  Patient presents with  . Hematuria  . Fall    left rib pain    (Consider location/radiation/quality/duration/timing/severity/associated sxs/prior treatment) Patient is a 78 y.o. male presenting with hematuria and fall. The history is provided by the patient.  Hematuria This is a recurrent problem. The current episode started today. Pertinent negatives include no abdominal pain, flank pain, nausea or vomiting.  Fall Associated symptoms include hematuria. Pertinent negatives include no numbness, no abdominal pain, no nausea, no vomiting and no headaches.   patient has had some blood in his urine today. He has a Foley catheter in that he got the end of June. He states he has prostate cancer and a bladder mass. The catheter had reportedly stopped flowing. A family member helped clear the blood. Patient states he talked to urology and were told to come in by Dr. Margarita Grizzle and have a catheter put in under endoscopic guidance with pain medicine.  Patient also states that he fell on Sunday. He states his walker was unstable he fell to the left side. He had some pain on his left chest since then. Chest hurt with breathing. No hemoptysis. No fevers. No difficulty breathing. Patient is just on a baby aspirin today. He's also had abrasions to his hands and knees. These have been healing well per patient.  Past Medical History  Diagnosis Date  . Pancreatitis   . Fractured hip 09/2011  . UTI (urinary tract infection)   . Prostate tumor     to have bx by Dr. Margarita Grizzle  . UTI (lower urinary tract infection) 06/13/2012    has had x 3 months  . Falls 06/13/12    hit head approx. 1 month ago  . Unsteady gait 06/13/2012    "bad for awhile but getting worse recently"  . Slurred speech     slurred speech on 06/12/12 - didn't last long, slower talking recently  .  Cancer     bladder  . Lung abnormality     spot on ct approx 3 months ago - to be followed - [facility]  . 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 Nose fracture     after a fall - Oct. 2012    Past Surgical History  Procedure Date  . Cholecystectomy   . Partial hip arthroplasty   . Cystoscopy     has yearly due to bladder cancer  . Retinal detachment surgery     at least 3 and have been in both eyes    No family history on file.  History  Substance Use Topics  . Smoking status: Current Everyday Smoker -- 0.2 packs/day for 62 years  . Smokeless tobacco: Never Used  . Alcohol Use: No      Review of Systems  Constitutional: Negative for activity change and appetite change.  HENT: Negative for neck stiffness.   Eyes: Negative for pain.  Respiratory: Negative for chest tightness and shortness of breath.   Cardiovascular: Positive for chest pain. Negative for leg swelling.  Gastrointestinal: Negative for nausea, vomiting, abdominal pain and diarrhea.  Genitourinary: Positive for hematuria. Negative for flank pain.  Musculoskeletal: Negative for back pain.  Skin: Negative for rash.  Neurological: Negative for weakness, numbness and headaches.  Psychiatric/Behavioral: Negative for behavioral problems.  Allergies  Review of patient's allergies indicates no known allergies.  Home Medications   Current Outpatient Rx  Name Route Sig Dispense Refill  . ASPIRIN 81 MG PO TABS Oral Take 81 mg by mouth daily.    . B COMPLEX PO TABS Oral Take 1 tablet by mouth daily.    Marland Kitchen ALIGN PO CAPS Oral Take 1 capsule by mouth daily.    Marland Kitchen ENSURE COMPLETE PO LIQD Oral Take 237 mLs by mouth 3 (three) times daily between meals.    Marland Kitchen FINASTERIDE 5 MG PO TABS Oral Take 5 mg by mouth daily.    Marland Kitchen GEMFIBROZIL 600 MG PO TABS Oral Take 600 mg by mouth 2 (two) times daily before a meal.    . METOPROLOL TARTRATE 50 MG PO TABS Oral Take 50 mg by mouth 2 (two) times daily.    Marland Kitchen  MIRTAZAPINE 15 MG PO TABS Oral Take 15 mg by mouth at bedtime.    Marland Kitchen SACCHAROMYCES BOULARDII 250 MG PO CAPS Oral Take 250 mg by mouth 3 (three) times daily.    . TRAMADOL HCL 50 MG PO TABS Oral Take 1 tablet (50 mg total) by mouth every 6 (six) hours as needed for pain. 15 tablet 0    BP 122/64  Pulse 93  Temp 97.7 F (36.5 C) (Oral)  Resp 18  Wt 145 lb (65.772 kg)  SpO2 98%  Physical Exam  Nursing note and vitals reviewed. Constitutional: He is oriented to person, place, and time. He appears well-developed and well-nourished.  HENT:  Head: Normocephalic and atraumatic.  Eyes: EOM are normal. Pupils are equal, round, and reactive to light.  Neck: Normal range of motion. Neck supple.  Cardiovascular: Normal rate, regular rhythm and normal heart sounds.   No murmur heard. Pulmonary/Chest: Effort normal and breath sounds normal. He exhibits tenderness.       Point tenderness to left lateral lower chest wall. No crepitance or deformity. No subcutaneous emphysema.  Abdominal: Soft. Bowel sounds are normal. He exhibits no distension and no mass. There is no tenderness. There is no rebound and no guarding.  Genitourinary:       Foley catheter in place. There is bloody urine in the catheter. No suprapubic mass.  Musculoskeletal: Normal range of motion. He exhibits no edema.  Neurological: He is alert and oriented to person, place, and time. No cranial nerve deficit.  Skin: Skin is warm and dry.  Psychiatric: He has a normal mood and affect.    ED Course  Procedures (including critical care time)  Labs Reviewed  CBC WITH DIFFERENTIAL - Abnormal; Notable for the following:    RBC 3.92 (*)     Hemoglobin 12.4 (*)     HCT 37.2 (*)     All other components within normal limits  URINALYSIS, ROUTINE W REFLEX MICROSCOPIC - Abnormal; Notable for the following:    Color, Urine RED (*)  BIOCHEMICALS MAY BE AFFECTED BY COLOR   APPearance CLOUDY (*)     Hgb urine dipstick LARGE (*)      Bilirubin Urine LARGE (*)     Ketones, ur 15 (*)     Protein, ur 100 (*)     Nitrite POSITIVE (*)     Leukocytes, UA LARGE (*)     All other components within normal limits  POCT I-STAT, CHEM 8 - Abnormal; Notable for the following:    BUN 28 (*)     Creatinine, Ser 1.40 (*)     Glucose,  Bld 102 (*)     Hemoglobin 12.9 (*)     HCT 38.0 (*)     All other components within normal limits  URINE MICROSCOPIC-ADD ON - Abnormal; Notable for the following:    Bacteria, UA FEW (*)     All other components within normal limits  URINE CULTURE   Dg Chest 2 View  06/28/2012  *RADIOLOGY REPORT*  Clinical Data: Larey Seat.  Left lateral chest pain.  Smoker with history of hypertension.  CHEST - 2 VIEW  Comparison: Two-view chest x-ray 06/13/2012, 06/18/2011, 02/05/2010.  Findings: Cardiac silhouette upper normal in size, unchanged. Thoracic aorta mildly atherosclerotic, unchanged.  Hilar and mediastinal contours otherwise unremarkable.  Lungs mildly hyperinflated with emphysematous changes in the upper lobes and prominent bronchovascular markings diffusely, unchanged. Apparent nodule in the apparent nodule in the medial right upper lobe is felt to represent prominent costal cartilage calcification in the anterior first rib, as there was no nodule in this location on the 06/13/2012 examination.  No new pulmonary parenchymal abnormalities.  No pleural effusions.  Degenerative changes involving the thoracic spine.  IMPRESSION: Stable COPD.  No acute cardiopulmonary disease.  Original Report Authenticated By: Arnell Sieving, M.D.     1. Hematuria   2. Chest wall contusion       MDM  Patient left chest wall pain. Negative x-ray. Also has hematuria. He is a Foley catheter in. Dr. Lovina Reach stat saw patient and replaced the Foley. He was discharged home. Urine culture was sent.        Juliet Rude. Rubin Payor, MD 06/28/12 [phone]

## 2012-06-29 LAB — URINE CULTURE: Culture: NO GROWTH

## 2012-07-11 ENCOUNTER — Other Ambulatory Visit (HOSPITAL_COMMUNITY): Payer: Self-pay | Admitting: Urology

## 2012-07-11 DIAGNOSIS — C61 Malignant neoplasm of prostate: Secondary | ICD-10-CM

## 2012-07-25 ENCOUNTER — Emergency Department (HOSPITAL_COMMUNITY)
Admission: EM | Admit: 2012-07-25 | Discharge: 2012-07-26 | Disposition: A | Discharge status: Home / Self Care | Payer: Medicare Other | Attending: Emergency Medicine | Admitting: Emergency Medicine

## 2012-07-25 DIAGNOSIS — N390 Urinary tract infection, site not specified: Secondary | ICD-10-CM

## 2012-07-25 DIAGNOSIS — Z8551 Personal history of malignant neoplasm of bladder: Secondary | ICD-10-CM | POA: Insufficient documentation

## 2012-07-25 DIAGNOSIS — Y846 Urinary catheterization as the cause of abnormal reaction of the patient, or of later complication, without mention of misadventure at the time of the procedure: Secondary | ICD-10-CM | POA: Insufficient documentation

## 2012-07-25 DIAGNOSIS — R509 Fever, unspecified: Secondary | ICD-10-CM | POA: Insufficient documentation

## 2012-07-25 DIAGNOSIS — N3289 Other specified disorders of bladder: Secondary | ICD-10-CM | POA: Insufficient documentation

## 2012-07-25 DIAGNOSIS — T8389XA Other specified complication of genitourinary prosthetic devices, implants and grafts, initial encounter: Secondary | ICD-10-CM | POA: Insufficient documentation

## 2012-07-25 DIAGNOSIS — T839XXA Unspecified complication of genitourinary prosthetic device, implant and graft, initial encounter: Secondary | ICD-10-CM

## 2012-07-25 DIAGNOSIS — F17200 Nicotine dependence, unspecified, uncomplicated: Secondary | ICD-10-CM | POA: Insufficient documentation

## 2012-07-25 NOTE — ED Notes (Signed)
Per EMS-called out by family because pt c/o intermittent bladder spasms.  Family states pt unable to sleep, but EMS states that pt was asleep when they arrived and pt did not want to come to the ED.  Family states pt has a hx of prostate cancer-? Placement of pt.  Family wants pt's catheter checked. Family wants pt checked for UTI-family has not notified his primary doctor.

## 2012-07-25 NOTE — ED Notes (Signed)
JJO:[id number]<SA> Expected date:<BR> Expected time:<BR> Means of arrival:<BR> Comments:<BR> 78 yo male with bladder spasms-possible UTI

## 2012-07-26 LAB — URINALYSIS, MICROSCOPIC ONLY
Specific Gravity, Urine: 1.009 (ref 1.005–1.030)
Urobilinogen, UA: 0.2 mg/dL (ref 0.0–1.0)

## 2012-07-26 MED ORDER — CEFTRIAXONE SODIUM 1 G IJ SOLR
1.0000 g | Freq: Once | INTRAMUSCULAR | Status: AC
  Administered 2012-07-26: 1 g via INTRAMUSCULAR
  Filled 2012-07-26: qty 10

## 2012-07-26 MED ORDER — LORAZEPAM 0.5 MG PO TABS
0.5000 mg | ORAL_TABLET | Freq: Once | ORAL | Status: AC
  Administered 2012-07-26: 0.5 mg via ORAL
  Filled 2012-07-26: qty 1

## 2012-07-26 MED ORDER — LIDOCAINE HCL 2 % EX GEL
CUTANEOUS | Status: AC
  Administered 2012-07-26: 01:00:00
  Filled 2012-07-26: qty 10

## 2012-07-26 MED ORDER — LIDOCAINE HCL 1 % IJ SOLN
INTRAMUSCULAR | Status: AC
  Administered 2012-07-26: 04:00:00
  Filled 2012-07-26: qty 20

## 2012-07-26 NOTE — ED Provider Notes (Signed)
History     CSN: [id number]  Arrival date & time 07/25/12  2353   None     Chief Complaint  Patient presents with  . Bladder spasms    (Consider location/radiation/quality/duration/timing/severity/associated sxs/prior treatment) HPI Level 5 Caveat: confusion. This is an 78 year old white male with a chronic indwelling Foley. He has been having bladder spasms for about the last 2 weeks which acutely worsened yesterday evening about 10:30. He was brought in by ambulance due to severe spasms. He received a shot of oxybutynin yesterday with some relief. He was on an antibiotic earlier this month but is not currently on an antibiotic. He has been confused recently and has had a low-grade fever. He has not had any vomiting. He has had difficulty sleeping due to the bladder spasms. His son attributes the bladder spasms due in an improperly inflated catheter balloon.  His nurse replaced his Foley catheter prior to my evaluation with significant relief of the patient's symptoms. She relates that the previous catheter appears to been a Coude with a burst balloon. The patient is now sleeping comfortably.  Past Medical History  Diagnosis Date  . Pancreatitis   . Fractured hip 09/2011  . UTI (urinary tract infection)   . Prostate tumor     to have bx by Dr. Margarita Grizzle  . UTI (lower urinary tract infection) 06/13/2012    has had x 3 months  . Falls 06/13/12    hit head approx. 1 month ago  . Unsteady gait 06/13/2012    "bad for awhile but getting worse recently"  . Slurred speech     slurred speech on 06/12/12 - didn't last long, slower talking recently  . Cancer     bladder  . Lung abnormality     spot on ct approx 3 months ago - to be followed - [facility]  . 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 Nose fracture     after a fall - Oct. 2012    Past Surgical History  Procedure Date  . Cholecystectomy   . Partial hip arthroplasty   . Cystoscopy     has yearly due to  bladder cancer  . Retinal detachment surgery     at least 3 and have been in both eyes    No family history on file.  History  Substance Use Topics  . Smoking status: Current Everyday Smoker -- 0.2 packs/day for 62 years  . Smokeless tobacco: Never Used  . Alcohol Use: No      Review of Systems  Unable to perform ROS   Allergies  Review of patient's allergies indicates no known allergies.  Home Medications   Current Outpatient Rx  Name Route Sig Dispense Refill  . ASPIRIN 81 MG PO TABS Oral Take 81 mg by mouth daily.    . B COMPLEX PO TABS Oral Take 1 tablet by mouth daily.    Marland Kitchen ALIGN PO CAPS Oral Take 1 capsule by mouth daily.    Marland Kitchen ENSURE COMPLETE PO LIQD Oral Take 237 mLs by mouth 3 (three) times daily between meals.    Marland Kitchen FINASTERIDE 5 MG PO TABS Oral Take 5 mg by mouth daily.    Marland Kitchen GEMFIBROZIL 600 MG PO TABS Oral Take 600 mg by mouth 2 (two) times daily before a meal.    . METOPROLOL TARTRATE 50 MG PO TABS Oral Take 50 mg by mouth 2 (two) times daily.    Marland Kitchen MIRTAZAPINE 15  MG PO TABS Oral Take 15 mg by mouth at bedtime.    Marland Kitchen SACCHAROMYCES BOULARDII 250 MG PO CAPS Oral Take 250 mg by mouth 3 (three) times daily.      BP 138/70  Pulse 97  Temp 99.2 F (37.3 C) (Oral)  Resp 14  SpO2 100%  Physical Exam General: Well-developed, well-nourished male in no acute distress; appearance consistent with age of record HENT: normocephalic, atraumatic Eyes: pupils equal round and reactive to light Neck: supple Heart: regular rate and rhythm Lungs: clear to auscultation bilaterally Abdomen: soft; nondistended; nontender; bowel sounds present GU: Tanner 4 male; Foley catheter in place draining cloudy urine with a green tinge Extremities: No deformity; full range of motion Neurologic: sleeping, arousable; motor function intact in all extremities and symmetric; no facial droop Skin: Warm and dry     ED Course  Procedures (including critical care time)     MDM    Nursing notes and vitals signs, including pulse oximetry, reviewed.  Summary of this visit's results, reviewed by myself:  Labs:  Results for orders placed during the hospital encounter of 07/25/12  URINALYSIS, WITH MICROSCOPIC      Component Value Range   Color, Urine GREEN (*) YELLOW   APPearance TURBID (*) CLEAR   Specific Gravity, Urine 1.009  1.005 - 1.030   pH 6.5  5.0 - 8.0   Glucose, UA NEGATIVE  NEGATIVE mg/dL   Hgb urine dipstick MODERATE (*) NEGATIVE   Bilirubin Urine NEGATIVE  NEGATIVE   Ketones, ur NEGATIVE  NEGATIVE mg/dL   Protein, ur 30 (*) NEGATIVE mg/dL   Urobilinogen, UA 0.2  0.0 - 1.0 mg/dL   Nitrite NEGATIVE  NEGATIVE   Leukocytes, UA LARGE (*) NEGATIVE   WBC, UA TOO NUMEROUS TO COUNT  <3 WBC/hpf   Bacteria, UA MANY (*) RARE   Urine-Other FIELD OBSCURED BY WBC'S     We'll give the patient 1 g of Rocephin IM here. He has followup appointment with his primary care physician later today.         Hanley Seamen, MD 07/26/12 [phone]

## 2012-07-26 NOTE — ED Notes (Signed)
Bladder scanner revealed 323 ml of fluid in patient bladder upon arrival. Patient stated that he felt the urge to urinate before existing foley taken out. Patient encouraged to urinate and urinated around existing catheter. Existing catheter taken out and was revealed that the balloon on the patients existing catheter had burst.

## 2012-07-26 NOTE — ED Notes (Signed)
Patient given discharge instructions, information, prescriptions, and diet order. Patient states that they adequately understand discharge information given and to return to ED if symptoms return or worsen.     

## 2012-07-27 ENCOUNTER — Encounter (HOSPITAL_COMMUNITY): Payer: Medicare Other

## 2012-07-27 ENCOUNTER — Encounter (HOSPITAL_COMMUNITY): Payer: Self-pay | Admitting: Emergency Medicine

## 2012-07-27 ENCOUNTER — Inpatient Hospital Stay (HOSPITAL_COMMUNITY)
Admission: EM | Admit: 2012-07-27 | Discharge: 2012-07-30 | DRG: 871 | Disposition: A | Discharge status: Home / Self Care | Payer: Medicare Other | Attending: Family Medicine | Admitting: Family Medicine

## 2012-07-27 ENCOUNTER — Ambulatory Visit (HOSPITAL_COMMUNITY): Payer: Medicare Other

## 2012-07-27 ENCOUNTER — Emergency Department (HOSPITAL_COMMUNITY): Payer: Medicare Other

## 2012-07-27 DIAGNOSIS — Z66 Do not resuscitate: Secondary | ICD-10-CM | POA: Diagnosis present

## 2012-07-27 DIAGNOSIS — Z4789 Encounter for other orthopedic aftercare: Secondary | ICD-10-CM

## 2012-07-27 DIAGNOSIS — R531 Weakness: Secondary | ICD-10-CM

## 2012-07-27 DIAGNOSIS — Z79899 Other long term (current) drug therapy: Secondary | ICD-10-CM

## 2012-07-27 DIAGNOSIS — R509 Fever, unspecified: Secondary | ICD-10-CM

## 2012-07-27 DIAGNOSIS — Z9181 History of falling: Secondary | ICD-10-CM

## 2012-07-27 DIAGNOSIS — C679 Malignant neoplasm of bladder, unspecified: Secondary | ICD-10-CM | POA: Diagnosis present

## 2012-07-27 DIAGNOSIS — E860 Dehydration: Secondary | ICD-10-CM

## 2012-07-27 DIAGNOSIS — I129 Hypertensive chronic kidney disease with stage 1 through stage 4 chronic kidney disease, or unspecified chronic kidney disease: Secondary | ICD-10-CM | POA: Diagnosis present

## 2012-07-27 DIAGNOSIS — I10 Essential (primary) hypertension: Secondary | ICD-10-CM

## 2012-07-27 DIAGNOSIS — N390 Urinary tract infection, site not specified: Secondary | ICD-10-CM

## 2012-07-27 DIAGNOSIS — R110 Nausea: Secondary | ICD-10-CM

## 2012-07-27 DIAGNOSIS — C61 Malignant neoplasm of prostate: Secondary | ICD-10-CM | POA: Diagnosis present

## 2012-07-27 DIAGNOSIS — R627 Adult failure to thrive: Secondary | ICD-10-CM

## 2012-07-27 DIAGNOSIS — A419 Sepsis, unspecified organism: Principal | ICD-10-CM | POA: Diagnosis present

## 2012-07-27 DIAGNOSIS — R296 Repeated falls: Secondary | ICD-10-CM

## 2012-07-27 DIAGNOSIS — F17200 Nicotine dependence, unspecified, uncomplicated: Secondary | ICD-10-CM | POA: Diagnosis present

## 2012-07-27 DIAGNOSIS — I495 Sick sinus syndrome: Secondary | ICD-10-CM

## 2012-07-27 DIAGNOSIS — I4891 Unspecified atrial fibrillation: Secondary | ICD-10-CM

## 2012-07-27 DIAGNOSIS — N181 Chronic kidney disease, stage 1: Secondary | ICD-10-CM | POA: Diagnosis present

## 2012-07-27 DIAGNOSIS — C801 Malignant (primary) neoplasm, unspecified: Secondary | ICD-10-CM

## 2012-07-27 DIAGNOSIS — K861 Other chronic pancreatitis: Secondary | ICD-10-CM | POA: Diagnosis present

## 2012-07-27 DIAGNOSIS — G9341 Metabolic encephalopathy: Secondary | ICD-10-CM | POA: Diagnosis present

## 2012-07-27 DIAGNOSIS — N12 Tubulo-interstitial nephritis, not specified as acute or chronic: Secondary | ICD-10-CM | POA: Diagnosis present

## 2012-07-27 HISTORY — DX: Essential (primary) hypertension: I10

## 2012-07-27 LAB — CBC WITH DIFFERENTIAL/PLATELET
Hemoglobin: 12.7 g/dL — ABNORMAL LOW (ref 13.0–17.0)
Lymphocytes Relative: 9 % — ABNORMAL LOW (ref 12–46)
Lymphs Abs: 0.7 10*3/uL (ref 0.7–4.0)
MCH: 30.5 pg (ref 26.0–34.0)
Monocytes Relative: 4 % (ref 3–12)
Neutro Abs: 6.7 10*3/uL (ref 1.7–7.7)
Neutrophils Relative %: 86 % — ABNORMAL HIGH (ref 43–77)
RBC: 4.17 MIL/uL — ABNORMAL LOW (ref 4.22–5.81)
WBC: 7.9 10*3/uL (ref 4.0–10.5)

## 2012-07-27 LAB — COMPREHENSIVE METABOLIC PANEL
ALT: 23 U/L (ref 0–53)
Alkaline Phosphatase: 81 U/L (ref 39–117)
BUN: 26 mg/dL — ABNORMAL HIGH (ref 6–23)
CO2: 23 mEq/L (ref 19–32)
Chloride: 97 mEq/L (ref 96–112)
GFR calc Af Amer: 57 mL/min — ABNORMAL LOW (ref >90)
Glucose, Bld: 127 mg/dL — ABNORMAL HIGH (ref 70–99)
Potassium: 4.2 mEq/L (ref 3.5–5.1)
Total Bilirubin: 0.4 mg/dL (ref 0.3–1.2)

## 2012-07-27 LAB — URINALYSIS, ROUTINE W REFLEX MICROSCOPIC
Bilirubin Urine: NEGATIVE
Ketones, ur: NEGATIVE mg/dL
Nitrite: NEGATIVE
Protein, ur: 30 mg/dL — AB

## 2012-07-27 LAB — URINE MICROSCOPIC-ADD ON

## 2012-07-27 MED ORDER — HEPARIN SODIUM (PORCINE) 5000 UNIT/ML IJ SOLN
5000.0000 [IU] | Freq: Three times a day (TID) | INTRAMUSCULAR | Status: DC
  Administered 2012-07-27 – 2012-07-30 (×8): 5000 [IU] via SUBCUTANEOUS
  Filled 2012-07-27 (×11): qty 1

## 2012-07-27 MED ORDER — FINASTERIDE 5 MG PO TABS
5.0000 mg | ORAL_TABLET | Freq: Every day | ORAL | Status: DC
  Administered 2012-07-27 – 2012-07-30 (×4): 5 mg via ORAL
  Filled 2012-07-27 (×4): qty 1

## 2012-07-27 MED ORDER — DOCUSATE SODIUM 100 MG PO CAPS
100.0000 mg | ORAL_CAPSULE | Freq: Two times a day (BID) | ORAL | Status: DC
  Administered 2012-07-27 – 2012-07-30 (×6): 100 mg via ORAL
  Filled 2012-07-27 (×7): qty 1

## 2012-07-27 MED ORDER — MIRTAZAPINE 15 MG PO TABS
15.0000 mg | ORAL_TABLET | Freq: Every day | ORAL | Status: DC
  Administered 2012-07-27 – 2012-07-29 (×3): 15 mg via ORAL
  Filled 2012-07-27 (×4): qty 1

## 2012-07-27 MED ORDER — SODIUM CHLORIDE 0.9 % IV BOLUS (SEPSIS)
1000.0000 mL | Freq: Once | INTRAVENOUS | Status: AC
  Administered 2012-07-27: 1000 mL via INTRAVENOUS

## 2012-07-27 MED ORDER — ZOLPIDEM TARTRATE 5 MG PO TABS
5.0000 mg | ORAL_TABLET | Freq: Every evening | ORAL | Status: DC | PRN
  Administered 2012-07-27 – 2012-07-29 (×3): 5 mg via ORAL
  Filled 2012-07-27 (×3): qty 1

## 2012-07-27 MED ORDER — HYDROCODONE-ACETAMINOPHEN 5-325 MG PO TABS: 1.0000 | ORAL_TABLET | ORAL | Status: DC | PRN

## 2012-07-27 MED ORDER — SACCHAROMYCES BOULARDII 250 MG PO CAPS
250.0000 mg | ORAL_CAPSULE | Freq: Three times a day (TID) | ORAL | Status: DC
  Administered 2012-07-27 – 2012-07-30 (×8): 250 mg via ORAL
  Filled 2012-07-27 (×10): qty 1

## 2012-07-27 MED ORDER — ASPIRIN EC 81 MG PO TBEC
81.0000 mg | DELAYED_RELEASE_TABLET | Freq: Every day | ORAL | Status: DC
  Administered 2012-07-27 – 2012-07-30 (×4): 81 mg via ORAL
  Filled 2012-07-27 (×4): qty 1

## 2012-07-27 MED ORDER — DEXTROSE 5 % IV SOLN: 1.0000 g | Freq: Two times a day (BID) | INTRAVENOUS | Status: DC

## 2012-07-27 MED ORDER — ACETAMINOPHEN 325 MG PO TABS: 650.0000 mg | ORAL_TABLET | Freq: Four times a day (QID) | ORAL | Status: DC | PRN

## 2012-07-27 MED ORDER — METOPROLOL TARTRATE 25 MG PO TABS
25.0000 mg | ORAL_TABLET | Freq: Every day | ORAL | Status: DC
  Administered 2012-07-28: 25 mg via ORAL
  Filled 2012-07-27 (×2): qty 1

## 2012-07-27 MED ORDER — ACETAMINOPHEN 650 MG RE SUPP: 650.0000 mg | Freq: Four times a day (QID) | RECTAL | Status: DC | PRN

## 2012-07-27 MED ORDER — DEXTROSE 5 % IV SOLN
1.0000 g | Freq: Two times a day (BID) | INTRAVENOUS | Status: DC
  Administered 2012-07-27 – 2012-07-28 (×2): 1 g via INTRAVENOUS
  Filled 2012-07-27 (×3): qty 1

## 2012-07-27 MED ORDER — ACETAMINOPHEN 325 MG PO TABS
650.0000 mg | ORAL_TABLET | Freq: Once | ORAL | Status: AC
  Administered 2012-07-27: 650 mg via ORAL
  Filled 2012-07-27: qty 2

## 2012-07-27 NOTE — ED Notes (Signed)
Patient has foley catheter.  Urine is capped off. RN aware of need for needle and syringe to obtain urine from catheter.

## 2012-07-27 NOTE — Progress Notes (Signed)
[name], is a 78 y.o. male,   MRN: [id number]  -  DOB - [birth year]  Outpatient Primary MD for the patient is [name], MD  in for    Chief Complaint  Patient presents with  . Fever     Blood pressure 111/68, pulse 85, temperature 104.2 F (40.1 C), temperature source Rectal, resp. rate 18, SpO2 94.00%.  Active Problems:  Atrial fibrillation  Generalized weakness  Dehydration  UTI (lower urinary tract infection)  78 yo hx UTI with chronic foley at home presents to ED cc fever/weakness. Pt presented to ED yesterday with bladder spasms. Foley was changed and cefdinir . According to daughter, patient had fever last night with some confusion and weakness. No reports of nausea/vomiting/diarrhea. No CP, palpitations, cough, headache. Daughter called urology today and they recommended pt be seen in ED for admission.    In ED WC 7.9, sodium 133, BUN 26 creat 1.29, temp 104.2 rectally, SBP range 112-130, HR 85-98. sats 94% on room air Given IV fluids in ED with IV cefepime as culture from 07/26/12 showing pseudomonas.  On exam alert, oriented to self and place, complaining of hunger and requesting food. Hemodynamically stable.

## 2012-07-27 NOTE — ED Provider Notes (Signed)
History     CSN: [id number]  Arrival date & time 07/27/12  1507   First MD Initiated Contact with Patient 07/27/12 [phone]      Chief Complaint  Patient presents with  . Fever    (Consider location/radiation/quality/duration/timing/severity/associated sxs/prior treatment) HPI Comments: 78 y/o male presents with his daughter with sudden onset fever last night of 103. No alleviating factors. Daughter states he was confused last night, but is not at this time. He was catheterized yesterday at the hospital due to his chronic UTI's and prostatic disease. Also began cefdinir for UTI last night. Denies any dysuria or abdominal pain at this time. Those symptoms were present prior to catheterization yesterday. Denies chest pain, sob, or confusion. Denies and bowel changes and had a normal bowel movement this morning. Daughter states his appetite has not been as good as it normally is lately. Daughter called his urologist Dr. Margarita Grizzle today who advised her to take patient in to be admitted.  Patient is a 78 y.o. male presenting with fever. The history is provided by the patient and a relative.  Fever Primary symptoms of the febrile illness include fever. Primary symptoms do not include shortness of breath, abdominal pain, nausea or dysuria.    Past Medical History  Diagnosis Date  . Pancreatitis   . Fractured hip 09/2011  . UTI (urinary tract infection)   . Prostate tumor     to have bx by Dr. Margarita Grizzle  . UTI (lower urinary tract infection) 06/13/2012    has had x 3 months  . Falls 06/13/12    hit head approx. 1 month ago  . Unsteady gait 06/13/2012    "bad for awhile but getting worse recently"  . Slurred speech     slurred speech on 06/12/12 - didn't last long, slower talking recently  . Cancer     bladder  . Lung abnormality     spot on ct approx 3 months ago - to be followed - [facility]  . 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 Nose fracture     after a fall - Oct. 2012     Past Surgical History  Procedure Date  . Cholecystectomy   . Partial hip arthroplasty   . Cystoscopy     has yearly due to bladder cancer  . Retinal detachment surgery     at least 3 and have been in both eyes    No family history on file.  History  Substance Use Topics  . Smoking status: Current Everyday Smoker -- 0.2 packs/day for 62 years  . Smokeless tobacco: Never Used  . Alcohol Use: No      Review of Systems  Constitutional: Positive for fever and appetite change. Negative for chills.  Respiratory: Negative for shortness of breath.   Cardiovascular: Negative for chest pain.  Gastrointestinal: Negative for nausea and abdominal pain.  Genitourinary: Negative for dysuria, urgency and flank pain.  Psychiatric/Behavioral: Confusion: subsided last night.    Allergies  Review of patient's allergies indicates no known allergies.  Home Medications   Current Outpatient Rx  Name Route Sig Dispense Refill  . ACETAMINOPHEN 500 MG PO TABS Oral Take 500-1,000 mg by mouth every 6 (six) hours as needed. For pain.    . B COMPLEX PO TABS Oral Take 1 tablet by mouth daily.    Marland Kitchen CEFDINIR 300 MG PO CAPS Oral Take 300 mg by mouth 2 (two) times daily. 10 day course of  therapy; started 07/26/12    . ENSURE PLUS PO LIQD Oral Take 237 mLs by mouth 2 (two) times daily between meals.    . FESOTERODINE FUMARATE ER 4 MG PO TB24 Oral Take 4 mg by mouth daily.    Marland Kitchen FINASTERIDE 5 MG PO TABS Oral Take 5 mg by mouth daily.    Marland Kitchen GEMFIBROZIL 600 MG PO TABS Oral Take 600 mg by mouth 2 (two) times daily before a meal.    . METOPROLOL TARTRATE 50 MG PO TABS Oral Take 25 mg by mouth daily.     Marland Kitchen MIRTAZAPINE 15 MG PO TABS Oral Take 15 mg by mouth at bedtime.    Marland Kitchen SACCHAROMYCES BOULARDII 250 MG PO CAPS Oral Take 250 mg by mouth 3 (three) times daily.      BP 111/68  Pulse 85  Temp 104.2 F (40.1 C) (Rectal)  Resp 18  SpO2 94%  Physical Exam  Nursing note and vitals reviewed. Constitutional:  He appears well-developed and well-nourished. No distress.  HENT:  Head: Normocephalic and atraumatic.  Mouth/Throat: Oropharynx is clear and moist.  Eyes: Conjunctivae are normal.  Neck: Neck supple.  Cardiovascular: Normal rate, regular rhythm and normal heart sounds.   Pulmonary/Chest: Effort normal and breath sounds normal.  Abdominal: Soft. Bowel sounds are normal. He exhibits no distension. There is no tenderness.  Genitourinary:       Foley catheter in place. Urine appears clear yellow.    ED Course  Procedures (including critical care time)   Labs Reviewed  CBC WITH DIFFERENTIAL  COMPREHENSIVE METABOLIC PANEL  URINALYSIS, ROUTINE W REFLEX MICROSCOPIC  URINE CULTURE   Results for orders placed during the hospital encounter of 07/27/12  CBC WITH DIFFERENTIAL      Component Value Range   WBC 7.9  4.0 - 10.5 K/uL   RBC 4.17 (*) 4.22 - 5.81 MIL/uL   Hemoglobin 12.7 (*) 13.0 - 17.0 g/dL   HCT 16.1  09.6 - 04.5 %   MCV 93.8  78.0 - 100.0 fL   MCH 30.5  26.0 - 34.0 pg   MCHC 32.5  30.0 - 36.0 g/dL   RDW 40.9  81.1 - 91.4 %   Platelets 304  150 - 400 K/uL   Neutrophils Relative 86 (*) 43 - 77 %   Neutro Abs 6.7  1.7 - 7.7 K/uL   Lymphocytes Relative 9 (*) 12 - 46 %   Lymphs Abs 0.7  0.7 - 4.0 K/uL   Monocytes Relative 4  3 - 12 %   Monocytes Absolute 0.3  0.1 - 1.0 K/uL   Eosinophils Relative 1  0 - 5 %   Eosinophils Absolute 0.1  0.0 - 0.7 K/uL   Basophils Relative 0  0 - 1 %   Basophils Absolute 0.0  0.0 - 0.1 K/uL    Dg Chest 1 View (portable)  07/27/2012  *RADIOLOGY REPORT*  Clinical Data: Fever and weakness.  Prostate cancer.  CHEST - 1 VIEW  Comparison: Two-view chest 06/28/2012.  Findings: Emphysematous changes are again seen.  The heart size is within normal limits.  No focal airspace disease is evident.  The visualized soft tissues and bony thorax are unremarkable.  IMPRESSION:  1.  No acute cardiopulmonary disease or significant interval change. 2.  Emphysema.   Original Report Authenticated By: Jamesetta Orleans. MATTERN, M.D.     1. Fever   2. Urinary tract infection       MDM  78 y/o male with fever  s/p urinary catheterization yesterday. Was started on abx yesterday for UTI. Culture showing pseudomonas. Will begin IV cefepime. Due to chronic catheterization, UTI, yesterdays confusion, and high fevers, will admit. Patient also evaluated by Dr. Rhunette Croft who agrees with plan of care.        Trevor Mace, PA-C 07/27/12 1720

## 2012-07-27 NOTE — ED Notes (Signed)
Pt presenting to ed with c/o fever x 2 days pt's family states that pt has history of uti. Pt denies nausea and vomiting at this time. Pt denies pain at this time. Pt denies any signs of dysuria. Pt's family states he had foley placed here last night for dysuria.

## 2012-07-27 NOTE — H&P (Signed)
[facility] History and Physical  Huston Foley. VWU:[id number] DOB: 22-Feb-1927 DOA: 07/27/2012  Referring physician: Rhunette Croft PCP: [name], MD   Chief Complaint: Fever  HPI:  78 yr old male presented today to the ED after being seen 07/25/12 in the Ed.  He has chronic UTI issues and retention of urine which has been chronic-He has been Rx by Dr. Margarita Grizzle for this.  He had a catheter which was "way too large" and has had signicant spasms-because the spasms were severe, the Catheter was removed and he felt better-he has had what seems to daughter to be SE from [place] and she thought he might have UTI's-he hasn't also really been sleeping because of the pain He started to have a fever last night and was called in Cefidin 300 mg which he took last pm last night and this am (1/10). Daughter relates has had signifcant weigth loss and he does seem to have an aggressive type of Prostate cancer and was supposed to have a Bone scan this am but was too sick to get this done today (now re-scheduled till 8/12).  Noted that his urine culture taken 07/25/2012 is going above 100,000 colony forming units of pseudomonas -sensitivity reports which are pending  Review of Systems:  No ha/cp, +n +v about 3 days ago (felt like gerd), no cough or sputum, no blurred or double vision, no dark stool or tarry stool  Had a prostate biopsy which caused some bleeding a few weeks back.  No other significant complaints  Past Medical History  Diagnosis Date  . Pancreatitis   . Fractured hip 09/2011  . UTI (urinary tract infection)   . Prostate tumor     to have bx by Dr. Margarita Grizzle  . UTI (lower urinary tract infection) 06/13/2012    has had x 3 months  . Falls 06/13/12    hit head approx. 1 month ago  . Unsteady gait 06/13/2012    "bad for awhile but getting worse recently"  . Slurred speech     slurred speech on 06/12/12 - didn't last long, slower talking recently  . Cancer     bladder  .  Lung abnormality     spot on ct approx 3 months ago - to be followed - [facility]  . 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 Nose fracture     after a fall - Oct. 2012   Paroxysmal A fibrillation last seen by Dr. Sharrell Ku 2009-, not on Coumadin right now History GI bleed 04/26/2003-diverticulosis-gastroenterologist Dr. Randa Evens History bladder cancer 1998 Rx cystoscopy without recurrent Right eye retinal detachment status post posterior vitrectomy 10/26/2007 Acute on chronic pancreatitis with pseudocyst and pancreatic duct obstruction status post cholecystectomy 03/06/2011 Right hip femoral neck fracture repaired 10/11/2011 by Dr. Allie Bossier Admission 06/13/2012 for enterococcal UTI 18 French coud tip catheter replaced by urology 06/28/2012 tender to gross hematuria  Past Surgical History  Procedure Date  . Cholecystectomy   . Partial hip arthroplasty   . Cystoscopy     has yearly due to bladder cancer  . Retinal detachment surgery     at least 3 and have been in both eyes   Social History:  reports that he has been smoking.  He has never used smokeless tobacco. He reports that he does not drink alcohol or use illicit drugs. Patient lives at home with his daughter for the past month and can do some ADLs but needs  assistance   No Known Allergies  Family History  Problem Relation Age of Onset  . Family history unknown: Yes    Prior to Admission medications   Medication Sig Start Date End Date Taking? Authorizing Provider  acetaminophen (TYLENOL) 500 MG tablet Take 500-1,000 mg by mouth every 6 (six) hours as needed. For pain.   Yes Historical Provider, MD  b complex vitamins tablet Take 1 tablet by mouth daily.   Yes Historical Provider, MD  cefdinir (OMNICEF) 300 MG capsule Take 300 mg by mouth 2 (two) times daily. 10 day course of therapy; started 07/26/12   Yes Historical Provider, MD  Ensure Plus (ENSURE PLUS) LIQD Take 237 mLs by mouth 2 (two) times daily  between meals.   Yes Historical Provider, MD  fesoterodine (TOVIAZ) 4 MG TB24 Take 4 mg by mouth daily.   Yes Historical Provider, MD  finasteride (PROSCAR) 5 MG tablet Take 5 mg by mouth daily.   Yes Historical Provider, MD  gemfibrozil (LOPID) 600 MG tablet Take 600 mg by mouth 2 (two) times daily before a meal.   Yes Historical Provider, MD  metoprolol (LOPRESSOR) 50 MG tablet Take 25 mg by mouth daily.    Yes Historical Provider, MD  mirtazapine (REMERON) 15 MG tablet Take 15 mg by mouth at bedtime.   Yes Historical Provider, MD  saccharomyces boulardii (FLORASTOR) 250 MG capsule Take 250 mg by mouth 3 (three) times daily.   Yes Historical Provider, MD   Physical Exam: Filed Vitals:   07/27/12 1530 07/27/12 1602  BP: 111/68   Pulse: 85   Temp: 99.8 F (37.7 C) 104.2 F (40.1 C)  TempSrc: Oral Rectal  Resp: 18   SpO2: 94%      General:  Alert pleasant Caucasian male in no apparent distress  Eyes: Scleral icterus or pallor  ENT: Soft supple no bruit no JVD, poor dentition and has dentures throat clear  Cardiovascular: S1-S2 no murmur rub or gallop regular rate and rhythm  Respiratory: Clinically clear no added sound  Abdomen: Soft nontender nondistended, full catheter in place  Skin: Onychogryphosis to both feet patient has a not at the back of his leg/calf K. switch appears to be indurated skin  Musculoskeletal: Good range of motion bilaterally to both feet and knees and legs  Psychiatric: Euthymic  Neurologic: Motor grossly intact, sensory intact, reflexes 5 out of 5, vision by direct confrontation is normal  Labs on Admission:  Basic Metabolic Panel:  Lab 07/27/12 1610  NA 133*  K 4.2  CL 97  CO2 23  GLUCOSE 127*  BUN 26*  CREATININE 1.29  CALCIUM 8.9  MG --  PHOS --   Liver Function Tests:  Lab 07/27/12 1537  AST 67*  ALT 23  ALKPHOS 81  BILITOT 0.4  PROT 7.3  ALBUMIN 2.7*   No results found for this basename: LIPASE:5,AMYLASE:5 in the last 168  hours No results found for this basename: AMMONIA:5 in the last 168 hours CBC:  Lab 07/27/12 1537  WBC 7.9  NEUTROABS 6.7  HGB 12.7*  HCT 39.1  MCV 93.8  PLT 304   Cardiac Enzymes: No results found for this basename: CKTOTAL:5,CKMB:5,CKMBINDEX:5,TROPONINI:5 in the last 168 hours  BNP (last 3 results) No results found for this basename: PROBNP:3 in the last 8760 hours CBG: No results found for this basename: GLUCAP:5 in the last 168 hours  Radiological Exams on Admission: Dg Chest 1 View (portable)  07/27/2012  *RADIOLOGY REPORT*  Clinical Data: Fever  and weakness.  Prostate cancer.  CHEST - 1 VIEW  Comparison: Two-view chest 06/28/2012.  Findings: Emphysematous changes are again seen.  The heart size is within normal limits.  No focal airspace disease is evident.  The visualized soft tissues and bony thorax are unremarkable.  IMPRESSION:  1.  No acute cardiopulmonary disease or significant interval change. 2.  Emphysema.  Original Report Authenticated By: Jamesetta Orleans. MATTERN, M.D.    EKG: Independently reviewed.  None done  Assessment/Plan Active Problems:  Essential hypertension, benign  Atrial fibrillation  SINUS BRADYCARDIA  Generalized weakness  FTT (failure to thrive) in adult  Frequent falls  Dehydration  Nausea  UTI (lower urinary tract infection)  Cancer   1. Sepsis secondary to Pyelonephritis-patient has been treated with 2 doses of oral Ceftin dinner-patient also has metastatic prostate cancer likely causing incontinence vs. obstruction.  Continue IV fluids 100 cc per hour given soft blood pressure  Patient will need to continue his scheduled medications however his daughter states that his home regimen of medications as caused some confusion.  I do not know if this is metabolic encephalopathy vs. medication effect.  We will hold medications for the time being and consult his primary urologist Dr. Margarita Grizzle in the morning to see the patient-given this is the second  UTI with a different organism this time Pseudomonas, low threshold to involve infectious disease in his care 2. Metastatic prostate cancer-patient schedule for truss later on this month.  Further disposition regarding this per urology 3. Paroxysmal atrial fibrillation, [place] score about 2-stable at current time, not a Coumadin candidate, decreased dose metoprolol to 12.5 twice a day given low blood pressure blood pressure-start aspirin 81 mg daily 4. History chronic pancreatitis-stable 5. History of bladder cancer-per urologist  Code Status: DO NOT RESUSCITATE Family Communication: Discussed in great detail plan and course of care with the daughter at bedside Disposition Plan: Inpatient, admit to hospitalist service.  Urologist to be consulted tomorrow a.m.  Time spent: 1 hour  Rhetta Mura [facility] Pager [phone]  If 7PM-7AM, please contact night-coverage [email or web address] Password [email or web address] 07/27/2012, 6:01 PM

## 2012-07-27 NOTE — ED Notes (Signed)
Called to give report, nurse available.

## 2012-07-28 ENCOUNTER — Inpatient Hospital Stay (HOSPITAL_COMMUNITY): Payer: Medicare Other

## 2012-07-28 ENCOUNTER — Encounter (HOSPITAL_COMMUNITY): Payer: Self-pay | Admitting: Radiology

## 2012-07-28 DIAGNOSIS — C801 Malignant (primary) neoplasm, unspecified: Secondary | ICD-10-CM

## 2012-07-28 LAB — URINE CULTURE: Colony Count: NO GROWTH

## 2012-07-28 LAB — CBC
Hemoglobin: 10.5 g/dL — ABNORMAL LOW (ref 13.0–17.0)
MCHC: 32.4 g/dL (ref 30.0–36.0)
Platelets: 275 10*3/uL (ref 150–400)

## 2012-07-28 LAB — COMPREHENSIVE METABOLIC PANEL
ALT: 20 U/L (ref 0–53)
CO2: 24 mEq/L (ref 19–32)
Calcium: 8.3 mg/dL — ABNORMAL LOW (ref 8.4–10.5)
Chloride: 104 mEq/L (ref 96–112)
Creatinine, Ser: 1.15 mg/dL (ref 0.50–1.35)
GFR calc Af Amer: 66 mL/min — ABNORMAL LOW (ref >90)
GFR calc non Af Amer: 57 mL/min — ABNORMAL LOW (ref >90)
Glucose, Bld: 99 mg/dL (ref 70–99)
Total Bilirubin: 0.2 mg/dL — ABNORMAL LOW (ref 0.3–1.2)

## 2012-07-28 LAB — GLUCOSE, CAPILLARY: Glucose-Capillary: 135 mg/dL — ABNORMAL HIGH (ref 70–99)

## 2012-07-28 LAB — TESTOSTERONE: Testosterone: 247.2 ng/dL — ABNORMAL LOW (ref 300–890)

## 2012-07-28 MED ORDER — TECHNETIUM TC 99M MEDRONATE IV KIT
24.0000 | PACK | Freq: Once | INTRAVENOUS | Status: AC | PRN
  Administered 2012-07-28: 24 via INTRAVENOUS

## 2012-07-28 MED ORDER — CEFDINIR 125 MG/5ML PO SUSR
300.0000 mg | Freq: Two times a day (BID) | ORAL | Status: DC
  Administered 2012-07-28 – 2012-07-30 (×4): 300 mg via ORAL
  Filled 2012-07-28 (×5): qty 12

## 2012-07-28 MED ORDER — ENSURE COMPLETE PO LIQD
237.0000 mL | Freq: Two times a day (BID) | ORAL | Status: DC
  Administered 2012-07-28 – 2012-07-30 (×4): 237 mL via ORAL

## 2012-07-28 MED ORDER — BOOST / RESOURCE BREEZE PO LIQD
1.0000 | Freq: Every day | ORAL | Status: DC
  Administered 2012-07-28 – 2012-07-29 (×2): 1 via ORAL

## 2012-07-28 MED ORDER — CEFDINIR 300 MG PO CAPS: 300.0000 mg | ORAL_CAPSULE | Freq: Two times a day (BID) | ORAL | Status: DC

## 2012-07-28 MED ORDER — VITAMINS A & D EX OINT
TOPICAL_OINTMENT | CUTANEOUS | Status: AC
  Administered 2012-07-28: 5
  Filled 2012-07-28: qty 5

## 2012-07-28 NOTE — Progress Notes (Signed)
Pt had 4 bt run of SVT.  Pt is asymptomatic, vital signs are stable, MD was notified.  No new orders, will continue to monitor closely.

## 2012-07-28 NOTE — Progress Notes (Signed)
PROGRESS NOTE  Huston Foley. ZOX:[id number] DOB: [birth year] DOA: 07/27/2012 PCP: [name], MD  Brief narrative: 78 yr old male admit with Pseudomonal UTI 8/7, ?Met Prostate Ca  Past medical history-As per Problem list Chart reviewed as below- Paroxysmal A fibrillation last seen by Dr. Sharrell Ku 2009-, not on Coumadin right now  History GI bleed 04/26/2003-diverticulosis-gastroenterologist Dr. Randa Evens  History bladder cancer 1998 Rx cystoscopy without recurrent  Right eye retinal detachment status post posterior vitrectomy 10/26/2007  Acute on chronic pancreatitis with pseudocyst and pancreatic duct obstruction status post cholecystectomy 03/06/2011  Right hip femoral neck fracture repaired 10/11/2011 by Dr. Allie Bossier  Admission 06/13/2012 for enterococcal UTI  18 French coud tip catheter replaced by urology 06/28/2012 tender to gross hematuria  Consultants:  [facility]rology-[place]  Procedures:  Bone scan  Renal ultrasound  Antibiotics:  Rocephin 8/7   Subjective  Looks great feels much better family at bedside no nausea no vomiting no shortness of breath not blurred vision double vision   Objective    Interim History: Nursing notes reviewed PT has cleared him to go home with home health PT  Telemetry: Sinus rhythm  Objective: Filed Vitals:   07/28/12 0446 07/28/12 0947 07/28/12 1037 07/28/12 1300  BP: 112/61 112/62 111/60 123/69  Pulse: 72 88  77  Temp: 98 F (36.7 C)  98.4 F (36.9 C) 98.7 F (37.1 C)  TempSrc: Oral  Oral Oral  Resp: 22  20 19   Height:      Weight:      SpO2: 98%  97% 95%    Intake/Output Summary (Last 24 hours) at 07/28/12 1507 Last data filed at 07/28/12 0449  Gross per 24 hour  Intake      0 ml  Output   1150 ml  Net  -1150 ml    Exam: General: Alert pleasant Caucasian male in no apparent distress Eyes: Scleral icterus or pallor ENT: Soft supple no bruit no JVD, poor dentition and has dentures throat clear  Cardiovascular: S1-S2 no murmur rub or gallop regular rate and rhythm Respiratory: Clinically clear no added sound Abdomen: Soft nontender nondistended, full catheter in place Skin: Onychogryphosis  Data Reviewed: Basic Metabolic Panel:  Lab 07/28/12 8119 07/27/12 1537  NA 137 133*  K 3.6 4.2  CL 104 97  CO2 24 23  GLUCOSE 99 127*  BUN 26* 26*  CREATININE 1.15 1.29  CALCIUM 8.3* 8.9  MG -- --  PHOS -- --   Liver Function Tests:  Lab 07/28/12 0405 07/27/12 1537  AST 48* 67*  ALT 20 23  ALKPHOS 66 81  BILITOT 0.2* 0.4  PROT 6.1 7.3  ALBUMIN 2.3* 2.7*   No results found for this basename: LIPASE:5,AMYLASE:5 in the last 168 hours No results found for this basename: AMMONIA:5 in the last 168 hours CBC:  Lab 07/28/12 0405 07/27/12 1537  WBC 9.1 7.9  NEUTROABS -- 6.7  HGB 10.5* 12.7*  HCT 32.4* 39.1  MCV 95.0 93.8  PLT 275 304   Cardiac Enzymes: No results found for this basename: CKTOTAL:5,CKMB:5,CKMBINDEX:5,TROPONINI:5 in the last 168 hours BNP: No components found with this basename: POCBNP:5 CBG:  Lab 07/28/12 0810  GLUCAP 135*    Recent Results (from the past 240 hour(s))  URINE CULTURE     Status: Normal   Collection Time   07/26/12  3:35 AM      Component Value Range Status Comment   Specimen Description URINE, CATHETERIZED   Final    Special Requests  NONE   Final    Culture  Setup Time 07/26/2012 10:02   Final    Colony Count >=100,000 COLONIES/ML   Final    Culture PSEUDOMONAS AERUGINOSA   Final    Report Status 07/28/2012 FINAL   Final    Organism ID, Bacteria PSEUDOMONAS AERUGINOSA   Final      Studies:              All Imaging reviewed and is as per above notation   Scheduled Meds:   . acetaminophen  650 mg Oral Once  . aspirin EC  81 mg Oral Daily  . ceFEPime (MAXIPIME) IV  1 g Intravenous Q12H  . docusate sodium  100 mg Oral BID  . finasteride  5 mg Oral Daily  . heparin  5,000 Units Subcutaneous Q8H  . metoprolol  25 mg Oral Daily  .  mirtazapine  15 mg Oral QHS  . saccharomyces boulardii  250 mg Oral TID  . sodium chloride  1,000 mL Intravenous Once  . DISCONTD: ceFEPime (MAXIPIME) IV  1 g Intravenous Q12H   Continuous Infusions:    Assessment/Plan: 1. Metabolic encephalopathy-probably secondary to sepsis, resolved now 2. Sepsis secondary to Pyelonephritis-patient has been treated with 2 doses of oral Ceftin dinner-patient also has metastatic prostate cancer likely causing incontinence vs. obstruction.  Discontinue IV fluids today for fluids, continue Rocephin until tomorrow morning and transition to by mouth cefepime/Ceftazidime 3. Metastatic prostate cancer-patient schedule for truss later on this month.  Appreciate neurology input-please comment on whether patient needs prophylactic suppressive therapy for UTI-my understanding is patient might need androgen depravation therapy vs. TURP as an op 4. Paroxysmal atrial fibrillation, [place] score about 2-stable at current time, not a Coumadin candidate, decreased dose metoprolol to 12.5 twice a day given low blood pressure blood pressure-start aspirin 81 mg daily 5. History chronic pancreatitis-stable 6. History of bladder cancer-per urologist   Code Status: DO NOT RESUSCITATE Family Communication: Updated daughter at bedside Disposition Plan: Inpatient   Pleas Koch, MD  [facility] Pager [phone] 07/28/2012, 3:07 PM    LOS: 1 day

## 2012-07-28 NOTE — Progress Notes (Signed)
Pt has been desatting throughout the night, applied 2L O2 . Still continues to desat, but within seconds returns back in the 90s. Pt not in any acute distress, will continue to monitor.

## 2012-07-28 NOTE — Progress Notes (Signed)
INITIAL ADULT NUTRITION ASSESSMENT Date: 07/28/2012   Time: 12:16 PM Reason for Assessment: Nutrition risk for unintentional weight loss > 10 lb over 1 month  ASSESSMENT: Male 78 y.o.  Dx: Fever  Hx:  Past Medical History  Diagnosis Date  . Pancreatitis   . Fractured hip 09/2011  . UTI (urinary tract infection)   . Prostate tumor     to have bx by Dr. Margarita Grizzle  . UTI (lower urinary tract infection) 06/13/2012    has had x 3 months  . Falls 06/13/12    hit head approx. 1 month ago  . Unsteady gait 06/13/2012    "bad for awhile but getting worse recently"  . Slurred speech     slurred speech on 06/12/12 - didn't last long, slower talking recently  . Cancer     bladder  . Lung abnormality     spot on ct approx 3 months ago - to be followed - [facility]  . 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 Nose fracture     after a fall - Oct. 2012    Related Meds:  Scheduled Meds:   . acetaminophen  650 mg Oral Once  . aspirin EC  81 mg Oral Daily  . ceFEPime (MAXIPIME) IV  1 g Intravenous Q12H  . docusate sodium  100 mg Oral BID  . finasteride  5 mg Oral Daily  . heparin  5,000 Units Subcutaneous Q8H  . metoprolol  25 mg Oral Daily  . mirtazapine  15 mg Oral QHS  . saccharomyces boulardii  250 mg Oral TID  . sodium chloride  1,000 mL Intravenous Once  . DISCONTD: ceFEPime (MAXIPIME) IV  1 g Intravenous Q12H   Continuous Infusions:  PRN Meds:.acetaminophen, acetaminophen, HYDROcodone-acetaminophen, zolpidem   Ht: 5\' 9"  (175.3 cm)  Wt: 147 lb 11.3 oz (67 kg)  Ideal Wt: 72.7 kg % Ideal Wt: 91.8%  Usual Wt: 167 lb during recent admission on 06/14/12 % Usual Wt: 88%  Body mass index is 21.81 kg/(m^2). (WNL)  Food/Nutrition Related Hx: Per previous RD note, pt with poor appetite over the past year. Patient reported his appetite and intake have improved. He reported he drinks Ensure BID at home.   Labs:  CMP     Component Value Date/Time   NA 137 07/28/2012  0405   K 3.6 07/28/2012 0405   CL 104 07/28/2012 0405   CO2 24 07/28/2012 0405   GLUCOSE 99 07/28/2012 0405   BUN 26* 07/28/2012 0405   CREATININE 1.15 07/28/2012 0405   CALCIUM 8.3* 07/28/2012 0405   PROT 6.1 07/28/2012 0405   ALBUMIN 2.3* 07/28/2012 0405   AST 48* 07/28/2012 0405   ALT 20 07/28/2012 0405   ALKPHOS 66 07/28/2012 0405   BILITOT 0.2* 07/28/2012 0405   GFRNONAA 57* 07/28/2012 0405   GFRAA 66* 07/28/2012 0405    Intake/Output Summary (Last 24 hours) at 07/28/12 1220 Last data filed at 07/28/12 0449  Gross per 24 hour  Intake      0 ml  Output   1150 ml  Net  -1150 ml     Diet Order: Cardiac  Supplements/Tube Feeding: none at this time  IVF:    Estimated Nutritional Needs:   Kcal: 9147-8295 Protein: 100 grams Fluid: 1 ml per kcal intake  NUTRITION DIAGNOSIS: -Increased nutrient needs (NI-5.1).  Status: Ongoing  RELATED TO: diagnosis of prostate cancer and related treatment symptoms  AS EVIDENCE BY: increased calorie  and protein needs to promote weight maintenance.   MONITORING/EVALUATION(Goals): PO intake, weights, labs, I/O's 1. PO intake > 75% at meals and supplements.  2. Promote weight maintenance.  EDUCATION NEEDS: -No education needs identified at this time  INTERVENTION: 1. Will order patient Ensure BID, provides 500 kcal and 18 grams of protein daily.  2. Will order patient Resource Breeze once daily, provides 250 kcal and 9 grams of protein daily.  3. RD to follow for nutrition plan of care.   Dietitian [phone]  DOCUMENTATION CODES Per approved criteria  -Not Applicable    Dewel, Lotter [facility] 07/28/2012, 12:16 PM

## 2012-07-28 NOTE — ED Provider Notes (Signed)
Medical screening examination/treatment/procedure(s) were conducted as a shared visit with non-physician practitioner(s) and myself.  I personally evaluated the patient during the encounter.  Pt with fevers. Urine culture from 2 days ago show psuedomonas. Will give Cefepime, admit. No concerns for sepsis at this point based on vitals.  Derwood Kaplan, MD 07/28/12 0145

## 2012-07-28 NOTE — Consult Note (Signed)
Urology Consult  CC: UTI  HPI: 78 year old male with high risk prostate cancer. He was recently diagnosed with Gleason 4+5 = prostate cancer. Thus far, his staging has been negative. He was scheduled to have a bone scan earlier this week, but was unable to do this due to his illness. He was admitted yesterday with a 3-4 day history of high fever to 103-104. He has pseudomonas growing in his urine. He has an indwelling catheter.he has been experiencing bladder spasms in the past, but these have improved while on Toviaz.  I explained to the patient and his daughter today that my recommendation would be to start androgen deprivation therapy given that this is a disease that can advance rapidly. His disease may also be contributing to his obstruction. We discussed the risks and benefits as well as the side effects of this treatment. I explained this could worsen his fatigue. My goal would be to try to get him catheter free which may be accomplished with androgen deprivation therapy alone or may also require a TURP. I explained the procedure, the recovery, as well as the risks and benefits.  Before starting androgen deprivation therapy, he would need to recover from his urinary tract infection. I would also like to establish a baseline bone scan.    PMH: Past Medical History  Diagnosis Date  . Pancreatitis   . Fractured hip 09/2011  . UTI (urinary tract infection)   . Prostate tumor     to have bx by Dr. Margarita Grizzle  . UTI (lower urinary tract infection) 06/13/2012    has had x 3 months  . Falls 06/13/12    hit head approx. 1 month ago  . Unsteady gait 06/13/2012    "bad for awhile but getting worse recently"  . Slurred speech     slurred speech on 06/12/12 - didn't last long, slower talking recently  . Cancer     bladder  . Lung abnormality     spot on ct approx 3 months ago - to be followed - [facility]  . 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 Nose fracture     after a fall  - Oct. 2012    PSH: Past Surgical History  Procedure Date  . Cholecystectomy   . Partial hip arthroplasty   . Cystoscopy     has yearly due to bladder cancer  . Retinal detachment surgery     at least 3 and have been in both eyes    Allergies: No Known Allergies  Medications: Prescriptions prior to admission  Medication Sig Dispense Refill  . acetaminophen (TYLENOL) 500 MG tablet Take 500-1,000 mg by mouth every 6 (six) hours as needed. For pain.      Marland Kitchen b complex vitamins tablet Take 1 tablet by mouth daily.      . cefdinir (OMNICEF) 300 MG capsule Take 300 mg by mouth 2 (two) times daily. 10 day course of therapy; started 07/26/12      . Ensure Plus (ENSURE PLUS) LIQD Take 237 mLs by mouth 2 (two) times daily between meals.      . fesoterodine (TOVIAZ) 4 MG TB24 Take 4 mg by mouth daily.      . finasteride (PROSCAR) 5 MG tablet Take 5 mg by mouth daily.      Marland Kitchen gemfibrozil (LOPID) 600 MG tablet Take 600 mg by mouth 2 (two) times daily before a meal.      . metoprolol (  LOPRESSOR) 50 MG tablet Take 25 mg by mouth daily.       . mirtazapine (REMERON) 15 MG tablet Take 15 mg by mouth at bedtime.      . saccharomyces boulardii (FLORASTOR) 250 MG capsule Take 250 mg by mouth 3 (three) times daily.         Social History: History   Social History  . Marital Status: Married    Spouse Name: N/A    Number of Children: N/A  . Years of Education: N/A   Occupational History  . Not on file.   Social History Main Topics  . Smoking status: Current Everyday Smoker -- 0.2 packs/day for 62 years  . Smokeless tobacco: Never Used  . Alcohol Use: No  . Drug Use: No  . Sexually Active: Not on file   Other Topics Concern  . Not on file   Social History Narrative  . No narrative on file    Family History: Family History  Problem Relation Age of Onset  . Family history unknown: Yes    Review of Systems: Positive: Fever, bladder spasms, malaise, weakness. Negative: Chest pain,  SOB, diarrhea.  A further 10 point review of systems was negative except what is listed in the HPI.  Physical Exam:  General: No acute distress.  Awake. Head:  Normocephalic.  Atraumatic. ENT:  EOMI.  Mucous membranes moist Neck:  Supple.  No lymphadenopathy. CV:  S1 present. S2 present. Regular rate. Pulmonary: Equal effort bilaterally.  Clear to auscultation bilaterally. Abdomen: Soft.  Non- tender to palpation. Skin:  Normal turgor.  No visible rash. Extremity: No gross deformity of bilateral upper extremities.  No gross deformity of    bilateral lower extremities. Neurologic: Alert. Appropriate mood.  Penis:  Circumcised.  No lesions. Urethra: Foley catheter in place.  Orthotopic meatus. Scrotum: No lesions.  No ecchymosis.  No erythema.  Studies:  Recent Labs  [facility] 07/28/12 0405 07/27/12 1537   HGB 10.5* 12.7*   WBC 9.1 7.9   PLT 275 304    Recent Labs  Basename 07/28/12 0405 07/27/12 1537   NA 137 133*   K 3.6 4.2   CL 104 97   CO2 24 23   BUN 26* 26*   CREATININE 1.15 1.29   CALCIUM 8.3* 8.9   GFRNONAA 57* 50*   GFRAA 66* 57*     No results found for this basename: PT:2,INR:2,APTT:2 in the last 72 hours   No components found with this basename: ABG:2    Assessment:  Fever. UTI. High risk prostate cancer.  Plan: -Prostate cancer: still in the process of staging. No evidence at this point of metastatic disease. Need to obtain bone scan; will order this for inpatient. -Renal US- to ensure there is no obstruction contributing to his UTI. -Continue catheter to drainage and Cefepime. -Will check testosterone level. -Will follow.    Pager: [phone]

## 2012-07-28 NOTE — Evaluation (Signed)
Physical Therapy Evaluation Patient Details Name: Brent Day. MRN: [id number] DOB: 11/09/1927 Today's Date: 07/28/2012 Time: 4098-1191 PT Time Calculation (min): 27 min  PT Assessment / Plan / Recommendation Clinical Impression  Prostate cancer: still in the process of staging. No evidence at this point of metastatic disease. Need to obtain bone scan; Pt will benefit from PT to maximize independence for increased safety at home and fall prevention. Pt does have a strong hx of falls.    PT Assessment  Patient needs continued PT services    Follow Up Recommendations  Home health PT    Barriers to Discharge None      Equipment Recommendations  None recommended by PT    Recommendations for Other Services     Frequency Min 3X/week    Precautions / Restrictions Precautions Precautions: Fall   Pertinent Vitals/Pain Pt had 4 beat run of SVT at 1030 this morning; per RN currently pt in NSR and ok for activity Sats in upper 90s on RA and HR in 70s throughout session      Mobility  Bed Mobility Bed Mobility: Supine to Sit Supine to Sit: 5: Supervision Details for Bed Mobility Assistance: increased time Transfers Transfers: Sit to Stand;Stand to Sit Sit to Stand: 4: Min assist Stand to Sit: 4: Min assist Details for Transfer Assistance: verbal cue for hand placement and safety Ambulation/Gait Ambulation/Gait Assistance: 4: Min assist Ambulation Distance (Feet): 120 Feet Assistive device: Rolling walker Ambulation/Gait Assistance Details: verbal cues for RW distance from self  Gait Pattern: Step-through pattern;Decreased stride length    Exercises     PT Diagnosis: Difficulty walking  PT Problem List: Decreased strength;Decreased range of motion;Decreased activity tolerance;Decreased balance;Decreased mobility;Decreased knowledge of use of DME;Decreased safety awareness PT Treatment Interventions: DME instruction;Gait training;Stair training;Functional mobility  training;Therapeutic exercise;Therapeutic activities;Patient/family education   PT Goals Acute Rehab PT Goals PT Goal Formulation: With patient Time For Goal Achievement: 08/11/12 Potential to Achieve Goals: Good Pt will go Supine/Side to Sit: with modified independence PT Goal: Supine/Side to Sit - Progress: Goal set today Pt will go Sit to Stand: with modified independence PT Goal: Sit to Stand - Progress: Goal set today Pt will Ambulate: >150 feet;with supervision;with rolling walker PT Goal: Ambulate - Progress: Goal set today  Visit Information  Last PT Received On: 07/28/12 Assistance Needed: +1    Subjective Data  Subjective: I am ok I think Patient Stated Goal: get better   Prior Functioning  Home Living Lives With: Son;Daughter;Spouse (dtr is CNA) Available Help at Discharge: Available 24 hours/day;Family Type of Home: House Home Access: Stairs to enter [employer] of Steps: 1 Home Layout: One level Home Adaptive Equipment: Walker - rolling;Shower chair with back;Shower chair without back Additional Comments: son and 2 daughters assist with care of him and his wife Prior Function Level of Independence: Independent with assistive device(s) Driving: Yes (doesn't much anymore) Communication Communication: No difficulties    Cognition  Overall Cognitive Status: Appears within functional limits for tasks assessed/performed Arousal/Alertness: Awake/alert Orientation Level: Appears intact for tasks assessed Behavior During Session: [facility] for tasks performed    Extremity/Trunk Assessment Right Upper Extremity Assessment RUE ROM/Strength/Tone: [facility] for tasks assessed Left Upper Extremity Assessment LUE ROM/Strength/Tone: [facility] for tasks assessed Right Lower Extremity Assessment RLE ROM/Strength/Tone: [facility] for tasks assessed Left Lower Extremity Assessment LLE ROM/Strength/Tone: [facility] for tasks assessed   Balance Balance Balance Assessed:  (pt with hx of  multiple falls) Static Standing Balance Static Standing - Balance Support: Bilateral  upper extremity supported Static Standing - Level of Assistance: 4: Min assist;5: Stand by assistance  End of Session PT - End of Session Equipment Utilized During Treatment: Gait belt Activity Tolerance: Patient tolerated treatment well Patient left: in chair;with call bell/phone within reach;with family/visitor present Nurse Communication: Mobility status  GP     [facility] 07/28/2012, 1:46 PM

## 2012-07-29 ENCOUNTER — Inpatient Hospital Stay (HOSPITAL_COMMUNITY)
Admission: RE | Admit: 2012-07-29 | Discharge: 2012-07-29 | Payer: Medicare Other | Source: Ambulatory Visit | Attending: Urology | Admitting: Urology

## 2012-07-29 ENCOUNTER — Encounter (HOSPITAL_COMMUNITY): Payer: Medicare Other

## 2012-07-29 ENCOUNTER — Encounter (HOSPITAL_COMMUNITY): Payer: Self-pay | Admitting: Cardiology

## 2012-07-29 DIAGNOSIS — I472 Ventricular tachycardia: Secondary | ICD-10-CM

## 2012-07-29 DIAGNOSIS — E860 Dehydration: Secondary | ICD-10-CM

## 2012-07-29 LAB — GLUCOSE, CAPILLARY: Glucose-Capillary: 96 mg/dL (ref 70–99)

## 2012-07-29 LAB — CBC WITH DIFFERENTIAL/PLATELET
Basophils Absolute: 0 10*3/uL (ref 0.0–0.1)
Basophils Relative: 0 % (ref 0–1)
Eosinophils Absolute: 0.5 10*3/uL (ref 0.0–0.7)
Eosinophils Relative: 5 % (ref 0–5)
Lymphocytes Relative: 22 % (ref 12–46)
MCH: 31 pg (ref 26.0–34.0)
MCHC: 33.1 g/dL (ref 30.0–36.0)
MCV: 93.6 fL (ref 78.0–100.0)
Monocytes Absolute: 0.7 10*3/uL (ref 0.1–1.0)
Platelets: 297 10*3/uL (ref 150–400)
RDW: 14.1 % (ref 11.5–15.5)
WBC: 9.6 10*3/uL (ref 4.0–10.5)

## 2012-07-29 MED ORDER — METOPROLOL TARTRATE 50 MG PO TABS
50.0000 mg | ORAL_TABLET | Freq: Every day | ORAL | Status: DC
  Administered 2012-07-29: 50 mg via ORAL
  Filled 2012-07-29: qty 1

## 2012-07-29 MED ORDER — METOPROLOL SUCCINATE ER 25 MG PO TB24
25.0000 mg | ORAL_TABLET | Freq: Every day | ORAL | Status: DC
  Administered 2012-07-29 – 2012-07-30 (×2): 25 mg via ORAL
  Filled 2012-07-29 (×2): qty 1

## 2012-07-29 NOTE — Progress Notes (Signed)
Pt's HR is 70 and SpO2 is 96% on room air at rest.  While ambulating HR is 83, with SpO2 of 94% on room air.

## 2012-07-29 NOTE — Progress Notes (Signed)
PROGRESS NOTE  Brent Day. ZOX:[id number] DOB: 06-13-1927 DOA: 07/27/2012 PCP: [name], MD  Brief narrative: 78 yr old male admit with Pseudomonal UTI 8/7, ?Met Prostate Ca  Past medical history-As per Problem list Chart reviewed as below- Paroxysmal A fibrillation last seen by Dr. Sharrell Ku 2009-, not on Coumadin right now  History GI bleed 04/26/2003-diverticulosis-gastroenterologist Dr. Randa Evens  History bladder cancer 1998 Rx cystoscopy without recurrent  Right eye retinal detachment status post posterior vitrectomy 10/26/2007  Acute on chronic pancreatitis with pseudocyst and pancreatic duct obstruction status post cholecystectomy 03/06/2011  Right hip femoral neck fracture repaired 10/11/2011 by Dr. Allie Bossier  Admission 06/13/2012 for enterococcal UTI  18 French coud tip catheter replaced by urology 06/28/2012 tender to gross hematuria  Consultants:  U[facility]-[place]  Procedures:  Bone scan  Renal ultrasound  Antibiotics:  Rocephin 8/7-8/8  Cefidinir-8/8>>   Subjective  Looks great feels much better family at bedside hungry which is deep Had a restless night according to nightmares Was little bit confused Heart rate seemed to drop and then had also some tachycardia last night    Objective    Interim History: Nursing notes reviewed PT has cleared him to go home with home health PT  Telemetry: Sinus rhythm-overnight had 25 beats of SVT   Objective: Filed Vitals:   07/28/12 1300 07/28/12 2158 07/29/12 0310 07/29/12 0545  BP: 123/69 100/63  120/69  Pulse: 77 79  80  Temp: 98.7 F (37.1 C) 99.5 F (37.5 C) 100.3 F (37.9 C) 99.7 F (37.6 C)  TempSrc: Oral Oral Oral Oral  Resp: 19 19  18   Height:      Weight:      SpO2: 95% 96%  96%    Intake/Output Summary (Last 24 hours) at 07/29/12 0750 Last data filed at 07/29/12 0401  Gross per 24 hour  Intake    100 ml  Output   1800 ml  Net  -1700 ml    Exam: General:  Alert pleasant Caucasian male in no apparent distress Eyes: Scleral icterus or pallor ENT: Soft supple no bruit no JVD, poor dentition and has dentures throat clear Cardiovascular: S1-S2 no murmur rub or gallop regular rate and rhythm at present time Respiratory: Clinically clear no added sound Abdomen: Soft nontender nondistended, full catheter in place Skin: Onychogryphosis  Data Reviewed: Basic Metabolic Panel:  Lab 07/28/12 8119 07/27/12 1537  NA 137 133*  K 3.6 4.2  CL 104 97  CO2 24 23  GLUCOSE 99 127*  BUN 26* 26*  CREATININE 1.15 1.29  CALCIUM 8.3* 8.9  MG -- --  PHOS -- --   Liver Function Tests:  Lab 07/28/12 0405 07/27/12 1537  AST 48* 67*  ALT 20 23  ALKPHOS 66 81  BILITOT 0.2* 0.4  PROT 6.1 7.3  ALBUMIN 2.3* 2.7*   No results found for this basename: LIPASE:5,AMYLASE:5 in the last 168 hours No results found for this basename: AMMONIA:5 in the last 168 hours CBC:  Lab 07/29/12 0445 07/28/12 0405 07/27/12 1537  WBC 9.6 9.1 7.9  NEUTROABS 6.4 -- 6.7  HGB 10.7* 10.5* 12.7*  HCT 32.3* 32.4* 39.1  MCV 93.6 95.0 93.8  PLT 297 275 304   Cardiac Enzymes: No results found for this basename: CKTOTAL:5,CKMB:5,CKMBINDEX:5,TROPONINI:5 in the last 168 hours BNP: No components found with this basename: POCBNP:5 CBG:  Lab 07/29/12 0743 07/28/12 0810  GLUCAP 96 135*    Recent Results (from the past 240 hour(s))  URINE CULTURE  Status: Normal   Collection Time   07/26/12  3:35 AM      Component Value Range Status Comment   Specimen Description URINE, CATHETERIZED   Final    Special Requests NONE   Final    Culture  Setup Time 07/26/2012 10:02   Final    Colony Count >=100,000 COLONIES/ML   Final    Culture PSEUDOMONAS AERUGINOSA   Final    Report Status 07/28/2012 FINAL   Final    Organism ID, Bacteria PSEUDOMONAS AERUGINOSA   Final   URINE CULTURE     Status: Normal   Collection Time   07/27/12  5:17 PM      Component Value Range Status Comment   Specimen  Description URINE, CATHETERIZED   Final    Special Requests NONE   Final    Culture  Setup Time 07/28/2012 03:18   Final    Colony Count NO GROWTH   Final    Culture NO GROWTH   Final    Report Status 07/28/2012 FINAL   Final      Studies:              All Imaging reviewed and is as per above notation   Scheduled Meds:    . aspirin EC  81 mg Oral Daily  . cefdinir  300 mg Oral BID  . docusate sodium  100 mg Oral BID  . feeding supplement  237 mL Oral BID BM  . feeding supplement  1 Container Oral QPC supper  . finasteride  5 mg Oral Daily  . heparin  5,000 Units Subcutaneous Q8H  . metoprolol  50 mg Oral Daily  . mirtazapine  15 mg Oral QHS  . saccharomyces boulardii  250 mg Oral TID  . vitamin A & D      . DISCONTD: cefdinir  300 mg Oral BID  . DISCONTD: ceFEPime (MAXIPIME) IV  1 g Intravenous Q12H  . DISCONTD: metoprolol  25 mg Oral Daily   Continuous Infusions:    Assessment/Plan: 1. Metabolic encephalopathy-probably secondary to sepsis, resolved now 2. Sepsis secondary to Pyelonephritis-patient has been treated with 2 doses of oral Ceftin dinner-patient also has metastatic prostate cancer likely causing incontinence vs. obstruction.  Discontinue IV fluids today for fluids, continue Rocephin until tomorrow morning and transition to by mouth cefepime/Ceftazidime-we will monitor overnight on cefdinir and insure that there are no fevers.  Repeat CBC plus differential tomorrow 3. Nonsustained ventricular tachycardia-adjusted his metoprolol from 25 mg extended release per day to home dose of 50 mg extended release this morning.  We will monitor him overnight to make sure that this is not recur. 4. Metastatic prostate cancer-patient schedule for truss later on this month.  Appreciate neurology input-please comment on whether patient needs prophylactic suppressive therapy for UTI-my understanding is patient might need androgen depravation therapy vs. TURP as an op 5. Paroxysmal  atrial fibrillation, [place] score about 2-stable at current time, not a Coumadin candidate-start aspirin 81 mg daily-metoprolol adjusted.  See above #3.  Would involve cardiology if blood pressure drops below 100/60, sinus tachycardia recurs 6. History chronic pancreatitis-stable 7. History of bladder cancer-per urologist   Code Status: DO NOT RESUSCITATE Family Communication: Updated daughter at bedside Disposition Plan: Inpatient   Pleas Koch, MD  [facility] Pager [phone] 07/29/2012, 7:50 AM    LOS: 2 days

## 2012-07-29 NOTE — Consult Note (Signed)
HPI: 78 yo male with PMH of PAF and prostate cancer for evaluation of brief paroxysmal atrial tachycardia and nonsustained ventricular tachycardia. Patient admitted August 7 with fever and recurrent urinary tract infection. Patient has been treated for pyelonephritis. He was noted to have brief paroxysmal atrial tachycardia and 3 beats of nonsustained ventricular tachycardia on telemetry. Cardiology is asked to evaluate. Patient denies dyspnea, chest pain, palpitations or syncope. He is a no CODE BLUE. Echocardiogram in 2008 showed an ejection fraction of 55-60%. There was mild aortic insufficiency.  Medications Prior to Admission  Medication Sig Dispense Refill  . acetaminophen (TYLENOL) 500 MG tablet Take 500-1,000 mg by mouth every 6 (six) hours as needed. For pain.      Marland Kitchen b complex vitamins tablet Take 1 tablet by mouth daily.      . cefdinir (OMNICEF) 300 MG capsule Take 300 mg by mouth 2 (two) times daily. 10 day course of therapy; started 07/26/12      . Ensure Plus (ENSURE PLUS) LIQD Take 237 mLs by mouth 2 (two) times daily between meals.      . fesoterodine (TOVIAZ) 4 MG TB24 Take 4 mg by mouth daily.      . finasteride (PROSCAR) 5 MG tablet Take 5 mg by mouth daily.      Marland Kitchen gemfibrozil (LOPID) 600 MG tablet Take 600 mg by mouth 2 (two) times daily before a meal.      . LORazepam (ATIVAN) 1 MG tablet Take 0.5 mg by mouth 3 (three) times daily as needed. As needed for anxiety. Takes 1/2-1 tab      . metoprolol (LOPRESSOR) 50 MG tablet Take 25 mg by mouth daily.       . mirtazapine (REMERON) 15 MG tablet Take 15 mg by mouth at bedtime.      . saccharomyces boulardii (FLORASTOR) 250 MG capsule Take 250 mg by mouth 3 (three) times daily.        No Known Allergies  Past Medical History  Diagnosis Date  . Pancreatitis   . Fractured hip 09/2011  . Prostate tumor     to have bx by Dr. Margarita Grizzle  . UTI (lower urinary tract infection) 06/13/2012    has had x 3 months  . Falls 06/13/12    hit  head approx. 1 month ago  . Unsteady gait 06/13/2012    "bad for awhile but getting worse recently"  . Slurred speech     slurred speech on 06/12/12 - didn't last long, slower talking recently  . Cancer     bladder  . Lung abnormality     spot on ct approx 3 months ago - to be followed - [facility]  . 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 Nose fracture     after a fall - Oct. 2012  . Hypertension     Past Surgical History  Procedure Date  . Cholecystectomy   . Partial hip arthroplasty   . Cystoscopy     has yearly due to bladder cancer  . Retinal detachment surgery     at least 3 and have been in both eyes    History   Social History  . Marital Status: Married    Spouse Name: N/A    Number of Children: N/A  . Years of Education: N/A   Occupational History  . Not on file.   Social History Main Topics  . Smoking status: Current Everyday Smoker -- 0.2 packs/day  for 62 years  . Smokeless tobacco: Never Used  . Alcohol Use: No  . Drug Use: No  . Sexually Active: Not on file   Other Topics Concern  . Not on file   Social History Narrative  . No narrative on file    Family History  Problem Relation Age of Onset  . Heart disease      No family history    ROS:  no fevers or chills, productive cough, hemoptysis, dysphasia, odynophagia, melena, hematochezia, dysuria, hematuria, rash, seizure activity, orthopnea, PND, pedal edema, claudication. Remaining systems are negative.  Physical Exam:   Blood pressure 102/60, pulse 66, temperature 98.1 F (36.7 C), temperature source Oral, resp. rate 18, height 5\' 9"  (1.753 m), weight 147 lb 11.3 oz (67 kg), SpO2 96.00%.  General:  Well developed/frail in NAD Skin warm/dry Patient not depressed No peripheral clubbing Back-normal HEENT-normal/normal eyelids Neck supple/normal carotid upstroke bilaterally; no bruits; no JVD; no thyromegaly chest - CTA/ normal expansion CV - RRR/normal S1 and S2; no rubs  or gallops;  PMI nondisplaced; 2/6 systolic ejection murmur. Abdomen -NT/ND, no HSM, no mass, + bowel sounds, no bruit 2+ femoral pulses, no bruits Ext-no edema, chords, 2+ DP, varicosities noted Neuro-grossly nonfocal  ECG not performed  Results for orders placed during the hospital encounter of 07/27/12 (from the past 48 hour(s))  CBC WITH DIFFERENTIAL     Status: Abnormal   Collection Time   07/27/12  3:37 PM      Component Value Range Comment   WBC 7.9  4.0 - 10.5 K/uL    RBC 4.17 (*) 4.22 - 5.81 MIL/uL    Hemoglobin 12.7 (*) 13.0 - 17.0 g/dL    HCT 91.4  78.2 - 95.6 %    MCV 93.8  78.0 - 100.0 fL    MCH 30.5  26.0 - 34.0 pg    MCHC 32.5  30.0 - 36.0 g/dL    RDW 21.3  08.6 - 57.8 %    Platelets 304  150 - 400 K/uL    Neutrophils Relative 86 (*) 43 - 77 %    Neutro Abs 6.7  1.7 - 7.7 K/uL    Lymphocytes Relative 9 (*) 12 - 46 %    Lymphs Abs 0.7  0.7 - 4.0 K/uL    Monocytes Relative 4  3 - 12 %    Monocytes Absolute 0.3  0.1 - 1.0 K/uL    Eosinophils Relative 1  0 - 5 %    Eosinophils Absolute 0.1  0.0 - 0.7 K/uL    Basophils Relative 0  0 - 1 %    Basophils Absolute 0.0  0.0 - 0.1 K/uL   COMPREHENSIVE METABOLIC PANEL     Status: Abnormal   Collection Time   07/27/12  3:37 PM      Component Value Range Comment   Sodium 133 (*) 135 - 145 mEq/L    Potassium 4.2  3.5 - 5.1 mEq/L    Chloride 97  96 - 112 mEq/L    CO2 23  19 - 32 mEq/L    Glucose, Bld 127 (*) 70 - 99 mg/dL    BUN 26 (*) 6 - 23 mg/dL    Creatinine, Ser 4.69  0.50 - 1.35 mg/dL    Calcium 8.9  8.4 - 62.9 mg/dL    Total Protein 7.3  6.0 - 8.3 g/dL    Albumin 2.7 (*) 3.5 - 5.2 g/dL    AST 67 (*) 0 -  37 U/L    ALT 23  0 - 53 U/L    Alkaline Phosphatase 81  39 - 117 U/L    Total Bilirubin 0.4  0.3 - 1.2 mg/dL    GFR calc non Af Amer 50 (*) >90 mL/min    GFR calc Af Amer 57 (*) >90 mL/min   URINALYSIS, ROUTINE W REFLEX MICROSCOPIC     Status: Abnormal   Collection Time   07/27/12  5:17 PM      Component Value  Range Comment   Color, Urine YELLOW  YELLOW    APPearance CLOUDY (*) CLEAR    Specific Gravity, Urine 1.015  1.005 - 1.030    pH 6.0  5.0 - 8.0    Glucose, UA NEGATIVE  NEGATIVE mg/dL    Hgb urine dipstick MODERATE (*) NEGATIVE    Bilirubin Urine NEGATIVE  NEGATIVE    Ketones, ur NEGATIVE  NEGATIVE mg/dL    Protein, ur 30 (*) NEGATIVE mg/dL    Urobilinogen, UA 0.2  0.0 - 1.0 mg/dL    Nitrite NEGATIVE  NEGATIVE    Leukocytes, UA LARGE (*) NEGATIVE   URINE CULTURE     Status: Normal   Collection Time   07/27/12  5:17 PM      Component Value Range Comment   Specimen Description URINE, CATHETERIZED      Special Requests NONE      Culture  Setup Time 07/28/2012 03:18      Colony Count NO GROWTH      Culture NO GROWTH      Report Status 07/28/2012 FINAL     URINE MICROSCOPIC-ADD ON     Status: Abnormal   Collection Time   07/27/12  5:17 PM      Component Value Range Comment   Squamous Epithelial / LPF FEW (*) RARE    WBC, UA TOO NUMEROUS TO COUNT  <3 WBC/hpf    RBC / HPF 11-20  <3 RBC/hpf    Bacteria, UA FEW (*) RARE   COMPREHENSIVE METABOLIC PANEL     Status: Abnormal   Collection Time   07/28/12  4:05 AM      Component Value Range Comment   Sodium 137  135 - 145 mEq/L    Potassium 3.6  3.5 - 5.1 mEq/L    Chloride 104  96 - 112 mEq/L    CO2 24  19 - 32 mEq/L    Glucose, Bld 99  70 - 99 mg/dL    BUN 26 (*) 6 - 23 mg/dL    Creatinine, Ser 4.09  0.50 - 1.35 mg/dL    Calcium 8.3 (*) 8.4 - 10.5 mg/dL    Total Protein 6.1  6.0 - 8.3 g/dL    Albumin 2.3 (*) 3.5 - 5.2 g/dL    AST 48 (*) 0 - 37 U/L    ALT 20  0 - 53 U/L    Alkaline Phosphatase 66  39 - 117 U/L    Total Bilirubin 0.2 (*) 0.3 - 1.2 mg/dL    GFR calc non Af Amer 57 (*) >90 mL/min    GFR calc Af Amer 66 (*) >90 mL/min   CBC     Status: Abnormal   Collection Time   07/28/12  4:05 AM      Component Value Range Comment   WBC 9.1  4.0 - 10.5 K/uL    RBC 3.41 (*) 4.22 - 5.81 MIL/uL    Hemoglobin 10.5 (*) 13.0 - 17.0 g/dL  HCT 32.4 (*) 39.0 - 52.0 %    MCV 95.0  78.0 - 100.0 fL    MCH 30.8  26.0 - 34.0 pg    MCHC 32.4  30.0 - 36.0 g/dL    RDW 16.1  09.6 - 04.5 %    Platelets 275  150 - 400 K/uL   GLUCOSE, CAPILLARY     Status: Abnormal   Collection Time   07/28/12  8:10 AM      Component Value Range Comment   Glucose-Capillary 135 (*) 70 - 99 mg/dL    Comment 1 Documented in Chart      Comment 2 Notify RN     TESTOSTERONE     Status: Abnormal   Collection Time   07/28/12  8:45 AM      Component Value Range Comment   Testosterone 247.20 (*) 300 - 890 ng/dL   CBC WITH DIFFERENTIAL     Status: Abnormal   Collection Time   07/29/12  4:45 AM      Component Value Range Comment   WBC 9.6  4.0 - 10.5 K/uL    RBC 3.45 (*) 4.22 - 5.81 MIL/uL    Hemoglobin 10.7 (*) 13.0 - 17.0 g/dL    HCT 40.9 (*) 81.1 - 52.0 %    MCV 93.6  78.0 - 100.0 fL    MCH 31.0  26.0 - 34.0 pg    MCHC 33.1  30.0 - 36.0 g/dL    RDW 91.4  78.2 - 95.6 %    Platelets 297  150 - 400 K/uL    Neutrophils Relative 67  43 - 77 %    Neutro Abs 6.4  1.7 - 7.7 K/uL    Lymphocytes Relative 22  12 - 46 %    Lymphs Abs 2.1  0.7 - 4.0 K/uL    Monocytes Relative 7  3 - 12 %    Monocytes Absolute 0.7  0.1 - 1.0 K/uL    Eosinophils Relative 5  0 - 5 %    Eosinophils Absolute 0.5  0.0 - 0.7 K/uL    Basophils Relative 0  0 - 1 %    Basophils Absolute 0.0  0.0 - 0.1 K/uL   GLUCOSE, CAPILLARY     Status: Normal   Collection Time   07/29/12  7:43 AM      Component Value Range Comment   Glucose-Capillary 96  70 - 99 mg/dL     Dg Chest 1 View (portable)  07/27/2012  *RADIOLOGY REPORT*  Clinical Data: Fever and weakness.  Prostate cancer.  CHEST - 1 VIEW  Comparison: Two-view chest 06/28/2012.  Findings: Emphysematous changes are again seen.  The heart size is within normal limits.  No focal airspace disease is evident.  The visualized soft tissues and bony thorax are unremarkable.  IMPRESSION:  1.  No acute cardiopulmonary disease or significant interval  change. 2.  Emphysema.  Original Report Authenticated By: Jamesetta Orleans. MATTERN, M.D.   Nm Bone Scan Whole Body  07/28/2012  *RADIOLOGY REPORT*  Clinical Data: Gleason 9 score prostate cancer.  Prior rib fractures  NUCLEAR MEDICINE WHOLE BODY BONE SCINTIGRAPHY  Technique:  Whole body anterior and posterior images were obtained approximately 3 hours after intravenous injection of radiopharmaceutical.  Radiopharmaceutical: CURIE TC-MDP TECHNETIUM TC 51M MEDRONATE IV KIT  Comparison: Chest radiograph 06/13/2012, CT abdomen/pelvis 04/08/2012  Findings: Foci of abnormal radiotracer uptake are noted over multiple left sided ribs, oriented in a curvilinear fashion.  These are  of varying intensity, which may suggest varying ages. Those involving the right posterior tenth and eleventh ribs are seen on the prior dissimilar exam and at least several anterolateral rib fractures were also previously visualized.  Physiologic uptake by the kidneys and within the nondilated renal collecting system is noted.  Foci of uptake over the right hand are compatible with injection site.  IMPRESSION: Foci of radiotracer uptake over the left posterolateral ribs, compatible with rib fractures of varying ages, partly visualized on prior exams.  No scintigraphic evidence for metastatic disease.  Original Report Authenticated By: Harrel Lemon, M.D.   US Renal  07/28/2012  *RADIOLOGY REPORT*  Clinical Data:  Urinary tract infection.  High risk for prostate cancer  RENAL/URINARY TRACT ULTRASOUND COMPLETE  Comparison:  February 28, 2011.  Findings:  Right Kidney:  There is mild cortical thinning.  Normal in size, measuring 10.9 cm in length.  There are multiple simple cysts, the largest is at the mid - inferior pole, measuring 4.2 cm, unchanged.  No evidence of mass or hydronephrosis.  Left Kidney: Normal in size and echotexture, measuring 11.1 cm. Multiple simple cysts are present, the largest is at the inferior pole, measuring 1.6  cm.  No evidence of mass or hydronephrosis.  Bladder: A Foley catheter is in place. A 3.5 cm bladder diverticulum is noted, similar to the CT scan dated April 08, 2012.  Multiple simple hepatic cysts are incidentally noted, as on prior CT scan dated April 08, 2012.  IMPRESSION: There are bilateral renal cysts, unchanged.  There is mild renal cortical thinning on the right.  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  Original Report Authenticated By: Brandon Melnick, M.D.    Assessment/Plan #1-nonsustained ventricular tachycardia-continue beta blocker. This is not unexpected in a patient with ongoing pyelonephritis. He is not having symptoms and his previous LV function is normal. No further evaluation. #2-paroxysmal atrial tachycardia-patient had brief runs of PAT on monitor. However he is in sinus rhythm. Continue beta blocker. #3-bradycardia-the patient is noted to have occasional sinus bradycardia into the high 30s while asleep. Change Lopressor to Toprol 25 mg daily. #4-history of paroxysmal atrial fibrillation-continue beta blocker and aspirin. Patient not a Coumadin candidate given history of recurrent falls. #5-urinary tract infection-management per primary care. #6-prostate cancer. Patient can followup with Dr. Ladona Ridgel as scheduled following discharge. We will sign off. Please call with questions.  Olga Millers MD 07/29/2012, 3:16 PM

## 2012-07-29 NOTE — Plan of Care (Signed)
Problem: Phase I Progression Outcomes Goal: Voiding-avoid urinary catheter unless indicated Outcome: Not Met (add Reason) Pt has chronic foley for urology purposes.

## 2012-07-29 NOTE — Progress Notes (Signed)
Physical Therapy Treatment Patient Details Name: Brent Day. MRN: [id number] DOB: [birth year] Today's Date: 07/29/2012 Time: 1478-2956 PT Time Calculation (min): 15 min  PT Assessment / Plan / Recommendation Comments on Treatment Session  Pt agreeable to ambulation.  SaO2 98% room air and HR 80 bpm preactivity.  HR 80 bpm with ambulation and 71 bpm upon returning to supine with SaO2 97% room air.      Follow Up Recommendations  Home health PT    Barriers to Discharge        Equipment Recommendations  None recommended by PT    Recommendations for Other Services    Frequency     Plan Discharge plan remains appropriate;Frequency remains appropriate    Precautions / Restrictions Precautions Precautions: Fall   Pertinent Vitals/Pain No pain, see vitals in ambulation section    Mobility  Bed Mobility Bed Mobility: Supine to Sit Supine to Sit: 5: Supervision Details for Bed Mobility Assistance: increased time Transfers Transfers: Stand to Sit;Sit to Stand Sit to Stand: 4: Min assist;With upper extremity assist;From bed Stand to Sit: 4: Min guard;With upper extremity assist;To bed Details for Transfer Assistance: verbal cues for hand placement, assist to rise (pt awakened from sleep prior to therapy) Ambulation/Gait Ambulation/Gait Assistance: 4: Min assist Ambulation Distance (Feet): 200 Feet Assistive device: Rolling walker Ambulation/Gait Assistance Details: verbal cue for RW distance, HR 80 bpm per telemetry monitor, assist upon initiating gait Gait Pattern: Step-through pattern;Decreased stride length;Trunk flexed    Exercises     PT Diagnosis:    PT Problem List:   PT Treatment Interventions:     PT Goals Acute Rehab PT Goals PT Goal: Supine/Side to Sit - Progress: Progressing toward goal PT Goal: Sit to Stand - Progress: Progressing toward goal PT Goal: Ambulate - Progress: Progressing toward goal  Visit Information  Last PT Received On:  07/29/12 Assistance Needed: +1    Subjective Data  Subjective: "Hey, Brent Day from physical therapy."   Cognition  Overall Cognitive Status: Appears within functional limits for tasks assessed/performed    Balance     End of Session PT - End of Session Equipment Utilized During Treatment: Gait belt Activity Tolerance: Patient tolerated treatment well Patient left: in bed;with call bell/phone within reach;with bed alarm set   GP     Brent Day,Brent Day 07/29/2012, 3:58 PM Pager: [phone]

## 2012-07-29 NOTE — Progress Notes (Signed)
Pt had a 23 beat run of SVT, pt was asleep and asymptomatic. Benedetto Coons was notified and made aware. Pt had a 4 beat run of SVT the morning of 8/8.

## 2012-07-29 NOTE — Progress Notes (Signed)
Urology follow up:  08/03/12 at 10:00 am for firmagon injection. 08/16/12 at 12:45 with me for catheter removal.

## 2012-07-29 NOTE — Progress Notes (Signed)
Pt had 19 beat run of SVT. Pt is asymptomatic, vital signs are stable, MD notified, will continue to monitor closely.

## 2012-07-29 NOTE — Progress Notes (Signed)
Pt having an episode of HR dropping to 40's and 50's, only for a second or two, then back up to the 60's.  BP and SpO2 still stable.  MD notified.  Will continue to monitor.

## 2012-07-29 NOTE — Progress Notes (Signed)
Urology Progress Note  Subjective:     No acute urologic events overnight. Renal ultrasound yesterday negative for hydronephrosis. Bone scan negative for evidence of metastatic disease. This completes his staging workup and shows no evidence of metastatic disease. The patient is feeling much from her today.  ROS: Negative: chest pain  Objective:  Patient Vitals for the past 24 hrs:  BP Temp Temp src Pulse Resp SpO2  07/29/12 0545 120/69 mmHg 99.7 F (37.6 C) Oral 80  18  96 %  07/29/12 0310 - 100.3 F (37.9 C) Oral - - -  07/28/12 2158 100/63 mmHg 99.5 F (37.5 C) Oral 79  19  96 %  07/28/12 1300 123/69 mmHg 98.7 F (37.1 C) Oral 77  19  95 %  07/28/12 1037 111/60 mmHg 98.4 F (36.9 C) Oral - 20  97 %  07/28/12 0947 112/62 mmHg - - 88  - -    Physical Exam: General:  No acute distress, awake Cardiovascular:    [x]   S1/S2 present, RRR  []   Irregularly irregular Chest:  CTA-B Abdomen:               []  Soft, appropriately TTP  [x]  Soft, NTTP  []  Soft, appropriately TTP, incision(s) clean/dry/intact  Genitourinary: Foley in place. Negative genital edema. Foley:  Draining clear yellow urine.    I/O last 3 completed shifts: In: 100 [IV Piggyback:100] Out: 2950 [Urine:2950]  Recent Labs  [facility] 07/29/12 0445 07/28/12 0405   HGB 10.7* 10.5*   WBC 9.6 9.1   PLT 297 275    Recent Labs  Basename 07/28/12 0405 07/27/12 1537   NA 137 133*   K 3.6 4.2   CL 104 97   CO2 24 23   BUN 26* 26*   CREATININE 1.15 1.29   CALCIUM 8.3* 8.9   GFRNONAA 57* 50*   GFRAA 66* 57*     No results found for this basename: PT:2,INR:2,APTT:2 in the last 72 hours   No components found with this basename: ABG:2    Length of stay: 2 days.  Assessment: UTI. High-risk prostate cancer without evidence of metastatic disease. Plan: -I do not recommend starting prophylactic antibiotics on this patient as he will continue to need indwelling catheter. Prophylactic antibiotics would  likely only contribute to worsening of bacterial resistance.  -I will plan on having the patient followup next week in my clinic for his first dose of androgen deprivation therapy. We will attempt a voiding trial approximately 2 weeks after that.  -Please continue to treat the patient according to sensitivities from his urine culture.   Natalia Leatherwood, MD [phone]

## 2012-07-30 LAB — COMPREHENSIVE METABOLIC PANEL
AST: 37 U/L (ref 0–37)
Albumin: 2.3 g/dL — ABNORMAL LOW (ref 3.5–5.2)
Alkaline Phosphatase: 66 U/L (ref 39–117)
BUN: 26 mg/dL — ABNORMAL HIGH (ref 6–23)
Chloride: 106 mEq/L (ref 96–112)
Potassium: 3.5 mEq/L (ref 3.5–5.1)
Total Bilirubin: 0.3 mg/dL (ref 0.3–1.2)
Total Protein: 6.6 g/dL (ref 6.0–8.3)

## 2012-07-30 LAB — CBC WITH DIFFERENTIAL/PLATELET
Basophils Absolute: 0 10*3/uL (ref 0.0–0.1)
Basophils Relative: 0 % (ref 0–1)
Eosinophils Absolute: 0.4 10*3/uL (ref 0.0–0.7)
MCH: 30.2 pg (ref 26.0–34.0)
MCHC: 31.7 g/dL (ref 30.0–36.0)
Neutro Abs: 5.1 10*3/uL (ref 1.7–7.7)
Neutrophils Relative %: 62 % (ref 43–77)
Platelets: 344 10*3/uL (ref 150–400)
RDW: 14 % (ref 11.5–15.5)

## 2012-07-30 LAB — GLUCOSE, CAPILLARY: Glucose-Capillary: 108 mg/dL — ABNORMAL HIGH (ref 70–99)

## 2012-07-30 MED ORDER — METOPROLOL SUCCINATE ER 25 MG PO TB24: 25.0000 mg | ORAL_TABLET | Freq: Every day | ORAL | Status: DC

## 2012-07-30 NOTE — Discharge Summary (Signed)
Physician Discharge Summary  Brent Day. AVW:[id number] DOB: 06/18/1927 DOA: 07/27/2012  PCP: [name], MD  Admit date: 07/27/2012 Discharge date: 07/30/2012  Recommendations for Outpatient Follow-up:  1. Needs Cardiology (Dr. Ladona Ridgel) f/u 2. Urology f/u as below 3. Ultimately will require goals of care   Discharge Diagnoses:  Active Problems:  Essential hypertension, benign  Atrial fibrillation  SINUS BRADYCARDIA  Generalized weakness  FTT (failure to thrive) in adult  Frequent falls  Dehydration  Nausea  UTI (lower urinary tract infection)  Cancer   Discharge Condition: Stable  Diet recommendation: Low-salt heart healthy  Wt Readings from Last 3 Encounters:  07/27/12 67 kg (147 lb 11.3 oz)  06/28/12 65.772 kg (145 lb)  06/15/12 66.996 kg (147 lb 11.2 oz)    History of present illness:  78 yr old male presented 07/27/12 to the ED after being seen 07/25/12 in the Ed. He has chronic UTI issues and retention of urine which has been chronic-He has been Rx by Dr. Margarita Grizzle for this. He had a catheter which was "way too large" and has had significant spasms-because the spasms were severe, the Catheter was removed and he felt better-he has had what seems to daughter to be [place] from [place] and she thought he might have UTI's-he hasn't also really been sleeping because of the pain  He started to have a fever last night and was called in Cefidin 300 mg which he took last pm last night and this am (1st of 10 days).  Daughter relates has had significant weigth loss and he does seem to have an aggressive type of Prostate cancer and was supposed to have a Bone scan 8/7but was too sick to get this done today (now re-scheduled till 8/12). Noted that his urine culture taken 07/25/2012 grew 100,000 colony forming units of pseudomonas.  Please see below for the rest of the hospital course   Hospital Course:   1. Metabolic encephalopathy-probably secondary to sepsis, resolved  now. 2. Sepsis secondary to Pyelonephritis-patient has been treated with 2 doses of oral Cefidinir-patient also has metastatic prostate cancer likely causing incontinence vs. obstruction.  Patient initially placed on IV fluid repletion, IV Rocephin subsequently transitioned  Back to cefidinir-was afebrile subsequent to the same and will need to complete full course of antibiotics as previously prescribed per Dr. Margarita Grizzle. 3. Sick sinus syndrome +Nonsustained ventricular tachycardia-adjusted his metoprolol from 25 mg extended release per day to home dose of 50 mg extended release this morning.  Cardiology saw patient in consult with regards to possible sick sinus syndrome-medication adjustments were made in metoprolol was transitioned from twice a day to metoprolol extended-release 25 daily 4. Metastatic prostate cancer-patient scheduled for admission depravation therapy +/- later on this month. urology input- appreciated  5. Paroxysmal atrial fibrillation, [place] score about 2-stable at current time, not a Coumadin candidate-start aspirin 81 mg daily-metoprolol adjusted. See above #3.  6. History chronic pancreatitis-stable 7. History of bladder cancer-per urologist 8. Stage I renal insufficiency/chronic kidney disease-stable  Consultants:  [facility] has cleared him to go home with home health PT  Procedures:  Bone scan  Renal ultrasound Antibiotics:  Rocephin 8/7-8/8  Cefidinir-8/8>> finish course prescribed by urology   Paroxysmal A fibrillation last seen by Dr. Sharrell Ku 2009-not on Coumadin right now  History GI bleed 04/26/2003-diverticulosis-gastroenterologist Dr. Randa Evens  History bladder cancer 1998 Rx cystoscopy without recurrent  Right eye retinal detachment status post posterior vitrectomy 10/26/2007  Acute on chronic pancreatitis with pseudocyst and  pancreatic duct obstruction status post cholecystectomy 03/06/2011  Right hip femoral neck fracture  repaired 10/11/2011 by Dr. Allie Bossier  Admission 06/13/2012 for enterococcal UTI  18 French coud tip catheter replaced by urology 06/28/2012 tender to gross   Discharge Exam: Filed Vitals:   07/30/12 0546  BP: 113/55  Pulse: 73  Temp: 98.9 F (37.2 C)  Resp: 18   Filed Vitals:   07/29/12 1355 07/29/12 1807 07/29/12 2122 07/30/12 0546  BP: 102/60 123/77 113/61 113/55  Pulse: 66 63 75 73  Temp: 98.1 F (36.7 C)  97.3 F (36.3 C) 98.9 F (37.2 C)  TempSrc: Oral  Oral Oral  Resp: 18  18 18   Height:      Weight:      SpO2: 96%  98% 95%    General: Alert pleasant Caucasian male in no apparent distress Eyes: Scleral icterus or pallor ENT: Soft supple no bruit no JVD, poor dentition and has dentures throat clear Cardiovascular: S1-S2 no murmur rub or gallop regular rate and rhythm at present time Respiratory: Clinically clear no added sound Abdomen: Soft nontender nondistended, full catheter in place Skin: Onychogryphosis   Discharge Instructions  Discharge Orders    Future Appointments: Provider: Department: Dept Phone: Center:   08/01/2012 11:00 AM [facility] [phone] [place]   08/01/2012 2:00 PM [facility] [phone]      Future Orders Please Complete By Expires   Diet - low sodium heart healthy      Increase activity slowly      Call MD for:  persistant nausea and vomiting      Call MD for:  hives      Call MD for:  extreme fatigue      Call MD for:  persistant dizziness or light-headedness        Medication List  As of 07/30/2012  8:49 AM   STOP taking these medications         metoprolol 50 MG tablet         TAKE these medications         acetaminophen 500 MG tablet   Commonly known as: TYLENOL   Take 500-1,000 mg by mouth every 6 (six) hours as needed. For pain.      b complex vitamins tablet   Take 1 tablet by mouth daily.      cefdinir 300 MG capsule   Commonly known as: OMNICEF   Take 300  mg by mouth 2 (two) times daily. 10 day course of therapy; started 07/26/12      Ensure Plus Liqd   Take 237 mLs by mouth 2 (two) times daily between meals.      finasteride 5 MG tablet   Commonly known as: PROSCAR   Take 5 mg by mouth daily.      gemfibrozil 600 MG tablet   Commonly known as: LOPID   Take 600 mg by mouth 2 (two) times daily before a meal.      LORazepam 1 MG tablet   Commonly known as: ATIVAN   Take 0.5 mg by mouth 3 (three) times daily as needed. As needed for anxiety. Takes 1/2-1 tab      metoprolol succinate 25 MG 24 hr tablet   Commonly known as: TOPROL-XL   Take 1 tablet (25 mg total) by mouth daily.      mirtazapine 15 MG tablet   Commonly known as: REMERON   Take 15 mg by mouth at  bedtime.      saccharomyces boulardii 250 MG capsule   Commonly known as: FLORASTOR   Take 250 mg by mouth 3 (three) times daily.      TOVIAZ 4 MG Tb24   Generic drug: fesoterodine   Take 4 mg by mouth daily.           Follow-up Information    Follow up with Milford Cage, MD on 08/03/2012. (10:00 am. Nurse visit for injection of medication to block testosterone.)    Contact information:   [address] [facility] [place] [address] [phone]       Follow up with Milford Cage, MD on 08/16/2012. (12:45 for catheter removal with MD.)    Contact information:   [address] [facility] [address] [facility] [place] [address] [phone]           The results of significant diagnostics from this hospitalization (including imaging, microbiology, ancillary and laboratory) are listed below for reference.    Significant Diagnostic Studies: Dg Chest 1 View (portable)  07/27/2012  *RADIOLOGY REPORT*  Clinical Data: Fever and weakness.  Prostate cancer.  CHEST - 1 VIEW  Comparison: Two-view chest 06/28/2012.  Findings:  Emphysematous changes are again seen.  The heart size is within normal limits.  No focal airspace disease is evident.  The visualized soft tissues and bony thorax are unremarkable.  IMPRESSION:  1.  No acute cardiopulmonary disease or significant interval change. 2.  Emphysema.  Original Report Authenticated By: Jamesetta Orleans. MATTERN, M.D.   Nm Bone Scan Whole Body  07/28/2012  *RADIOLOGY REPORT*  Clinical Data: Gleason 9 score prostate cancer.  Prior rib fractures  NUCLEAR MEDICINE WHOLE BODY BONE SCINTIGRAPHY  Technique:  Whole body anterior and posterior images were obtained approximately 3 hours after intravenous injection of radiopharmaceutical.  Radiopharmaceutical: CURIE TC-MDP TECHNETIUM TC 82M MEDRONATE IV KIT  Comparison: Chest radiograph 06/13/2012, CT abdomen/pelvis 04/08/2012  Findings: Foci of abnormal radiotracer uptake are noted over multiple left sided ribs, oriented in a curvilinear fashion.  These are of varying intensity, which may suggest varying ages. Those involving the right posterior tenth and eleventh ribs are seen on the prior dissimilar exam and at least several anterolateral rib fractures were also previously visualized.  Physiologic uptake by the kidneys and within the nondilated renal collecting system is noted.  Foci of uptake over the right hand are compatible with injection site.  IMPRESSION: Foci of radiotracer uptake over the left posterolateral ribs, compatible with rib fractures of varying ages, partly visualized on prior exams.  No scintigraphic evidence for metastatic disease.  Original Report Authenticated By: Harrel Lemon, M.D.   US Renal  07/28/2012  *RADIOLOGY REPORT*  Clinical Data:  Urinary tract infection.  High risk for prostate cancer  RENAL/URINARY TRACT ULTRASOUND COMPLETE  Comparison:  February 28, 2011.  Findings:  Right Kidney:  There is mild cortical thinning.  Normal in size, measuring 10.9 cm in length.  There are multiple simple cysts, the  largest is at the mid - inferior pole, measuring 4.2 cm, unchanged.  No evidence of mass or hydronephrosis.  Left Kidney: Normal in size and echotexture, measuring 11.1 cm. Multiple simple cysts are present, the largest is at the inferior pole, measuring 1.6 cm.  No evidence of mass or hydronephrosis.  Bladder: A Day catheter is in place. A 3.5 cm bladder diverticulum is noted, similar to the  CT scan dated April 08, 2012.  Multiple simple hepatic cysts are incidentally noted, as on prior CT scan dated April 08, 2012.  IMPRESSION: There are bilateral renal cysts, unchanged.  There is mild renal cortical thinning on the right.  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  Original Report Authenticated By: Brandon Melnick, M.D.    Microbiology: Recent Results (from the past 240 hour(s))  URINE CULTURE     Status: Normal   Collection Time   07/26/12  3:35 AM      Component Value Range Status Comment   Specimen Description URINE, CATHETERIZED   Final    Special Requests NONE   Final    Culture  Setup Time 07/26/2012 10:02   Final    Colony Count >=100,000 COLONIES/ML   Final    Culture PSEUDOMONAS AERUGINOSA   Final    Report Status 07/28/2012 FINAL   Final    Organism ID, Bacteria PSEUDOMONAS AERUGINOSA   Final   URINE CULTURE     Status: Normal   Collection Time   07/27/12  5:17 PM      Component Value Range Status Comment   Specimen Description URINE, CATHETERIZED   Final    Special Requests NONE   Final    Culture  Setup Time 07/28/2012 03:18   Final    Colony Count NO GROWTH   Final    Culture NO GROWTH   Final    Report Status 07/28/2012 FINAL   Final      Labs: Basic Metabolic Panel:  Lab 07/30/12 3664 07/28/12 0405 07/27/12 1537  NA 140 137 133*  K 3.5 3.6 4.2  CL 106 104 97  CO2 24 24 23   GLUCOSE 97 99 127*  BUN 26* 26* 26*  CREATININE 0.98 1.15 1.29  CALCIUM 8.8 8.3* 8.9  MG -- -- --  PHOS -- -- --   Liver Function Tests:  Lab 07/30/12 0522 07/28/12 0405 07/27/12 1537  AST  37 48* 67*  ALT 21 20 23   ALKPHOS 66 66 81  BILITOT 0.3 0.2* 0.4  PROT 6.6 6.1 7.3  ALBUMIN 2.3* 2.3* 2.7*   No results found for this basename: LIPASE:5,AMYLASE:5 in the last 168 hours No results found for this basename: AMMONIA:5 in the last 168 hours CBC:  Lab 07/30/12 0522 07/29/12 0445 07/28/12 0405 07/27/12 1537  WBC 8.1 9.6 9.1 7.9  NEUTROABS 5.1 6.4 -- 6.7  HGB 11.1* 10.7* 10.5* 12.7*  HCT 35.0* 32.3* 32.4* 39.1  MCV 95.4 93.6 95.0 93.8  PLT 344 297 275 304   Cardiac Enzymes: No results found for this basename: CKTOTAL:5,CKMB:5,CKMBINDEX:5,TROPONINI:5 in the last 168 hours BNP: BNP (last 3 results) No results found for this basename: PROBNP:3 in the last 8760 hours CBG:  Lab 07/29/12 0743 07/28/12 0810  GLUCAP 96 135*    Time coordinating discharge:  35 minutes  Signed:  Rhetta Mura  [facility] 07/30/2012, 8:49 AM   \

## 2012-07-30 NOTE — Progress Notes (Signed)
07/30/12 1800 Brent Day [phone] Per pt choice [facility] to provide hh services upon discharge. pt to discharge home with spouse assisting in home care. Family provided pt with tx home. PCP: [name], MD. No other needs specified.

## 2012-08-01 ENCOUNTER — Encounter (HOSPITAL_COMMUNITY): Payer: Medicare Other

## 2012-08-01 ENCOUNTER — Inpatient Hospital Stay (HOSPITAL_COMMUNITY): Admission: RE | Admit: 2012-08-01 | Payer: Medicare Other | Source: Ambulatory Visit

## 2012-08-13 ENCOUNTER — Encounter (HOSPITAL_COMMUNITY)
Admission: RE | Admit: 2012-08-13 | Discharge: 2012-08-13 | Disposition: A | Discharge status: Home / Self Care | Payer: Medicare Other | Source: Ambulatory Visit | Attending: Urology | Admitting: Urology

## 2012-08-13 ENCOUNTER — Inpatient Hospital Stay (HOSPITAL_COMMUNITY)
Admission: RE | Admit: 2012-08-13 | Discharge: 2012-08-13 | Disposition: A | Discharge status: Home / Self Care | Payer: Medicare Other | Attending: Urology | Admitting: Urology

## 2012-08-13 ENCOUNTER — Encounter (HOSPITAL_COMMUNITY)
Admission: RE | Admit: 2012-08-13 | Discharge: 2012-08-13 | Payer: Medicare Other | Source: Ambulatory Visit | Attending: Urology | Admitting: Urology

## 2012-08-13 DIAGNOSIS — N390 Urinary tract infection, site not specified: Secondary | ICD-10-CM | POA: Insufficient documentation

## 2012-08-13 MED ORDER — CEFEPIME HCL 1 G IJ SOLR
500.0000 mg | Freq: Once | INTRAMUSCULAR | Status: DC
  Filled 2012-08-13: qty 1

## 2012-08-13 MED ORDER — CEFEPIME HCL 1 G IJ SOLR: 500.0000 mg | Freq: Two times a day (BID) | INTRAMUSCULAR | Status: DC

## 2012-08-13 MED ORDER — CEFEPIME HCL 1 G IJ SOLR
500.0000 mg | Freq: Two times a day (BID) | INTRAMUSCULAR | Status: DC
  Administered 2012-08-13: 500 mg via INTRAMUSCULAR
  Filled 2012-08-13 (×2): qty 1

## 2012-08-13 MED FILL — Cefepime HCl For Inj 1 GM: INTRAMUSCULAR | Qty: 1 | Status: AC

## 2012-08-14 ENCOUNTER — Encounter (HOSPITAL_COMMUNITY)
Admission: RE | Admit: 2012-08-14 | Discharge: 2012-08-14 | Disposition: A | Discharge status: Home / Self Care | Payer: Medicare Other | Source: Ambulatory Visit | Attending: Urology | Admitting: Urology

## 2012-08-14 ENCOUNTER — Emergency Department (HOSPITAL_COMMUNITY)
Admission: EM | Admit: 2012-08-14 | Discharge: 2012-08-14 | Discharge status: Absent without leave | Payer: Medicare Other | Source: Home / Self Care

## 2012-08-14 ENCOUNTER — Encounter (HOSPITAL_COMMUNITY): Admission: RE | Admit: 2012-08-14 | Payer: Medicare Other | Source: Ambulatory Visit

## 2012-08-14 MED ORDER — CEFEPIME HCL 1 G IJ SOLR
500.0000 mg | Freq: Two times a day (BID) | INTRAMUSCULAR | Status: DC
  Administered 2012-08-14: 500 mg via INTRAMUSCULAR
  Filled 2012-08-14 (×3): qty 1

## 2012-08-14 MED ORDER — CEFEPIME HCL 1 G IJ SOLR
500.0000 mg | Freq: Once | INTRAMUSCULAR | Status: DC
  Filled 2012-08-14: qty 1

## 2012-08-14 MED ORDER — CEFEPIME HCL 1 G IJ SOLR
500.0000 mg | Freq: Two times a day (BID) | INTRAMUSCULAR | Status: DC
  Administered 2012-08-14: 500 mg via INTRAMUSCULAR

## 2012-08-15 ENCOUNTER — Encounter (HOSPITAL_COMMUNITY)
Admission: RE | Admit: 2012-08-15 | Discharge: 2012-08-15 | Disposition: A | Discharge status: Home / Self Care | Payer: Medicare Other | Source: Ambulatory Visit | Attending: Urology | Admitting: Urology

## 2012-08-15 ENCOUNTER — Encounter (HOSPITAL_COMMUNITY): Payer: Self-pay

## 2012-08-15 MED ORDER — CEFEPIME HCL 1 G IJ SOLR
500.0000 mg | Freq: Two times a day (BID) | INTRAMUSCULAR | Status: DC
Start: 2012-08-15 — End: 2012-08-16
  Administered 2012-08-15: 500 mg via INTRAMUSCULAR
  Filled 2012-08-15 (×4): qty 1

## 2012-08-15 MED ORDER — CEFEPIME HCL 1 G IJ SOLR
500.0000 mg | Freq: Two times a day (BID) | INTRAMUSCULAR | Status: DC
  Administered 2012-08-15: 500 mg via INTRAMUSCULAR
  Filled 2012-08-15 (×3): qty 1

## 2012-08-16 ENCOUNTER — Encounter (HOSPITAL_COMMUNITY): Payer: Self-pay

## 2012-08-16 ENCOUNTER — Encounter (HOSPITAL_COMMUNITY)
Admission: RE | Admit: 2012-08-16 | Discharge: 2012-08-16 | Disposition: A | Discharge status: Home / Self Care | Payer: Medicare Other | Source: Ambulatory Visit | Attending: Urology | Admitting: Urology

## 2012-08-16 MED ORDER — CEFEPIME HCL 1 G IJ SOLR
500.0000 mg | Freq: Two times a day (BID) | INTRAMUSCULAR | Status: DC
  Administered 2012-08-16: 500 mg via INTRAMUSCULAR
  Filled 2012-08-16 (×2): qty 1

## 2012-08-16 MED ORDER — CEFEPIME HCL 1 G IJ SOLR
500.0000 mg | INTRAMUSCULAR | Status: DC
  Administered 2012-08-17: 500 mg via INTRAMUSCULAR
  Filled 2012-08-16 (×5): qty 1

## 2012-08-16 MED ORDER — CEFEPIME HCL 1 G IJ SOLR
500.0000 mg | Freq: Two times a day (BID) | INTRAMUSCULAR | Status: DC
  Administered 2012-08-16: 500 mg via INTRAMUSCULAR
  Filled 2012-08-16 (×3): qty 1

## 2012-08-17 ENCOUNTER — Encounter (HOSPITAL_COMMUNITY)
Admission: RE | Admit: 2012-08-17 | Discharge: 2012-08-17 | Disposition: A | Discharge status: Home / Self Care | Payer: Medicare Other | Source: Ambulatory Visit | Attending: Urology | Admitting: Urology

## 2012-08-17 ENCOUNTER — Encounter (HOSPITAL_COMMUNITY): Payer: Self-pay

## 2012-08-17 MED ORDER — CEFEPIME HCL 1 G IJ SOLR
500.0000 mg | Freq: Two times a day (BID) | INTRAMUSCULAR | Status: DC
  Filled 2012-08-17 (×3): qty 1

## 2012-08-17 MED ORDER — CEFEPIME HCL 1 G IJ SOLR
500.0000 mg | INTRAMUSCULAR | Status: DC
  Administered 2012-08-17: 500 mg via INTRAMUSCULAR
  Filled 2012-08-17 (×4): qty 1

## 2012-08-18 ENCOUNTER — Encounter (HOSPITAL_COMMUNITY)
Admission: RE | Admit: 2012-08-18 | Discharge: 2012-08-18 | Disposition: A | Discharge status: Home / Self Care | Payer: Medicare Other | Source: Ambulatory Visit | Attending: Urology | Admitting: Urology

## 2012-08-18 ENCOUNTER — Encounter (HOSPITAL_COMMUNITY): Payer: Self-pay

## 2012-08-18 MED ORDER — CEFEPIME HCL 1 G IJ SOLR
500.0000 mg | INTRAMUSCULAR | Status: DC
  Administered 2012-08-18 (×2): 500 mg via INTRAMUSCULAR
  Filled 2012-08-18 (×5): qty 1

## 2012-08-19 ENCOUNTER — Encounter (HOSPITAL_COMMUNITY)
Admission: RE | Admit: 2012-08-19 | Discharge: 2012-08-19 | Disposition: A | Discharge status: Home / Self Care | Payer: Medicare Other | Source: Ambulatory Visit | Attending: Urology | Admitting: Urology

## 2012-08-19 ENCOUNTER — Encounter (HOSPITAL_COMMUNITY): Payer: Self-pay

## 2012-08-19 MED ORDER — CEFEPIME HCL 1 G IJ SOLR
500.0000 mg | INTRAMUSCULAR | Status: DC
  Filled 2012-08-19 (×3): qty 1

## 2012-08-19 MED ORDER — CEFEPIME HCL 1 G IJ SOLR
500.0000 mg | INTRAMUSCULAR | Status: DC
  Administered 2012-08-19 (×2): 500 mg via INTRAMUSCULAR
  Filled 2012-08-19 (×4): qty 1

## 2012-09-19 ENCOUNTER — Ambulatory Visit (INDEPENDENT_AMBULATORY_CARE_PROVIDER_SITE_OTHER): Payer: Medicare Other | Admitting: Internal Medicine

## 2012-09-19 ENCOUNTER — Encounter: Payer: Self-pay | Admitting: Internal Medicine

## 2012-09-19 VITALS — BP 122/70 | HR 92 | Ht 69.0 in | Wt 142.0 lb

## 2012-09-19 DIAGNOSIS — I495 Sick sinus syndrome: Secondary | ICD-10-CM

## 2012-09-19 DIAGNOSIS — I4891 Unspecified atrial fibrillation: Secondary | ICD-10-CM

## 2012-09-19 DIAGNOSIS — Z9181 History of falling: Secondary | ICD-10-CM

## 2012-09-19 DIAGNOSIS — I10 Essential (primary) hypertension: Secondary | ICD-10-CM

## 2012-09-19 DIAGNOSIS — R296 Repeated falls: Secondary | ICD-10-CM

## 2012-09-19 MED ORDER — METOPROLOL TARTRATE 25 MG PO TABS: 25.0000 mg | ORAL_TABLET | Freq: Two times a day (BID) | ORAL | Status: DC

## 2012-09-19 NOTE — Patient Instructions (Addendum)
Your physician wants you to follow-up in: ONE YEAR WITH DR. Court Joy will receive a reminder letter in the mail two months in advance. If you don't receive a letter, please call our office to schedule the follow-up appointment.     Your physician has recommended you make the following change in your medication:  1.) STOP TOPROL XL 2.)  START METOPROLOL TARTRATE ONE TABLET ( 25 MG) TWICE A DAY.  SENT INTO [facility]

## 2012-09-19 NOTE — Assessment & Plan Note (Signed)
He has not injured himself recently. He is no longer a Coumadin or systemic anticoagulation candidate.

## 2012-09-19 NOTE — Assessment & Plan Note (Signed)
He remains asymptomatic. There is currently no indication for permanent pacemaker insertion. We'll undergo watchful waiting.

## 2012-09-19 NOTE — Assessment & Plan Note (Signed)
He appears to be maintaining sinus rhythm on beta blocker therapy. He is concerned about the cost of Toprol, and today I switched him to metoprolol twice daily.

## 2012-09-19 NOTE — Progress Notes (Signed)
HPI And a Brent Day returns today for followup. He is an 78 year old man with hypertension and atrial fibrillation. Previously he had been anticoagulated systemically but is no longer, only on aspirin daily. He appears to have had problems with falling and unsteadiness of gait. In the interim, he has developed prostate cancer. He is lost over 20 pounds. He is currently undergoing treatment. He denies chest pain, palpitations, shortness of breath, or syncope. He does note generalized reduction in his appetite. No Known Allergies   Current Outpatient Prescriptions  Medication Sig Dispense Refill  . acetaminophen (TYLENOL) 500 MG tablet Take 500-1,000 mg by mouth every 6 (six) hours as needed. For pain.      Marland Kitchen aspirin 81 MG chewable tablet Chew 81 mg by mouth daily.      Marland Kitchen b complex vitamins tablet Take 1 tablet by mouth daily.      . Ensure Plus (ENSURE PLUS) LIQD Take 237 mLs by mouth 2 (two) times daily between meals.      . finasteride (PROSCAR) 5 MG tablet Take 5 mg by mouth daily.      . Flaxseed, Linseed, OIL Take 1 tablet by mouth daily.      Marland Kitchen gemfibrozil (LOPID) 600 MG tablet Take 600 mg by mouth 2 (two) times daily before a meal.      . Leuprolide Acetate (LUPRON IJ) Inject as directed every 3 (three) months.      Marland Kitchen LORazepam (ATIVAN) 1 MG tablet Take 0.5 mg by mouth 3 (three) times daily as needed. As needed for anxiety. Takes 1/2-1 tab      . mirtazapine (REMERON) 15 MG tablet Take 15 mg by mouth at bedtime.      Marland Kitchen testosterone cypionate (DEPOTESTOTERONE CYPIONATE) 100 MG/ML injection Inject into the muscle every 14 (fourteen) days. For IM use only      . metoprolol tartrate (LOPRESSOR) 25 MG tablet Take 1 tablet (25 mg total) by mouth 2 (two) times daily.  60 tablet  11  . saccharomyces boulardii (FLORASTOR) 250 MG capsule Take 250 mg by mouth 2 (two) times daily.          Past Medical History  Diagnosis Date  . Pancreatitis   . Fractured hip 09/2011  . Prostate tumor     to have  bx by Dr. Margarita Grizzle  . UTI (lower urinary tract infection) 06/13/2012    has had x 3 months  . Falls 06/13/12    hit head approx. 1 month ago  . Unsteady gait 06/13/2012    "bad for awhile but getting worse recently"  . Slurred speech     slurred speech on 06/12/12 - didn't last long, slower talking recently  . Cancer     bladder  . Lung abnormality     spot on ct approx 3 months ago - to be followed - [facility]  . 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 Nose fracture     after a fall - Oct. 2012  . Hypertension     ROS:   All systems reviewed and negative except as noted in the HPI.   Past Surgical History  Procedure Date  . Cholecystectomy   . Partial hip arthroplasty   . Cystoscopy     has yearly due to bladder cancer  . Retinal detachment surgery     at least 3 and have been in both eyes     Family History  Problem Relation Age of Onset  .  Heart disease      No family history     History   Social History  . Marital Status: Married    Spouse Name: N/A    Number of Children: N/A  . Years of Education: N/A   Occupational History  . Not on file.   Social History Main Topics  . Smoking status: Current Some Day Smoker -- 0.5 packs/day for 62 years    Types: Cigarettes  . Smokeless tobacco: Never Used  . Alcohol Use: No  . Drug Use: No  . Sexually Active: No   Other Topics Concern  . Not on file   Social History Narrative  . No narrative on file     BP 122/70  Pulse 92  Ht 5\' 9"  (1.753 m)  Wt 142 lb (64.411 kg)  BMI 20.97 kg/m2  SpO2 95%  Physical Exam:  Well appearing NAD HEENT: Unremarkable Neck:  No JVD, no thyromegally Lymphatics:  No adenopathy Back:  No CVA tenderness Lungs:  Clear HEART:  Regular rate rhythm, no murmurs, no rubs, no clicks Abd:  soft, positive bowel sounds, no organomegally, no rebound, no guarding Ext:  2 plus pulses, no edema, no cyanosis, no clubbing Skin:  No rashes no nodules Neuro:  CN II  through XII intact, motor grossly intact  EKG Normal sinus rhythm with sinus arrhythmia.  Assess/Plan:

## 2012-09-30 ENCOUNTER — Ambulatory Visit (HOSPITAL_COMMUNITY)
Admission: RE | Admit: 2012-09-30 | Discharge: 2012-09-30 | Disposition: A | Discharge status: Home / Self Care | Payer: Medicare Other | Source: Ambulatory Visit | Attending: Urology | Admitting: Urology

## 2012-09-30 ENCOUNTER — Other Ambulatory Visit (HOSPITAL_COMMUNITY): Payer: Self-pay | Admitting: Urology

## 2012-09-30 DIAGNOSIS — C61 Malignant neoplasm of prostate: Secondary | ICD-10-CM

## 2012-10-14 ENCOUNTER — Other Ambulatory Visit: Payer: Self-pay | Admitting: Internal Medicine

## 2012-10-14 DIAGNOSIS — L97409 Non-pressure chronic ulcer of unspecified heel and midfoot with unspecified severity: Secondary | ICD-10-CM

## 2012-10-21 ENCOUNTER — Ambulatory Visit
Admission: RE | Admit: 2012-10-21 | Discharge: 2012-10-21 | Disposition: A | Discharge status: Home / Self Care | Payer: Medicare Other | Source: Ambulatory Visit | Attending: Internal Medicine | Admitting: Internal Medicine

## 2012-10-21 DIAGNOSIS — E11621 Type 2 diabetes mellitus with foot ulcer: Secondary | ICD-10-CM

## 2012-10-27 ENCOUNTER — Other Ambulatory Visit (HOSPITAL_COMMUNITY): Payer: Self-pay | Admitting: Internal Medicine

## 2012-10-27 ENCOUNTER — Ambulatory Visit (HOSPITAL_COMMUNITY)
Admission: RE | Admit: 2012-10-27 | Discharge: 2012-10-27 | Disposition: A | Discharge status: Home / Self Care | Payer: Medicare Other | Source: Ambulatory Visit | Attending: Internal Medicine | Admitting: Internal Medicine

## 2012-10-27 DIAGNOSIS — M7730 Calcaneal spur, unspecified foot: Secondary | ICD-10-CM | POA: Insufficient documentation

## 2012-10-27 DIAGNOSIS — L539 Erythematous condition, unspecified: Secondary | ICD-10-CM | POA: Insufficient documentation

## 2012-10-27 DIAGNOSIS — S93409A Sprain of unspecified ligament of unspecified ankle, initial encounter: Secondary | ICD-10-CM

## 2012-10-27 DIAGNOSIS — M25579 Pain in unspecified ankle and joints of unspecified foot: Secondary | ICD-10-CM | POA: Insufficient documentation

## 2012-11-23 ENCOUNTER — Inpatient Hospital Stay (HOSPITAL_COMMUNITY)
Admission: EM | Admit: 2012-11-23 | Discharge: 2012-11-29 | DRG: 699 | Disposition: A | Discharge status: Skilled Nursing Facility | Payer: Medicare Other | Attending: Internal Medicine | Admitting: Internal Medicine

## 2012-11-23 ENCOUNTER — Encounter (HOSPITAL_COMMUNITY): Payer: Self-pay | Admitting: Emergency Medicine

## 2012-11-23 ENCOUNTER — Emergency Department (HOSPITAL_COMMUNITY): Payer: Medicare Other

## 2012-11-23 DIAGNOSIS — Z9181 History of falling: Secondary | ICD-10-CM

## 2012-11-23 DIAGNOSIS — E871 Hypo-osmolality and hyponatremia: Secondary | ICD-10-CM | POA: Diagnosis present

## 2012-11-23 DIAGNOSIS — I10 Essential (primary) hypertension: Secondary | ICD-10-CM

## 2012-11-23 DIAGNOSIS — E860 Dehydration: Secondary | ICD-10-CM

## 2012-11-23 DIAGNOSIS — R296 Repeated falls: Secondary | ICD-10-CM

## 2012-11-23 DIAGNOSIS — R5381 Other malaise: Secondary | ICD-10-CM | POA: Diagnosis present

## 2012-11-23 DIAGNOSIS — Y846 Urinary catheterization as the cause of abnormal reaction of the patient, or of later complication, without mention of misadventure at the time of the procedure: Secondary | ICD-10-CM | POA: Diagnosis present

## 2012-11-23 DIAGNOSIS — R531 Weakness: Secondary | ICD-10-CM

## 2012-11-23 DIAGNOSIS — B370 Candidal stomatitis: Secondary | ICD-10-CM | POA: Diagnosis present

## 2012-11-23 DIAGNOSIS — Z66 Do not resuscitate: Secondary | ICD-10-CM | POA: Diagnosis present

## 2012-11-23 DIAGNOSIS — E872 Acidosis: Secondary | ICD-10-CM

## 2012-11-23 DIAGNOSIS — R509 Fever, unspecified: Secondary | ICD-10-CM

## 2012-11-23 DIAGNOSIS — R911 Solitary pulmonary nodule: Secondary | ICD-10-CM

## 2012-11-23 DIAGNOSIS — Z8546 Personal history of malignant neoplasm of prostate: Secondary | ICD-10-CM

## 2012-11-23 DIAGNOSIS — T83511A Infection and inflammatory reaction due to indwelling urethral catheter, initial encounter: Principal | ICD-10-CM | POA: Diagnosis present

## 2012-11-23 DIAGNOSIS — R627 Adult failure to thrive: Secondary | ICD-10-CM

## 2012-11-23 DIAGNOSIS — C801 Malignant (primary) neoplasm, unspecified: Secondary | ICD-10-CM

## 2012-11-23 DIAGNOSIS — N390 Urinary tract infection, site not specified: Secondary | ICD-10-CM | POA: Diagnosis present

## 2012-11-23 DIAGNOSIS — N179 Acute kidney failure, unspecified: Secondary | ICD-10-CM | POA: Diagnosis present

## 2012-11-23 DIAGNOSIS — R110 Nausea: Secondary | ICD-10-CM

## 2012-11-23 DIAGNOSIS — I495 Sick sinus syndrome: Secondary | ICD-10-CM

## 2012-11-23 DIAGNOSIS — R197 Diarrhea, unspecified: Secondary | ICD-10-CM | POA: Diagnosis not present

## 2012-11-23 DIAGNOSIS — I4891 Unspecified atrial fibrillation: Secondary | ICD-10-CM | POA: Diagnosis present

## 2012-11-23 DIAGNOSIS — Z8551 Personal history of malignant neoplasm of bladder: Secondary | ICD-10-CM

## 2012-11-23 DIAGNOSIS — B965 Pseudomonas (aeruginosa) (mallei) (pseudomallei) as the cause of diseases classified elsewhere: Secondary | ICD-10-CM | POA: Diagnosis present

## 2012-11-23 MED ORDER — ACETAMINOPHEN 325 MG PO TABS
650.0000 mg | ORAL_TABLET | Freq: Once | ORAL | Status: AC
  Administered 2012-11-24: 650 mg via ORAL
  Filled 2012-11-23: qty 2

## 2012-11-23 MED ORDER — SODIUM CHLORIDE 0.9 % IV SOLN
1000.0000 mL | Freq: Once | INTRAVENOUS | Status: AC
  Administered 2012-11-24: 1000 mL via INTRAVENOUS

## 2012-11-23 MED ORDER — SODIUM CHLORIDE 0.9 % IV SOLN
1000.0000 mL | INTRAVENOUS | Status: DC
  Administered 2012-11-24: 1000 mL via INTRAVENOUS

## 2012-11-23 NOTE — ED Notes (Signed)
PER EMS- pt picked up from home, pt doesn't want to be evaluated, however family insist due to generalized weakness, shuffling when he walks and family states this is not normal for pt, and 1 episode of projectile vomiting.  Pt has hx of prostate cancer and foley catheter.  Family reports that foley catheter urine has been cloudier that normal.

## 2012-11-23 NOTE — ED Provider Notes (Signed)
History     CSN: [id number]  Arrival date & time 11/23/12  2112   First MD Initiated Contact with Patient 11/23/12 2332      Chief Complaint  Patient presents with  . Weakness    HPI Pt has been having trouble with progressive weakness and drowsiness for the last few days.  He has not wanted to come in but finally agreed tonight.  He has been feeling shaky and weak.  He has not been able to walk well.  The weakness is generalized and not focal.  Usually is able to walk well but the last couple of days he has only been able to take shuffling steps.  He has been having trouble with urinary retention.  He has a foley catheter in place since June.  Family has noticed that the urine is cloudier than usual.  Decreased urine output. He has had vomiting tonight as well.  No diarrhea.     Past Medical History  Diagnosis Date  . Pancreatitis   . Fractured hip 09/2011  . Prostate tumor     to have bx by Dr. Margarita Grizzle  . UTI (lower urinary tract infection) 06/13/2012    has had x 3 months  . Falls 06/13/12    hit head approx. 1 month ago  . Unsteady gait 06/13/2012    "bad for awhile but getting worse recently"  . Slurred speech     slurred speech on 06/12/12 - didn't last long, slower talking recently  . Cancer     bladder  . Lung abnormality     spot on ct approx 3 months ago - to be followed - [facility]  . 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 Nose fracture     after a fall - Oct. 2012  . Hypertension     Past Surgical History  Procedure Date  . Cholecystectomy   . Partial hip arthroplasty   . Cystoscopy     has yearly due to bladder cancer  . Retinal detachment surgery     at least 3 and have been in both eyes    Family History  Problem Relation Age of Onset  . Heart disease      No family history    History  Substance Use Topics  . Smoking status: Current Some Day Smoker -- 0.5 packs/day for 62 years    Types: Cigarettes  . Smokeless tobacco:  Never Used  . Alcohol Use: No      Review of Systems  Constitutional: Positive for fever.  Respiratory: Positive for cough.   Skin: Negative for rash.  All other systems reviewed and are negative.    Allergies  Review of patient's allergies indicates no known allergies.  Home Medications   Current Outpatient Rx  Name  Route  Sig  Dispense  Refill  . ACETAMINOPHEN ER 650 MG PO TBCR   Oral   Take 650 mg by mouth every 8 (eight) hours as needed. For fever         . B COMPLEX PO TABS   Oral   Take 1 tablet by mouth daily.         Marland Kitchen VITAMIN D3 2000 UNITS PO CAPS   Oral   Take 2,000 Units by mouth daily.         Marland Kitchen ENSURE PLUS PO LIQD   Oral   Take 237 mLs by mouth 2 (two) times daily between meals.         Marland Kitchen  FINASTERIDE 5 MG PO TABS   Oral   Take 5 mg by mouth daily.         Marland Kitchen FLAXSEED (LINSEED) PO OIL   Oral   Take 2 tablets by mouth daily.          Marland Kitchen GEMFIBROZIL 600 MG PO TABS   Oral   Take 600 mg by mouth 2 (two) times daily before a meal.         . LORAZEPAM 1 MG PO TABS   Oral   Take 0.5-1 mg by mouth 3 (three) times daily as needed. As needed for anxiety  Or sleep         . METOPROLOL TARTRATE 50 MG PO TABS   Oral   Take 25 mg by mouth 2 (two) times daily.         Marland Kitchen MIRTAZAPINE 30 MG PO TABS   Oral   Take 30 mg by mouth at bedtime.         Marland Kitchen SACCHAROMYCES BOULARDII 250 MG PO CAPS   Oral   Take 250 mg by mouth 2 (two) times daily.          Marland Kitchen LUPRON IJ   Injection   Inject 1 applicator as directed every 3 (three) months.            BP 107/71  Pulse 85  Temp 103 F (39.4 C) (Rectal)  Resp 20  SpO2 98%  Physical Exam  Nursing note and vitals reviewed. Constitutional: No distress.       Fail , elderly   HENT:  Head: Normocephalic and atraumatic.  Right Ear: External ear normal.  Left Ear: External ear normal.  Mouth/Throat: Mucous membranes are dry. No oropharyngeal exudate.  Eyes: Conjunctivae normal are  normal. Right eye exhibits no discharge. Left eye exhibits no discharge. No scleral icterus.  Neck: Neck supple. No tracheal deviation present.  Cardiovascular: Normal rate, regular rhythm and intact distal pulses.   Pulmonary/Chest: Effort normal and breath sounds normal. No stridor. No respiratory distress. He has no wheezes. He has no rales.  Abdominal: Soft. Bowel sounds are normal. He exhibits no distension. There is no tenderness. There is no rebound and no guarding.  Musculoskeletal: He exhibits no edema and no tenderness.  Neurological: He is alert. No sensory deficit. Cranial nerve deficit:  no gross defecits noted. He exhibits normal muscle tone. He displays no seizure activity. Coordination normal.       Decreased strength throughout  Skin: Skin is warm and dry. No rash noted.  Psychiatric: He has a normal mood and affect.    ED Course  Procedures (including critical care time)   Labs Reviewed  URINALYSIS, ROUTINE W REFLEX MICROSCOPIC - Abnormal; Notable for the following:    APPearance TURBID (*)     Hgb urine dipstick LARGE (*)     Protein, ur 30 (*)     Nitrite POSITIVE (*)     Leukocytes, UA LARGE (*)     All other components within normal limits  COMPREHENSIVE METABOLIC PANEL - Abnormal; Notable for the following:    Sodium 130 (*)     CO2 17 (*)     Glucose, Bld 113 (*)     BUN 44 (*)     Creatinine, Ser 1.55 (*)     Albumin 2.4 (*)     GFR calc non Af Amer 40 (*)     GFR calc Af Amer 46 (*)     All other  components within normal limits  CBC WITH DIFFERENTIAL - Abnormal; Notable for the following:    WBC 10.6 (*)     RBC 4.09 (*)     Hemoglobin 12.5 (*)     HCT 36.3 (*)     All other components within normal limits  URINE MICROSCOPIC-ADD ON - Abnormal; Notable for the following:    Bacteria, UA FEW (*)     All other components within normal limits  PROTIME-INR  LACTIC ACID, PLASMA  CULTURE, BLOOD (ROUTINE X 2)  CULTURE, BLOOD (ROUTINE X 2)  URINE  CULTURE  URINE CULTURE   Dg Chest 1 View  11/24/2012  *RADIOLOGY REPORT*  Clinical Data: Generalized weakness; history of hypertension.  CHEST - 1 VIEW  Comparison: Chest radiograph performed 09/30/2012  Findings: The lungs are well-aerated.  Chronically increased interstitial markings are seen; mild right midlung opacity noted may simply reflect overlying osseous structures, though minimal pneumonia cannot be excluded.  There is no evidence of focal opacification, pleural effusion or pneumothorax.  The cardiomediastinal silhouette is within normal limits.  No acute osseous abnormalities are seen.  Prominent costochondral calcification is again noted bilaterally at the first ribs.  IMPRESSION: Mild right midlung opacity may simply reflect overlying osseous structures, though minimal pneumonia cannot be excluded.  Chronic lung changes noted.   Original Report Authenticated By: Tonia Ghent, M.D.      1. UTI (lower urinary tract infection)   2. Fever   3. Metabolic acidosis       MDM  Fever/UTI Pt will be started on IV abx.   Initial vital normal with reassuring lactic acid.  Will monitor for signs of sepsis.  Metabolic acidosis Continue IV hydration. Plan on admission for further treatment.      Celene Kras, MD 11/24/12 [phone]

## 2012-11-23 NOTE — ED Notes (Signed)
ZOX:[id number]<AV> Expected date:11/23/12<BR> Expected time: 9:05 PM<BR> Means of arrival:Ambulance<BR> Comments:<BR> Prostate cancer, vomiting, weakness

## 2012-11-24 ENCOUNTER — Inpatient Hospital Stay (HOSPITAL_COMMUNITY): Payer: Medicare Other

## 2012-11-24 ENCOUNTER — Encounter (HOSPITAL_COMMUNITY): Payer: Self-pay | Admitting: Internal Medicine

## 2012-11-24 DIAGNOSIS — I4891 Unspecified atrial fibrillation: Secondary | ICD-10-CM

## 2012-11-24 DIAGNOSIS — R911 Solitary pulmonary nodule: Secondary | ICD-10-CM | POA: Diagnosis present

## 2012-11-24 DIAGNOSIS — R509 Fever, unspecified: Secondary | ICD-10-CM

## 2012-11-24 DIAGNOSIS — N390 Urinary tract infection, site not specified: Secondary | ICD-10-CM

## 2012-11-24 DIAGNOSIS — E860 Dehydration: Secondary | ICD-10-CM

## 2012-11-24 LAB — COMPREHENSIVE METABOLIC PANEL
ALT: 11 U/L (ref 0–53)
AST: 21 U/L (ref 0–37)
Albumin: 2.1 g/dL — ABNORMAL LOW (ref 3.5–5.2)
Alkaline Phosphatase: 81 U/L (ref 39–117)
BUN: 41 mg/dL — ABNORMAL HIGH (ref 6–23)
Creatinine, Ser: 1.55 mg/dL — ABNORMAL HIGH (ref 0.50–1.35)
GFR calc Af Amer: 46 mL/min — ABNORMAL LOW (ref >90)
GFR calc Af Amer: 46 mL/min — ABNORMAL LOW (ref >90)
Glucose, Bld: 113 mg/dL — ABNORMAL HIGH (ref 70–99)
Glucose, Bld: 115 mg/dL — ABNORMAL HIGH (ref 70–99)
Potassium: 3.6 mEq/L (ref 3.5–5.1)
Sodium: 130 mEq/L — ABNORMAL LOW (ref 135–145)
Total Bilirubin: 0.2 mg/dL — ABNORMAL LOW (ref 0.3–1.2)
Total Protein: 5.9 g/dL — ABNORMAL LOW (ref 6.0–8.3)
Total Protein: 6.9 g/dL (ref 6.0–8.3)

## 2012-11-24 LAB — CBC WITH DIFFERENTIAL/PLATELET
Basophils Absolute: 0 10*3/uL (ref 0.0–0.1)
Eosinophils Absolute: 0.1 10*3/uL (ref 0.0–0.7)
Eosinophils Relative: 1 % (ref 0–5)
Eosinophils Relative: 1 % (ref 0–5)
HCT: 30.1 % — ABNORMAL LOW (ref 39.0–52.0)
HCT: 36.3 % — ABNORMAL LOW (ref 39.0–52.0)
Hemoglobin: 12.5 g/dL — ABNORMAL LOW (ref 13.0–17.0)
Lymphocytes Relative: 18 % (ref 12–46)
Lymphocytes Relative: 21 % (ref 12–46)
Lymphs Abs: 1.9 10*3/uL (ref 0.7–4.0)
Lymphs Abs: 1.9 10*3/uL (ref 0.7–4.0)
MCH: 30.6 pg (ref 26.0–34.0)
MCV: 88.8 fL (ref 78.0–100.0)
MCV: 89.3 fL (ref 78.0–100.0)
Monocytes Absolute: 0.9 10*3/uL (ref 0.1–1.0)
Monocytes Relative: 8 % (ref 3–12)
Neutro Abs: 6.4 10*3/uL (ref 1.7–7.7)
Platelets: 270 10*3/uL (ref 150–400)
Platelets: 313 10*3/uL (ref 150–400)
RBC: 3.37 MIL/uL — ABNORMAL LOW (ref 4.22–5.81)
RBC: 4.09 MIL/uL — ABNORMAL LOW (ref 4.22–5.81)
WBC: 9.1 10*3/uL (ref 4.0–10.5)

## 2012-11-24 LAB — URINALYSIS, ROUTINE W REFLEX MICROSCOPIC
Bilirubin Urine: NEGATIVE
Ketones, ur: NEGATIVE mg/dL
Nitrite: POSITIVE — AB
Specific Gravity, Urine: 1.013 (ref 1.005–1.030)
Urobilinogen, UA: 0.2 mg/dL (ref 0.0–1.0)
pH: 6 (ref 5.0–8.0)

## 2012-11-24 LAB — INFLUENZA PANEL BY PCR (TYPE A & B)
Influenza A By PCR: NEGATIVE
Influenza B By PCR: NEGATIVE

## 2012-11-24 LAB — TSH: TSH: 0.845 u[IU]/mL (ref 0.350–4.500)

## 2012-11-24 MED ORDER — SODIUM CHLORIDE 0.9 % IV SOLN
250.0000 mg | Freq: Three times a day (TID) | INTRAVENOUS | Status: DC
  Administered 2012-11-24 – 2012-11-25 (×3): 250 mg via INTRAVENOUS
  Filled 2012-11-24 (×4): qty 250

## 2012-11-24 MED ORDER — ENSURE COMPLETE PO LIQD
237.0000 mL | Freq: Two times a day (BID) | ORAL | Status: DC
  Administered 2012-11-24 – 2012-11-29 (×10): 237 mL via ORAL

## 2012-11-24 MED ORDER — FINASTERIDE 5 MG PO TABS
5.0000 mg | ORAL_TABLET | Freq: Every day | ORAL | Status: DC
  Administered 2012-11-24 – 2012-11-29 (×6): 5 mg via ORAL
  Filled 2012-11-24 (×6): qty 1

## 2012-11-24 MED ORDER — LORAZEPAM 0.5 MG PO TABS
0.5000 mg | ORAL_TABLET | Freq: Three times a day (TID) | ORAL | Status: DC | PRN
  Administered 2012-11-24 – 2012-11-28 (×3): 1 mg via ORAL
  Filled 2012-11-24 (×3): qty 2

## 2012-11-24 MED ORDER — SODIUM CHLORIDE 0.9 % IJ SOLN: 3.0000 mL | Freq: Two times a day (BID) | INTRAMUSCULAR | Status: DC

## 2012-11-24 MED ORDER — ONDANSETRON HCL 4 MG/2ML IJ SOLN
4.0000 mg | Freq: Four times a day (QID) | INTRAMUSCULAR | Status: DC | PRN
  Administered 2012-11-25: 4 mg via INTRAVENOUS
  Filled 2012-11-24: qty 2

## 2012-11-24 MED ORDER — DEXTROSE 5 % IV SOLN
1.0000 g | Freq: Every day | INTRAVENOUS | Status: DC
  Filled 2012-11-24: qty 10

## 2012-11-24 MED ORDER — DEXTROSE 5 % IV SOLN
1.0000 g | INTRAVENOUS | Status: DC
  Administered 2012-11-24: 1 g via INTRAVENOUS
  Filled 2012-11-24: qty 10

## 2012-11-24 MED ORDER — METOPROLOL TARTRATE 25 MG PO TABS
25.0000 mg | ORAL_TABLET | Freq: Two times a day (BID) | ORAL | Status: DC
  Administered 2012-11-24 – 2012-11-29 (×10): 25 mg via ORAL
  Filled 2012-11-24 (×12): qty 1

## 2012-11-24 MED ORDER — DEXTROSE 5 % IV SOLN
500.0000 mg | Freq: Every day | INTRAVENOUS | Status: DC
  Administered 2012-11-24: 500 mg via INTRAVENOUS
  Filled 2012-11-24: qty 500

## 2012-11-24 MED ORDER — SODIUM CHLORIDE 0.9 % IV SOLN
INTRAVENOUS | Status: AC
  Administered 2012-11-24: 125 mL/h via INTRAVENOUS

## 2012-11-24 MED ORDER — ACETAMINOPHEN 650 MG RE SUPP: 650.0000 mg | Freq: Four times a day (QID) | RECTAL | Status: DC | PRN

## 2012-11-24 MED ORDER — SACCHAROMYCES BOULARDII 250 MG PO CAPS
250.0000 mg | ORAL_CAPSULE | Freq: Two times a day (BID) | ORAL | Status: DC
  Administered 2012-11-24 – 2012-11-29 (×11): 250 mg via ORAL
  Filled 2012-11-24 (×12): qty 1

## 2012-11-24 MED ORDER — FLUCONAZOLE 50 MG PO TABS
50.0000 mg | ORAL_TABLET | Freq: Every day | ORAL | Status: DC
  Administered 2012-11-24 – 2012-11-29 (×6): 50 mg via ORAL
  Filled 2012-11-24 (×6): qty 1

## 2012-11-24 MED ORDER — DEXTROSE 5 % IV SOLN: 500.0000 mg | INTRAVENOUS | Status: DC

## 2012-11-24 MED ORDER — GEMFIBROZIL 600 MG PO TABS
600.0000 mg | ORAL_TABLET | Freq: Two times a day (BID) | ORAL | Status: DC
  Administered 2012-11-24 – 2012-11-29 (×11): 600 mg via ORAL
  Filled 2012-11-24 (×14): qty 1

## 2012-11-24 MED ORDER — ONDANSETRON HCL 4 MG PO TABS: 4.0000 mg | ORAL_TABLET | Freq: Four times a day (QID) | ORAL | Status: DC | PRN

## 2012-11-24 MED ORDER — ACETAMINOPHEN 325 MG PO TABS
650.0000 mg | ORAL_TABLET | Freq: Four times a day (QID) | ORAL | Status: DC | PRN
  Administered 2012-11-24 – 2012-11-25 (×3): 650 mg via ORAL
  Filled 2012-11-24 (×3): qty 2

## 2012-11-24 MED ORDER — POTASSIUM CHLORIDE CRYS ER 20 MEQ PO TBCR
40.0000 meq | EXTENDED_RELEASE_TABLET | Freq: Once | ORAL | Status: AC
  Administered 2012-11-24: 40 meq via ORAL
  Filled 2012-11-24: qty 2

## 2012-11-24 MED ORDER — MIRTAZAPINE 30 MG PO TABS
30.0000 mg | ORAL_TABLET | Freq: Every day | ORAL | Status: DC
  Administered 2012-11-24 – 2012-11-28 (×5): 30 mg via ORAL
  Filled 2012-11-24 (×6): qty 1

## 2012-11-24 NOTE — Progress Notes (Signed)
[facility] PROGRESS NOTE  Brent Day. ZOX:[id number] DOB: 1927/09/25 DOA: 11/23/2012 PCP: [name], MD  Assessment/Plan: Principal Problem:  *Weakness Active Problems:  Atrial fibrillation  Dehydration  UTI (lower urinary tract infection)    1. Weakness: This is generalized, and multi-factorial, secondary to dehydration and infective issues, as well as poor oral intake. Managing as outlined below. For PT/OT evaluation.  2. Fever/UTI: Patient presented with a pyrexia of 103 degrees Farenheit, and was found to have a positive urinary sediment, with significant pyuria, in the setting of a chronic in-dwelling Day catheter. Now on iv Rocephin day#2. He did spike again overnight. As this is a complicated UTI, will broaden coverage with Primaxin, and await urine and blood cultures.   3. ARF/Hyponatremia: Creatinine at presentation, was 1.55, BUN 44, consistent with dehydration and volume depletion/AKI, against a background of poor oral intake. Baseline creatinine was 0.98 on 07/2012. He had a mild hyponatremia of 130, which has already improved with iv fluids. Continue gentle iv fluids.  4. Query pneumonia/Pulmonary nodule: CXR suggested a possible right lung pneumonia, but this was not substantiated on chest CT scan, which demonstrated a 3 mm nodule on the right minor fissure . Patient has been reassured accordingly. Follow up CT scan at 1 year, is recommended. Azithromycin has been discontinued.  5. Atrial fibrillation: Patient has a known history of atrial fibrillation. Currently rate-controlled. Patient was felt not to be on a candidate for Coumadin, due to high fall risk. .  6. History of prostate cancer/bladder cancer: Stable. Follow up with urologist, as outpatient.  7. Oral thrush: This was an incidental finding on physical examination. Placed on a 7-day course of Diflucan.    Code Status: DNR/DNI Family Communication:  Disposition Plan: to be determined.    Brief narrative: 78 year-old male with history of HTN, prostate cancer, chronic indwelling Day catheter, atrial fibrillation felt not a candidate for Coumadin secondary to falls, was brought to the ED for weakness over the last 4-5 days, nasal congestion for a few days and "panting" spells. He did have one episode of vomiting and appetite has been poor. In the ED patient was found to be febrile with temperatures 103F. UA was compatible with UTI. Chest x-ray showed opacity in the right side which may be a pneumonic process. Creatinine was increased from baseline and there was a mild metabolic acidosis. He was admitted for further management.    Consultants:  N/A.   Procedures:  CXR.  Chest CT scan.  Right ankle X-Ray.   Antibiotics:  Rocephin 11/23/12>>>  Azithromycin 11/23/12>>>  HPI/Subjective: Feels better.   Objective: Vital signs in last 24 hours: Temp:  [98 F (36.7 C)-103 F (39.4 C)] 102.5 F (39.2 C) (12/05 1303) Pulse Rate:  [67-98] 67  (12/05 0403) Resp:  [18-20] 18  (12/05 0403) BP: (100-110)/(46-71) 100/46 mmHg (12/05 0403) SpO2:  [96 %-98 %] 97 % (12/05 0403) Weight:  [61.5 kg (135 lb 9.3 oz)] 61.5 kg (135 lb 9.3 oz) (12/05 0403) Weight change:  Last BM Date: 11/23/12  Intake/Output from previous day: 12/04 0701 - 12/05 0700 In: 609.6 [P.O.:120; I.V.:239.6; IV Piggyback:250] Out: 600 [Urine:600] Total I/O In: 120 [P.O.:120] Out: 450 [Urine:450]   Physical Exam: General: Comfortable, alert, communicative, fully oriented, not short of breath at rest.  HEENT: Mild clinical pallor, no jaundice, no conjunctival injection or discharge. Buccal mucosa is "dry:" Has oral thrush.  NECK:  Supple, JVP not seen, no carotid bruits, no palpable lymphadenopathy, no palpable  goiter. CHEST:  Clinically clear to auscultation, no wheezes, no crackles. HEART:  Sounds 1 and 2 heard, normal, irregular, no murmurs. ABDOMEN:  Full, soft, non-tender, no palpable  organomegaly, no palpable masses, normal bowel sounds. GENITALIA:  Not examined. LOWER EXTREMITIES:  No pitting edema, palpable peripheral pulses. MUSCULOSKELETAL SYSTEM:  Generalized osteoarthritic changes, otherwise, normal. CENTRAL NERVOUS SYSTEM:  No focal neurologic deficit on gross examination.  Lab Results:  Basename 11/24/12 0545 11/24/12 0030  WBC 9.1 10.6*  HGB 10.3* 12.5*  HCT 30.1* 36.3*  PLT 270 313    Basename 11/24/12 0545 11/24/12 0030  NA 135 130*  K 3.2* 3.6  CL 105 98  CO2 19 17*  GLUCOSE 115* 113*  BUN 41* 44*  CREATININE 1.55* 1.55*  CALCIUM 8.1* 8.8   No results found for this or any previous visit (from the past 240 hour(s)).   Studies/Results: Dg Chest 1 View  11/24/2012  *RADIOLOGY REPORT*  Clinical Data: Generalized weakness; history of hypertension.  CHEST - 1 VIEW  Comparison: Chest radiograph performed 09/30/2012  Findings: The lungs are well-aerated.  Chronically increased interstitial markings are seen; mild right midlung opacity noted may simply reflect overlying osseous structures, though minimal pneumonia cannot be excluded.  There is no evidence of focal opacification, pleural effusion or pneumothorax.  The cardiomediastinal silhouette is within normal limits.  No acute osseous abnormalities are seen.  Prominent costochondral calcification is again noted bilaterally at the first ribs.  IMPRESSION: Mild right midlung opacity may simply reflect overlying osseous structures, though minimal pneumonia cannot be excluded.  Chronic lung changes noted.   Original Report Authenticated By: Tonia Ghent, M.D.    Dg Ankle 2 Views Right  11/24/2012  *RADIOLOGY REPORT*  Clinical Data: Right ankle pain for 5 months, no recent injury  RIGHT ANKLE - 2 VIEW  Comparison: None  Findings: Osseous mineralization. Ankle mortise intact. Bulky plantar calcaneal spur. Obliquity on lateral view. No acute fracture, dislocation, or bone destruction.  IMPRESSION: Plantar  calcaneal spur. Osseous demineralization.   Original Report Authenticated By: Ulyses Southward, M.D.    Ct Chest Wo Contrast  11/24/2012  *RADIOLOGY REPORT*  Clinical Data: Abnormal chest x-ray.  Question right lung pneumonia/opacity.  History prostate and bladder cancer.  Fever and short of breath.  CT CHEST WITHOUT CONTRAST  Technique:  Multidetector CT imaging of the chest was performed following the standard protocol without IV contrast.  Comparison: Plain film of 11/24/2012.  No prior chest CT.  Findings: Lungs/pleura: Moderate centrilobular emphysema.  Biapical mild pleural parenchymal scarring. 3 mm nodule on the right minor fissure may represent a subpleural lymph node on image 35/series 5. Minimal motion degradation inferiorly. No evidence of pneumonia.  Minimal dependent atelectasis in the posterior right upper lobe and right greater than left lung bases.  No pleural fluid.  Heart/Mediastinum: Tortuous descending thoracic aorta. Normal heart size, without pericardial effusion.  Multivessel coronary artery atherosclerosis.  No mediastinal or hilar adenopathy.  Fluid level within the thoracic esophagus on image 27/series 2.  Mildly dilated more cephalad esophagus.  Upper abdomen: Hepatic cysts, the largest of which is in the posterior right lobe and measures 4.3 cm.  Cholecystectomy. Probable cyst in the upper pole right kidney.  Mild left adrenal thickening, similar to 04/08/2012.  Pancreatic atrophy and duct dilatation within the tail, as detailed on 04/08/2012 dedicated CT.  Bones/Musculoskeletal:  Mild osteopenia with remote bilateral rib fractures.  Mild wedging of the L1 vertebral body, similar 04/08/2012.  IMPRESSION:  1.  No evidence of right-sided pneumonia or mass. 2.  Centrilobular emphysema with a 3 mm nodule which is likely a subpleural lymph node in the right middle lobe. Given risk factors for bronchogenic carcinoma, follow-up chest CT at 1 year is recommended.  This recommendation follows the  consensus statement: "Guidelines for Management of Small Pulmonary Nodules Detected on CT Scans:  A Statement from the Fleischner Society" as published in Radiology 2005; 237:395-400.  Available online at: DietDisorder.cz.  3.  Findings suggesting esophageal dysmotility or gastroesophageal reflux.   Original Report Authenticated By: Jeronimo Greaves, M.D.     Medications: Scheduled Meds:   . [COMPLETED] sodium chloride  1,000 mL Intravenous Once  . [COMPLETED] acetaminophen  650 mg Oral Once  . azithromycin  500 mg Intravenous Q0600  . cefTRIAXone (ROCEPHIN)  IV  1 g Intravenous QHS  . feeding supplement  237 mL Oral BID BM  . finasteride  5 mg Oral Daily  . gemfibrozil  600 mg Oral BID AC  . metoprolol  25 mg Oral BID  . mirtazapine  30 mg Oral QHS  . saccharomyces boulardii  250 mg Oral BID  . sodium chloride  3 mL Intravenous Q12H  . [DISCONTINUED] azithromycin  500 mg Intravenous Q24H  . [DISCONTINUED] cefTRIAXone (ROCEPHIN)  IV  1 g Intravenous Q24H   Continuous Infusions:   . sodium chloride 125 mL/hr (11/24/12 1241)  . [DISCONTINUED] sodium chloride 1,000 mL (11/24/12 0137)   PRN Meds:.acetaminophen, acetaminophen, LORazepam, ondansetron (ZOFRAN) IV, ondansetron    LOS: 1 day   Loukisha Gunnerson,CHRISTOPHER  [facility] Pager [phone]. If 8PM-8AM, please contact night-coverage at [email or web address], password [facility] 11/24/2012, 1:05 PM  LOS: 1 day

## 2012-11-24 NOTE — H&P (Signed)
Brent Day. is an 78 y.o. male.   Patient was seen and examined on December 2013. PCP - Dr. Lucky Cowboy. Chief Complaint: Weakness. HPI: 78 year-old male with history of prostate cancer with chronic indwelling Day catheter, atrial fibrillation felt not a candidate for Coumadin secondary to falls, was brought to the ER after patient was found to be week for the last 4-5 days. Patient's son also noticed that patient at times get panting spells. He has been having some nasal congestion for a few days. He did have one episode of nausea vomiting history but denies abdominal pain or diarrhea. He has been having poor appetite. In the ER patient was found to be febrile with temperatures 103F. UA was compatible with UTI. Chest x-ray shows opacity in the right side which may be a pneumonic process. Patient has been admitted for further workup. In addition patient was found to have increased creatinine from baseline and also has mild metabolic acidosis.  Past Medical History  Diagnosis Date  . Pancreatitis   . Fractured hip 09/2011  . Prostate tumor     to have bx by Dr. Margarita Grizzle  . UTI (lower urinary tract infection) 06/13/2012    has had x 3 months  . Falls 06/13/12    hit head approx. 1 month ago  . Unsteady gait 06/13/2012    "bad for awhile but getting worse recently"  . Slurred speech     slurred speech on 06/12/12 - didn't last long, slower talking recently  . Cancer     bladder  . Lung abnormality     spot on ct approx 3 months ago - to be followed - [facility]  . 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 Nose fracture     after a fall - Oct. 2012  . Hypertension     Past Surgical History  Procedure Date  . Cholecystectomy   . Partial hip arthroplasty   . Cystoscopy     has yearly due to bladder cancer  . Retinal detachment surgery     at least 3 and have been in both eyes    Family History  Problem Relation Age of Onset  . Heart disease      No  family history   Social History:  reports that he has been smoking Cigarettes.  He has a 31 pack-year smoking history. He has never used smokeless tobacco. He reports that he does not drink alcohol or use illicit drugs.  Allergies: No Known Allergies  Medications Prior to Admission  Medication Sig Dispense Refill  . acetaminophen (TYLENOL) 650 MG CR tablet Take 650 mg by mouth every 8 (eight) hours as needed. For fever      . b complex vitamins tablet Take 1 tablet by mouth daily.      . Cholecalciferol (VITAMIN D3) 2000 UNITS capsule Take 2,000 Units by mouth daily.      . Ensure Plus (ENSURE PLUS) LIQD Take 237 mLs by mouth 2 (two) times daily between meals.      . finasteride (PROSCAR) 5 MG tablet Take 5 mg by mouth daily.      . Flaxseed, Linseed, OIL Take 2 tablets by mouth daily.       Marland Kitchen gemfibrozil (LOPID) 600 MG tablet Take 600 mg by mouth 2 (two) times daily before a meal.      . LORazepam (ATIVAN) 1 MG tablet Take 0.5-1 mg by mouth 3 (three) times  daily as needed. As needed for anxiety  Or sleep      . metoprolol (LOPRESSOR) 50 MG tablet Take 25 mg by mouth 2 (two) times daily.      . mirtazapine (REMERON) 30 MG tablet Take 30 mg by mouth at bedtime.      . saccharomyces boulardii (FLORASTOR) 250 MG capsule Take 250 mg by mouth 2 (two) times daily.       Marland Kitchen Leuprolide Acetate (LUPRON IJ) Inject 1 applicator as directed every 3 (three) months.         Results for orders placed during the hospital encounter of 11/23/12 (from the past 48 hour(s))  URINALYSIS, ROUTINE W REFLEX MICROSCOPIC     Status: Abnormal   Collection Time   11/23/12 11:12 PM      Component Value Range Comment   Color, Urine YELLOW  YELLOW    APPearance TURBID (*) CLEAR    Specific Gravity, Urine 1.013  1.005 - 1.030    pH 6.0  5.0 - 8.0    Glucose, UA NEGATIVE  NEGATIVE mg/dL    Hgb urine dipstick LARGE (*) NEGATIVE    Bilirubin Urine NEGATIVE  NEGATIVE    Ketones, ur NEGATIVE  NEGATIVE mg/dL    Protein,  ur 30 (*) NEGATIVE mg/dL    Urobilinogen, UA 0.2  0.0 - 1.0 mg/dL    Nitrite POSITIVE (*) NEGATIVE    Leukocytes, UA LARGE (*) NEGATIVE   URINE MICROSCOPIC-ADD ON     Status: Abnormal   Collection Time   11/23/12 11:12 PM      Component Value Range Comment   WBC, UA TOO NUMEROUS TO COUNT  <3 WBC/hpf    RBC / HPF 3-6  <3 RBC/hpf    Bacteria, UA FEW (*) RARE   LACTIC ACID, PLASMA     Status: Normal   Collection Time   11/24/12 12:20 AM      Component Value Range Comment   Lactic Acid, Venous 1.2  0.5 - 2.2 mmol/L   COMPREHENSIVE METABOLIC PANEL     Status: Abnormal   Collection Time   11/24/12 12:30 AM      Component Value Range Comment   Sodium 130 (*) 135 - 145 mEq/L    Potassium 3.6  3.5 - 5.1 mEq/L    Chloride 98  96 - 112 mEq/L    CO2 17 (*) 19 - 32 mEq/L    Glucose, Bld 113 (*) 70 - 99 mg/dL    BUN 44 (*) 6 - 23 mg/dL    Creatinine, Ser 1.61 (*) 0.50 - 1.35 mg/dL    Calcium 8.8  8.4 - 09.6 mg/dL    Total Protein 6.9  6.0 - 8.3 g/dL    Albumin 2.4 (*) 3.5 - 5.2 g/dL    AST 21  0 - 37 U/L    ALT 11  0 - 53 U/L    Alkaline Phosphatase 81  39 - 117 U/L    Total Bilirubin 0.3  0.3 - 1.2 mg/dL    GFR calc non Af Amer 40 (*) >90 mL/min    GFR calc Af Amer 46 (*) >90 mL/min   PROTIME-INR     Status: Normal   Collection Time   11/24/12 12:30 AM      Component Value Range Comment   Prothrombin Time 14.8  11.6 - 15.2 seconds    INR 1.18  0.00 - 1.49   CBC WITH DIFFERENTIAL     Status: Abnormal  Collection Time   11/24/12 12:30 AM      Component Value Range Comment   WBC 10.6 (*) 4.0 - 10.5 K/uL    RBC 4.09 (*) 4.22 - 5.81 MIL/uL    Hemoglobin 12.5 (*) 13.0 - 17.0 g/dL    HCT 52.8 (*) 41.3 - 52.0 %    MCV 88.8  78.0 - 100.0 fL    MCH 30.6  26.0 - 34.0 pg    MCHC 34.4  30.0 - 36.0 g/dL    RDW 24.4  01.0 - 27.2 %    Platelets 313  150 - 400 K/uL    Neutrophils Relative 73  43 - 77 %    Neutro Abs 7.7  1.7 - 7.7 K/uL    Lymphocytes Relative 18  12 - 46 %    Lymphs Abs 1.9   0.7 - 4.0 K/uL    Monocytes Relative 8  3 - 12 %    Monocytes Absolute 0.9  0.1 - 1.0 K/uL    Eosinophils Relative 1  0 - 5 %    Eosinophils Absolute 0.1  0.0 - 0.7 K/uL    Basophils Relative 0  0 - 1 %    Basophils Absolute 0.0  0.0 - 0.1 K/uL    Dg Chest 1 View  11/24/2012  *RADIOLOGY REPORT*  Clinical Data: Generalized weakness; history of hypertension.  CHEST - 1 VIEW  Comparison: Chest radiograph performed 09/30/2012  Findings: The lungs are well-aerated.  Chronically increased interstitial markings are seen; mild right midlung opacity noted may simply reflect overlying osseous structures, though minimal pneumonia cannot be excluded.  There is no evidence of focal opacification, pleural effusion or pneumothorax.  The cardiomediastinal silhouette is within normal limits.  No acute osseous abnormalities are seen.  Prominent costochondral calcification is again noted bilaterally at the first ribs.  IMPRESSION: Mild right midlung opacity may simply reflect overlying osseous structures, though minimal pneumonia cannot be excluded.  Chronic lung changes noted.   Original Report Authenticated By: Tonia Ghent, M.D.     Review of Systems  Constitutional: Positive for fever and chills.  HENT: Positive for congestion.   Eyes: Negative.   Respiratory: Positive for shortness of breath.   Cardiovascular: Negative.   Gastrointestinal: Positive for nausea and vomiting.  Genitourinary: Negative.   Musculoskeletal: Negative.   Skin: Negative.   Neurological: Negative.   Endo/Heme/Allergies: Negative.   Psychiatric/Behavioral: Negative.     Blood pressure 100/46, pulse 67, temperature 98 F (36.7 C), temperature source Oral, resp. rate 18, height 5' 10.5" (1.791 m), weight 61.5 kg (135 lb 9.3 oz), SpO2 97.00%. Physical Exam  Constitutional: He is oriented to person, place, and time. He appears well-developed and well-nourished. No distress.  HENT:  Head: Normocephalic and atraumatic.  Right Ear:  External ear normal.  Left Ear: External ear normal.  Nose: Nose normal.  Mouth/Throat: Oropharynx is clear and moist. No oropharyngeal exudate.  Eyes: Conjunctivae normal are normal. Pupils are equal, round, and reactive to light. Right eye exhibits no discharge. Left eye exhibits no discharge. No scleral icterus.  Neck: Normal range of motion. Neck supple.  Cardiovascular: Normal rate.   Respiratory: Effort normal and breath sounds normal. No respiratory distress. He has no wheezes. He has no rales.  GI: Soft. Bowel sounds are normal. He exhibits no distension. There is no tenderness. There is no rebound.  Musculoskeletal: He exhibits no edema.       Right ankle lateral ankle swelling.  Neurological: He is  alert and oriented to person, place, and time.       Moves all extremities.  Skin: He is not diaphoretic.     Assessment/Plan #1. Weakness most likely from dehydration - continue with IV hydration for now. Closely follow intake output and metabolic panel. #2. Fever from UTI - cultures and urine cultures have been sent. Ceftriaxone has been started. #3. ARF and hyponatremia - probably from dehydration. See #1. #4. Possible pneumonia - patient has right lung opacity. This is persistent. CT chest has been ordered without contrast. #5. Atrial fibrillation presently rate controlled - patient was felt to be on a candidate for Coumadin. #6. History of prostate cancer and bladder cancer.  CODE STATUS - DO NOT RESUSCITATE.  Maxwell Martorano N. 11/24/2012, 4:19 AM

## 2012-11-24 NOTE — Progress Notes (Signed)
INITIAL ADULT NUTRITION ASSESSMENT Date: 11/24/2012   Time: 3:48 PM Reason for Assessment: Nutrition risk   INTERVENTION: Ensure Complete BID. Encouraged increased meal intake. RD to monitor intake.   Pt meets criteria for moderate malnutrition of acute illness AEB <50% estimated energy intake with 2.2-2.9% weight loss in the past 5 days per pt report with 4.9% weight loss in the past 3 months.     ASSESSMENT: Male 78 y.o.  Dx: Weakness  Food/Nutrition Related Hx: Pt reports eating poorly for the past 4-5 days r/t not having an appetite and reports 3-4 pound unintended weight loss during this time frame. Pt reports before then he was eating better and drinking 1-2 Ensure/day. Pt denies any problems chewing/swallowing. Pt eating better today with 50% meal intake at breakfast.    Hx:  Past Medical History  Diagnosis Date  . Pancreatitis   . Fractured hip 09/2011  . Prostate tumor     to have bx by Dr. Margarita Grizzle  . UTI (lower urinary tract infection) 06/13/2012    has had x 3 months  . Falls 06/13/12    hit head approx. 1 month ago  . Unsteady gait 06/13/2012    "bad for awhile but getting worse recently"  . Slurred speech     slurred speech on 06/12/12 - didn't last long, slower talking recently  . Cancer     bladder  . Lung abnormality     spot on ct approx 3 months ago - to be followed - [facility]  . 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 Nose fracture     after a fall - Oct. 2012  . Hypertension    Related Meds:  Scheduled Meds:   . [COMPLETED] sodium chloride  1,000 mL Intravenous Once  . [COMPLETED] acetaminophen  650 mg Oral Once  . feeding supplement  237 mL Oral BID BM  . finasteride  5 mg Oral Daily  . fluconazole  50 mg Oral Daily  . gemfibrozil  600 mg Oral BID AC  . imipenem-cilastatin  250 mg Intravenous Q8H  . metoprolol  25 mg Oral BID  . mirtazapine  30 mg Oral QHS  . potassium chloride  40 mEq Oral Once  . saccharomyces boulardii  250  mg Oral BID  . sodium chloride  3 mL Intravenous Q12H  . [DISCONTINUED] azithromycin  500 mg Intravenous Q24H  . [DISCONTINUED] azithromycin  500 mg Intravenous Q0600  . [DISCONTINUED] cefTRIAXone (ROCEPHIN)  IV  1 g Intravenous Q24H  . [DISCONTINUED] cefTRIAXone (ROCEPHIN)  IV  1 g Intravenous QHS   Continuous Infusions:   . sodium chloride 75 mL/hr (11/24/12 1348)  . [DISCONTINUED] sodium chloride 1,000 mL (11/24/12 0137)   PRN Meds:.acetaminophen, acetaminophen, LORazepam, ondansetron (ZOFRAN) IV, ondansetron  Ht: 5' 10.5" (179.1 cm)  Wt: 135 lb 9.3 oz (61.5 kg)  Ideal Wt: 166 lb % Ideal Wt: 81  Usual Wt: 138-139 lb % Usual Wt: 97-98  Wt Readings from Last 10 Encounters:  11/24/12 135 lb 9.3 oz (61.5 kg)  09/19/12 142 lb (64.411 kg)  08/18/12 145 lb (65.772 kg)  07/27/12 147 lb 11.3 oz (67 kg)  06/28/12 145 lb (65.772 kg)  06/15/12 147 lb 11.2 oz (66.996 kg)  10/22/10 168 lb (76.204 kg)  10/17/09 177 lb (80.287 kg)     Body mass index is 19.18 kg/(m^2).  Labs:  CMP     Component Value Date/Time   NA 135 11/24/2012 0545  K 3.2* 11/24/2012 0545   CL 105 11/24/2012 0545   CO2 19 11/24/2012 0545   GLUCOSE 115* 11/24/2012 0545   BUN 41* 11/24/2012 0545   CREATININE 1.55* 11/24/2012 0545   CALCIUM 8.1* 11/24/2012 0545   PROT 5.9* 11/24/2012 0545   ALBUMIN 2.1* 11/24/2012 0545   AST 16 11/24/2012 0545   ALT 9 11/24/2012 0545   ALKPHOS 66 11/24/2012 0545   BILITOT 0.2* 11/24/2012 0545   GFRNONAA 40* 11/24/2012 0545   GFRAA 46* 11/24/2012 0545    Intake/Output Summary (Last 24 hours) at 11/24/12 1554 Last data filed at 11/24/12 1500  Gross per 24 hour  Intake 979.58 ml  Output   1050 ml  Net -70.42 ml   Last BM - 12/4  Diet Order: General   IVF:    sodium chloride Last Rate: 75 mL/hr (11/24/12 1348)  [DISCONTINUED] sodium chloride Last Rate: 1,000 mL (11/24/12 0137)    Estimated Nutritional Needs:   Kcal:1850-2150 Protein:90-110g Fluid:1.8-2.1L  NUTRITION  DIAGNOSIS: -Inadequate oral intake (NI-2.1).  Status: Ongoing  RELATED TO: poor appetite  AS EVIDENCE BY: pt statement  MONITORING/EVALUATION(Goals): Pt to consume >90% of meals/supplements  EDUCATION NEEDS: -No education needs identified at this time   Dietitian #: [phone]  DOCUMENTATION CODES Per approved criteria  -Moderate (non-severe) malnutrition in the context of acute illness or injury    Marshall Cork 11/24/2012, 3:48 PM

## 2012-11-24 NOTE — Progress Notes (Signed)
   CARE MANAGEMENT NOTE 11/24/2012  Patient:  Brent Day, Brent Day   Account Number:  [id number]  Date Initiated:  11/24/2012  Documentation initiated by:  Jiles Crocker  Subjective/Objective Assessment:   ADMITTED WITH FEVER, WEAKNESS, UTI     Action/Plan:   PCP - Dr. Lucky Cowboy.  LIVES WITH FAMILY MEMBERS   Anticipated DC Date:  12/01/2012   Anticipated DC Plan:        DC Planning Services  CM consult          Status of service:  In process, will continue to follow Medicare Important Message given?  NA - LOS <3 / Initial given by admissions (If response is "NO", the following Medicare IM given date fields will be blank)  Per UR Regulation:  Reviewed for med. necessity/level of care/duration of stay  Comments:  11/24/2012- B Melizza Kanode RN,BSN,MHA

## 2012-11-24 NOTE — Progress Notes (Signed)
ANTIBIOTIC CONSULT NOTE - INITIAL  Pharmacy Consult for Primaxin Indication: UTI  No Known Allergies  Patient Measurements: Height: 5' 10.5" (179.1 cm) Weight: 135 lb 9.3 oz (61.5 kg) IBW/kg (Calculated) : 74.15   Vital Signs: Temp: 102.5 F (39.2 C) (12/05 1303) Temp src: Axillary (12/05 1303) BP: 100/46 mmHg (12/05 0403) Pulse Rate: 67  (12/05 0403) Intake/Output from previous day: 12/04 0701 - 12/05 0700 In: 609.6 [P.O.:120; I.V.:239.6; IV Piggyback:250] Out: 600 [Urine:600] Intake/Output from this shift: Total I/O In: 120 [P.O.:120] Out: 450 [Urine:450]  Labs:  [facility] 11/24/12 0545 11/24/12 0030  WBC 9.1 10.6*  HGB 10.3* 12.5*  PLT 270 313  LABCREA -- --  CREATININE 1.55* 1.55*   Estimated Creatinine Clearance: 31.4 ml/min (by C-G formula based on Cr of 1.55). No results found for this basename: VANCOTROUGH:2,VANCOPEAK:2,VANCORANDOM:2,GENTTROUGH:2,GENTPEAK:2,GENTRANDOM:2,TOBRATROUGH:2,TOBRAPEAK:2,TOBRARND:2,AMIKACINPEAK:2,AMIKACINTROU:2,AMIKACIN:2, in the last 72 hours   Microbiology: No results found for this or any previous visit (from the past 720 hour(s)).  Medical History: Past Medical History  Diagnosis Date  . Pancreatitis   . Fractured hip 09/2011  . Prostate tumor     to have bx by Dr. Margarita Grizzle  . UTI (lower urinary tract infection) 06/13/2012    has had x 3 months  . Falls 06/13/12    hit head approx. 1 month ago  . Unsteady gait 06/13/2012    "bad for awhile but getting worse recently"  . Slurred speech     slurred speech on 06/12/12 - didn't last long, slower talking recently  . Cancer     bladder  . Lung abnormality     spot on ct approx 3 months ago - to be followed - [facility]  . 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 Nose fracture     after a fall - Oct. 2012  . Hypertension     Medications:  Scheduled:    . [COMPLETED] sodium chloride  1,000 mL Intravenous Once  . [COMPLETED] acetaminophen  650 mg Oral Once   . feeding supplement  237 mL Oral BID BM  . finasteride  5 mg Oral Daily  . fluconazole  50 mg Oral Daily  . gemfibrozil  600 mg Oral BID AC  . metoprolol  25 mg Oral BID  . mirtazapine  30 mg Oral QHS  . potassium chloride  40 mEq Oral Once  . saccharomyces boulardii  250 mg Oral BID  . sodium chloride  3 mL Intravenous Q12H  . [DISCONTINUED] azithromycin  500 mg Intravenous Q24H  . [DISCONTINUED] azithromycin  500 mg Intravenous Q0600  . [DISCONTINUED] cefTRIAXone (ROCEPHIN)  IV  1 g Intravenous Q24H  . [DISCONTINUED] cefTRIAXone (ROCEPHIN)  IV  1 g Intravenous QHS   Infusions:    . sodium chloride 75 mL/hr (11/24/12 1348)  . [DISCONTINUED] sodium chloride 1,000 mL (11/24/12 0137)   Assessment:  78 yo male with chroninc in-dwelling catheter admitted with weakness and fever/UTI.   Md changing abx's to Primaxin per pharmacy for complicated UTI  Normalized CrCl of 36 ml/min/1.56m2  Goal of Therapy:  eradication of infection  Plan:  Per current renal function and weight, start Primaxin 250mg  IV q8   Hessie Knows, PharmD, BCPS Pager [phone] 11/24/2012 2:09 PM

## 2012-11-25 DIAGNOSIS — R5383 Other fatigue: Secondary | ICD-10-CM

## 2012-11-25 LAB — LIPASE, BLOOD: Lipase: 65 U/L — ABNORMAL HIGH (ref 11–59)

## 2012-11-25 LAB — BASIC METABOLIC PANEL
CO2: 18 mEq/L — ABNORMAL LOW (ref 19–32)
Calcium: 8.5 mg/dL (ref 8.4–10.5)
Creatinine, Ser: 1.22 mg/dL (ref 0.50–1.35)
GFR calc Af Amer: 61 mL/min — ABNORMAL LOW (ref >90)
GFR calc non Af Amer: 53 mL/min — ABNORMAL LOW (ref >90)
Sodium: 136 mEq/L (ref 135–145)

## 2012-11-25 LAB — CBC
MCH: 30.6 pg (ref 26.0–34.0)
MCHC: 33.5 g/dL (ref 30.0–36.0)
MCV: 91.3 fL (ref 78.0–100.0)
Platelets: 259 10*3/uL (ref 150–400)
RBC: 3.43 MIL/uL — ABNORMAL LOW (ref 4.22–5.81)
RDW: 14.6 % (ref 11.5–15.5)

## 2012-11-25 MED ORDER — SODIUM CHLORIDE 0.9 % IV SOLN
250.0000 mg | Freq: Four times a day (QID) | INTRAVENOUS | Status: AC
  Administered 2012-11-25 – 2012-11-28 (×14): 250 mg via INTRAVENOUS
  Filled 2012-11-25 (×16): qty 250

## 2012-11-25 MED ORDER — HYDROCODONE-ACETAMINOPHEN 5-325 MG PO TABS
1.0000 | ORAL_TABLET | ORAL | Status: DC | PRN
Start: 2012-11-25 — End: 2012-11-29
  Administered 2012-11-25 (×2): 1 via ORAL
  Filled 2012-11-25 (×2): qty 1

## 2012-11-25 MED ORDER — VANCOMYCIN HCL 1000 MG IV SOLR
750.0000 mg | INTRAVENOUS | Status: DC
  Administered 2012-11-25 – 2012-11-28 (×4): 750 mg via INTRAVENOUS
  Filled 2012-11-25 (×5): qty 750

## 2012-11-25 MED ORDER — MORPHINE SULFATE 2 MG/ML IJ SOLN: 2.0000 mg | INTRAMUSCULAR | Status: DC | PRN

## 2012-11-25 NOTE — Progress Notes (Signed)
Pt had another 15 beat run of SVT. MD notified.

## 2012-11-25 NOTE — Progress Notes (Addendum)
ANTIBIOTIC CONSULT NOTE - Follow Up  Pharmacy Consult for Primaxin, Vancomycin (start 12/6) Indication: UTI  No Known Allergies  Patient Measurements: Height: 5' 10.5" (179.1 cm) Weight: 135 lb 5.8 oz (61.4 kg) IBW/kg (Calculated) : 74.15   Vital Signs: Temp: 98.6 F (37 C) (12/06 0500) Temp src: Oral (12/06 0500) BP: 110/54 mmHg (12/06 0500) Pulse Rate: 77  (12/06 0500) Intake/Output from previous day: 12/05 0701 - 12/06 0700 In: 2410 [P.O.:490; I.V.:1720; IV Piggyback:200] Out: 3651 [Urine:3650; Stool:1] Intake/Output from this shift:    Labs:  Basename 11/25/12 0542 11/24/12 0545 11/24/12 0030  WBC 9.1 9.1 10.6*  HGB 10.5* 10.3* 12.5*  PLT 259 270 313  LABCREA -- -- --  CREATININE 1.22 1.55* 1.55*   Estimated Creatinine Clearance: 39.8 ml/min (by C-G formula based on Cr of 1.22). No results found for this basename: VANCOTROUGH:2,VANCOPEAK:2,VANCORANDOM:2,GENTTROUGH:2,GENTPEAK:2,GENTRANDOM:2,TOBRATROUGH:2,TOBRAPEAK:2,TOBRARND:2,AMIKACINPEAK:2,AMIKACINTROU:2,AMIKACIN:2, in the last 72 hours   Microbiology: Recent Results (from the past 720 hour(s))  URINE CULTURE     Status: Normal (Preliminary result)   Collection Time   11/23/12 11:12 PM      Component Value Range Status Comment   Specimen Description URINE, CATHETERIZED   Final    Special Requests NONE   Final    Culture  Setup Time 11/24/2012 06:10   Final    Colony Count PENDING   Incomplete    Culture Culture reincubated for better growth   Final    Report Status PENDING   Incomplete   CULTURE, BLOOD (ROUTINE X 2)     Status: Normal (Preliminary result)   Collection Time   11/24/12 12:27 AM      Component Value Range Status Comment   Specimen Description BLOOD UNKNOWN   Final    Special Requests BOTTLES DRAWN AEROBIC AND ANAEROBIC 5CC EACH   Final    Culture  Setup Time 11/24/2012 04:40   Final    Culture     Final    Value: GRAM POSITIVE RODS     5 Note: Gram Stain Report Called to,Read Back By and  Verified With: JENNY BURNS 12 13 7:25PM BY THOMI   Report Status PENDING   Incomplete   CULTURE, BLOOD (ROUTINE X 2)     Status: Normal (Preliminary result)   Collection Time   11/24/12 12:30 AM      Component Value Range Status Comment   Specimen Description BLOOD UNKNOWN   Final    Special Requests BOTTLES DRAWN AEROBIC AND ANAEROBIC 5 CC EACH   Final    Culture  Setup Time 11/24/2012 04:40   Final    Culture     Final    Value:        BLOOD CULTURE RECEIVED NO GROWTH TO DATE CULTURE WILL BE HELD FOR 5 DAYS BEFORE ISSUING A FINAL NEGATIVE REPORT   Report Status PENDING   Incomplete   CULTURE, BLOOD (ROUTINE X 2)     Status: Normal (Preliminary result)   Collection Time   11/24/12  3:00 PM      Component Value Range Status Comment   Specimen Description BLOOD RIGHT ARM   Final    Special Requests BOTTLES DRAWN AEROBIC AND ANAEROBIC 10CC   Final    Culture  Setup Time 11/24/2012 22:46   Final    Culture     Final    Value:        BLOOD CULTURE RECEIVED NO GROWTH TO DATE CULTURE WILL BE HELD FOR 5 DAYS BEFORE ISSUING A FINAL NEGATIVE  REPORT   Report Status PENDING   Incomplete     Medical History: Past Medical History  Diagnosis Date  . Pancreatitis   . Fractured hip 09/2011  . Prostate tumor     to have bx by Dr. Margarita Grizzle  . UTI (lower urinary tract infection) 06/13/2012    has had x 3 months  . Falls 06/13/12    hit head approx. 1 month ago  . Unsteady gait 06/13/2012    "bad for awhile but getting worse recently"  . Slurred speech     slurred speech on 06/12/12 - didn't last long, slower talking recently  . Cancer     bladder  . Lung abnormality     spot on ct approx 3 months ago - to be followed - [facility]  . 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 Nose fracture     after a fall - Oct. 2012  . Hypertension     Medications:  Scheduled:     . feeding supplement  237 mL Oral BID BM  . finasteride  5 mg Oral Daily  . fluconazole  50 mg Oral Daily  .  gemfibrozil  600 mg Oral BID AC  . imipenem-cilastatin  250 mg Intravenous Q8H  . metoprolol  25 mg Oral BID  . mirtazapine  30 mg Oral QHS  . [COMPLETED] potassium chloride  40 mEq Oral Once  . saccharomyces boulardii  250 mg Oral BID  . sodium chloride  3 mL Intravenous Q12H  . [DISCONTINUED] azithromycin  500 mg Intravenous Q0600  . [DISCONTINUED] cefTRIAXone (ROCEPHIN)  IV  1 g Intravenous QHS   Infusions:     . [EXPIRED] sodium chloride 75 mL/hr (11/24/12 1348)   Assessment:  78 yo male with chroninc in-dwelling catheter admitted with weakness and fever/UTI.   Day #2 Primaxin  Improved SCr - normalized CrCl now 46 ml/min/1.81m2  Follow up cx results  Goal of Therapy:  eradication of infection  Plan:  Change Primaxin to 250mg  IV q6 for improved SCr  Hessie Knows, PharmD, BCPS Pager [phone] 11/25/2012 10:02 AM    -------------------------------------------------------------------------------------------------------------------------------------------------------------------------- Addendum:    A/P: Adding vancomycin per pharmacy to Primaxin already going for a fever spike of 100.5 per Md orders. Per patient's current weight and renal function as in note above, will start vancomycin 750mg  IV q24 for a goal trough of 10-15 mcg/mL. Will check a vanc trough as appropriate   Hessie Knows, PharmD, BCPS Pager [phone] 11/25/2012 2:05 PM

## 2012-11-25 NOTE — Progress Notes (Signed)
Urology Progress Note  Subjective:     No acute urologic events overnight. I was notified by the patient's family that he'd been admitted for urinary tract infection. This is a patient who has locally advanced prostate cancer who has been on testosterone blockade. This blockade has caused significant fatigue and weakness. 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 He has required indwelling Foley catheter. He has severe bladder spasms. His last catheter change was 11/08/12. Today, it appears that his catheter is not draining well. I changed his catheter with an 75 French silicone catheter using sterile technique. This was placed with these and flushed well following the procedure.  ROS: Positive: fatigue, fever.  Objective:  Patient Vitals for the past 24 hrs:  BP Temp Temp src Pulse Resp SpO2 Weight  11/25/12 0500 110/54 mmHg 98.6 F (37 C) Oral 77  20  100 % 61.4 kg (135 lb 5.8 oz)  11/24/12 2058 141/70 mmHg 99.7 F (37.6 C) Oral 101  18  93 % -  11/24/12 1400 - 98.5 F (36.9 C) Oral - - - -  11/24/12 1320 105/55 mmHg - - 74  17  98 % -  11/24/12 1303 - 102.5 F (39.2 C) Axillary - - - -    Physical Exam: General:  No acute distress, awake Chest:  CTA-B Abdomen:               []  Soft, appropriately TTP  [x]  Soft, NTTP  []  Soft, appropriately TTP, incision(s) clean/dry/intact  Genitourinary: No edema, circumcised. Foley:  Draining urine with sediment.    I/O last 3 completed shifts: In: 3019.6 [P.O.:610; I.V.:1959.6; IV Piggyback:450] Out: 4250 [Urine:4250]  Recent Labs  [facility] 11/25/12 0542 11/24/12 0545   HGB 10.5* 10.3*   WBC 9.1 9.1   PLT 259 270    Recent Labs  [facility] 11/25/12 0542 11/24/12 0545   NA 136 135   K 4.0 3.2*   CL 108 105   CO2 18* 19   BUN 25* 41*   CREATININE 1.22 1.55*   CALCIUM 8.5 8.1*   GFRNONAA 53* 40*   GFRAA 61* 46*     Recent Labs  Basename 11/24/12 0030   INR 1.18   APTT  --     No components found with this basename: ABG:2    Length of stay: 2 days.  Assessment: Febrile UTI. Advanced prostate cancer.    Plan: -Catheter exchanged this morning. I will schedule him to have his catheter changed in our clinic every 2 weeks. I called his daughter and explained the situation to her. I feel that his locally advanced prostate cancer is likely impinging upon his catheter improving it from draining well. We have discussed suprapubic tube placement in the past. Both the patient today and his daughter are interested in this. He is scheduled to see me 12/22/12, and we will discuss this further at that time.  -He did have some pain with catheter change and a small amount of hematuria afterward. I gave a verbal order for vicodin 5/325mg  tab, 1 tab PO Q4hours prn; but I asked that this be cleared with his attending MD first.  -Continue to treat his urinary tract infection. Treat according to cultures.  -The daughter and the patient are interested in short-term skilled nursing facility placement. I think this would be a good idea given his deconditioned state.   Natalia Leatherwood, MD [phone]

## 2012-11-25 NOTE — Progress Notes (Signed)
[facility] PROGRESS NOTE  Brent Day. WUJ:[id number] DOB: 1927-06-15 DOA: 11/23/2012 PCP: [name], MD  Assessment/Plan: Principal Problem:  *Weakness Active Problems:  Atrial fibrillation  Dehydration  UTI (lower urinary tract infection)  Oral thrush  Pulmonary nodule    1. Weakness: This is generalized, and multi-factorial, secondary to dehydration and infective issues, as well as poor oral intake. Managing as outlined below. For PT/OT evaluation.  2. Fever/UTI: Patient presented with a pyrexia of 103 degrees Farenheit, and was found to have a positive urinary sediment, with significant pyuria, in the setting of a chronic in-dwelling Day catheter. He was initially placed on iv Rocephin, but as he did spike again overnight and this is a complicated UTI, coverage was broadened with Primaxin, now day#2. Urine culture is negative so far, but blood cultures have grown GPR in 1:3 bottles. The clinical significance is unclear at this time. Will await identification. As patient has chills and further fever today, Have added Vancomycin.    3. ARF/Hyponatremia: Creatinine at presentation, was 1.55, BUN 44, consistent with dehydration and volume depletion/AKI, against a background of poor oral intake. Baseline creatinine was 0.98 on 07/2012. He had a mild hyponatremia of 130, which has already improved with iv fluids. Continued on gentle iv fluids, and creatinine is 1.22 today. Oral intake is unreliable, and patient was nauseated today, so will continue iv fluids.  4. Query pneumonia/Pulmonary nodule: CXR suggested a possible right lung pneumonia, but this was not substantiated on chest CT scan, which demonstrated a 3 mm nodule on the right minor fissure . Patient has been reassured accordingly. Follow up CT scan at 1 year, is recommended. Azithromycin has been discontinued.  5. Atrial fibrillation: Patient has a known history of atrial fibrillation. Currently  rate-controlled. Patient was felt not to be on a candidate for Coumadin, due to high fall risk.  6. History of prostate cancer/bladder cancer: Stable. Dr Margarita Grizzle provided urology consultation, performed a catheter change today, and has recommended 2-weekly catheter change, following discharge.  7. Oral thrush: This was an incidental finding on physical examination. Placed on a 7-day course of Diflucan, now day#2.  8. Deconditioning: Patient is debilitated and deconditioned. He will likely need placement.    Code Status: DNR/DNI Family Communication:  Disposition Plan: to be determined.   Brief narrative: 78 year-old male with history of HTN, prostate cancer, chronic indwelling Day catheter, atrial fibrillation felt not a candidate for Coumadin secondary to falls, was brought to the ED for weakness over the last 4-5 days, nasal congestion for a few days and "panting" spells. He did have one episode of vomiting and appetite has been poor. In the ED patient was found to be febrile with temperatures 103F. UA was compatible with UTI. Chest x-ray showed opacity in the right side which may be a pneumonic process. Creatinine was increased from baseline and there was a mild metabolic acidosis. He was admitted for further management.    Consultants:  N/A.   Procedures:  CXR.  Chest CT scan.  Right ankle X-Ray.   Antibiotics:  Rocephin 11/23/12>>>  Azithromycin 11/23/12>>>  HPI/Subjective: Looks unwell, nauseated, had chills today.   Objective: Vital signs in last 24 hours: Temp:  [98.3 F (36.8 C)-100.5 F (38.1 C)] 100.5 F (38.1 C) (12/06 1308) Pulse Rate:  [77-101] 77  (12/06 0500) Resp:  [18-20] 20  (12/06 0500) BP: (110-141)/(54-70) 110/54 mmHg (12/06 0500) SpO2:  [93 %-100 %] 100 % (12/06 0500) Weight:  [61.4 kg (135 lb  5.8 oz)] 61.4 kg (135 lb 5.8 oz) (12/06 0500) Weight change: -0.1 kg (-3.5 oz) Last BM Date: 11/25/12  Intake/Output from previous day: 12/05 0701 -  12/06 0700 In: 2410 [P.O.:490; I.V.:1720; IV Piggyback:200] Out: 3651 [Urine:3650; Stool:1]     Physical Exam: General: Looks unwell, alert, communicative, fully oriented, not short of breath at rest.  HEENT: Mild clinical pallor, no jaundice, no conjunctival injection or discharge. Hydration is fair. Oral thrush has improved.  NECK:  Supple, JVP not seen, no carotid bruits, no palpable lymphadenopathy, no palpable goiter. CHEST:  Clinically clear to auscultation, no wheezes, no crackles. HEART:  Sounds 1 and 2 heard, normal, irregular, no murmurs. ABDOMEN:  Full, soft, mildly tender suprapubically, no palpable organomegaly, no palpable masses, normal bowel sounds. GENITALIA:  Not examined. LOWER EXTREMITIES:  No pitting edema, palpable peripheral pulses. MUSCULOSKELETAL SYSTEM:  Generalized osteoarthritic changes, otherwise, normal. CENTRAL NERVOUS SYSTEM:  No focal neurologic deficit on gross examination.  Lab Results:  [facility] 11/25/12 0542 11/24/12 0545  WBC 9.1 9.1  HGB 10.5* 10.3*  HCT 31.3* 30.1*  PLT 259 270    Basename 11/25/12 0542 11/24/12 0545  NA 136 135  K 4.0 3.2*  CL 108 105  CO2 18* 19  GLUCOSE 100* 115*  BUN 25* 41*  CREATININE 1.22 1.55*  CALCIUM 8.5 8.1*   Recent Results (from the past 240 hour(s))  URINE CULTURE     Status: Normal (Preliminary result)   Collection Time   11/23/12 11:12 PM      Component Value Range Status Comment   Specimen Description URINE, CATHETERIZED   Final    Special Requests NONE   Final    Culture  Setup Time 11/24/2012 06:10   Final    Colony Count PENDING   Incomplete    Culture Culture reincubated for better growth   Final    Report Status PENDING   Incomplete   CULTURE, BLOOD (ROUTINE X 2)     Status: Normal (Preliminary result)   Collection Time   11/24/12 12:27 AM      Component Value Range Status Comment   Specimen Description BLOOD UNKNOWN   Final    Special Requests BOTTLES DRAWN AEROBIC AND ANAEROBIC 5CC EACH    Final    Culture  Setup Time 11/24/2012 04:40   Final    Culture     Final    Value: GRAM POSITIVE RODS     5 Note: Gram Stain Report Called to,Read Back By and Verified With: JENNY BURNS 12 13 7:25PM BY THOMI   Report Status PENDING   Incomplete   CULTURE, BLOOD (ROUTINE X 2)     Status: Normal (Preliminary result)   Collection Time   11/24/12 12:30 AM      Component Value Range Status Comment   Specimen Description BLOOD UNKNOWN   Final    Special Requests BOTTLES DRAWN AEROBIC AND ANAEROBIC 5 CC EACH   Final    Culture  Setup Time 11/24/2012 04:40   Final    Culture     Final    Value:        BLOOD CULTURE RECEIVED NO GROWTH TO DATE CULTURE WILL BE HELD FOR 5 DAYS BEFORE ISSUING A FINAL NEGATIVE REPORT   Report Status PENDING   Incomplete   CULTURE, BLOOD (ROUTINE X 2)     Status: Normal (Preliminary result)   Collection Time   11/24/12  3:00 PM      Component Value Range Status Comment  Specimen Description BLOOD RIGHT ARM   Final    Special Requests BOTTLES DRAWN AEROBIC AND ANAEROBIC 10CC   Final    Culture  Setup Time 11/24/2012 22:46   Final    Culture     Final    Value:        BLOOD CULTURE RECEIVED NO GROWTH TO DATE CULTURE WILL BE HELD FOR 5 DAYS BEFORE ISSUING A FINAL NEGATIVE REPORT   Report Status PENDING   Incomplete      Studies/Results: Dg Chest 1 View  11/24/2012  *RADIOLOGY REPORT*  Clinical Data: Generalized weakness; history of hypertension.  CHEST - 1 VIEW  Comparison: Chest radiograph performed 09/30/2012  Findings: The lungs are well-aerated.  Chronically increased interstitial markings are seen; mild right midlung opacity noted may simply reflect overlying osseous structures, though minimal pneumonia cannot be excluded.  There is no evidence of focal opacification, pleural effusion or pneumothorax.  The cardiomediastinal silhouette is within normal limits.  No acute osseous abnormalities are seen.  Prominent costochondral calcification is again noted  bilaterally at the first ribs.  IMPRESSION: Mild right midlung opacity may simply reflect overlying osseous structures, though minimal pneumonia cannot be excluded.  Chronic lung changes noted.   Original Report Authenticated By: Tonia Ghent, M.D.    Dg Ankle 2 Views Right  11/24/2012  *RADIOLOGY REPORT*  Clinical Data: Right ankle pain for 5 months, no recent injury  RIGHT ANKLE - 2 VIEW  Comparison: None  Findings: Osseous mineralization. Ankle mortise intact. Bulky plantar calcaneal spur. Obliquity on lateral view. No acute fracture, dislocation, or bone destruction.  IMPRESSION: Plantar calcaneal spur. Osseous demineralization.   Original Report Authenticated By: Ulyses Southward, M.D.    Ct Chest Wo Contrast  11/24/2012  *RADIOLOGY REPORT*  Clinical Data: Abnormal chest x-ray.  Question right lung pneumonia/opacity.  History prostate and bladder cancer.  Fever and short of breath.  CT CHEST WITHOUT CONTRAST  Technique:  Multidetector CT imaging of the chest was performed following the standard protocol without IV contrast.  Comparison: Plain film of 11/24/2012.  No prior chest CT.  Findings: Lungs/pleura: Moderate centrilobular emphysema.  Biapical mild pleural parenchymal scarring. 3 mm nodule on the right minor fissure may represent a subpleural lymph node on image 35/series 5. Minimal motion degradation inferiorly. No evidence of pneumonia.  Minimal dependent atelectasis in the posterior right upper lobe and right greater than left lung bases.  No pleural fluid.  Heart/Mediastinum: Tortuous descending thoracic aorta. Normal heart size, without pericardial effusion.  Multivessel coronary artery atherosclerosis.  No mediastinal or hilar adenopathy.  Fluid level within the thoracic esophagus on image 27/series 2.  Mildly dilated more cephalad esophagus.  Upper abdomen: Hepatic cysts, the largest of which is in the posterior right lobe and measures 4.3 cm.  Cholecystectomy. Probable cyst in the upper pole  right kidney.  Mild left adrenal thickening, similar to 04/08/2012.  Pancreatic atrophy and duct dilatation within the tail, as detailed on 04/08/2012 dedicated CT.  Bones/Musculoskeletal:  Mild osteopenia with remote bilateral rib fractures.  Mild wedging of the L1 vertebral body, similar 04/08/2012.  IMPRESSION:  1.  No evidence of right-sided pneumonia or mass. 2.  Centrilobular emphysema with a 3 mm nodule which is likely a subpleural lymph node in the right middle lobe. Given risk factors for bronchogenic carcinoma, follow-up chest CT at 1 year is recommended.  This recommendation follows the consensus statement: "Guidelines for Management of Small Pulmonary Nodules Detected on CT Scans:  A Statement from  the Fleischner Society" as published in Radiology 2005; [id number].  Available online at: DietDisorder.cz.  3.  Findings suggesting esophageal dysmotility or gastroesophageal reflux.   Original Report Authenticated By: Jeronimo Greaves, M.D.     Medications: Scheduled Meds:    . feeding supplement  237 mL Oral BID BM  . finasteride  5 mg Oral Daily  . fluconazole  50 mg Oral Daily  . gemfibrozil  600 mg Oral BID AC  . imipenem-cilastatin  250 mg Intravenous Q6H  . metoprolol  25 mg Oral BID  . mirtazapine  30 mg Oral QHS  . [COMPLETED] potassium chloride  40 mEq Oral Once  . saccharomyces boulardii  250 mg Oral BID  . sodium chloride  3 mL Intravenous Q12H  . [DISCONTINUED] imipenem-cilastatin  250 mg Intravenous Q8H   Continuous Infusions:    . [EXPIRED] sodium chloride 75 mL/hr (11/24/12 1348)   PRN Meds:.acetaminophen, acetaminophen, HYDROcodone-acetaminophen, LORazepam, morphine injection, ondansetron (ZOFRAN) IV, ondansetron    LOS: 2 days   Caroleen Stoermer,CHRISTOPHER  [facility] Pager [phone]. If 8PM-8AM, please contact night-coverage at [email or web address], password [facility] 11/25/2012, 1:57 PM  LOS: 2 days

## 2012-11-25 NOTE — Progress Notes (Signed)
[facility] called and said that pt's blood cultures were gram positive for rods. MD notified.

## 2012-11-25 NOTE — Progress Notes (Signed)
Pt has 15 beats of SVT. Md notified. Pt was asymptomatic.

## 2012-11-26 LAB — COMPREHENSIVE METABOLIC PANEL
ALT: 11 U/L (ref 0–53)
Alkaline Phosphatase: 76 U/L (ref 39–117)
BUN: 21 mg/dL (ref 6–23)
CO2: 20 mEq/L (ref 19–32)
GFR calc Af Amer: 59 mL/min — ABNORMAL LOW (ref >90)
GFR calc non Af Amer: 50 mL/min — ABNORMAL LOW (ref >90)
Glucose, Bld: 103 mg/dL — ABNORMAL HIGH (ref 70–99)
Potassium: 4.2 mEq/L (ref 3.5–5.1)
Sodium: 138 mEq/L (ref 135–145)
Total Bilirubin: 0.4 mg/dL (ref 0.3–1.2)
Total Protein: 6.4 g/dL (ref 6.0–8.3)

## 2012-11-26 LAB — CBC
HCT: 35.4 % — ABNORMAL LOW (ref 39.0–52.0)
Hemoglobin: 11.3 g/dL — ABNORMAL LOW (ref 13.0–17.0)
MCHC: 31.9 g/dL (ref 30.0–36.0)
RBC: 3.8 MIL/uL — ABNORMAL LOW (ref 4.22–5.81)

## 2012-11-26 MED ORDER — SODIUM CHLORIDE 0.9 % IV SOLN
INTRAVENOUS | Status: AC
  Administered 2012-11-26: 15:00:00 via INTRAVENOUS

## 2012-11-26 NOTE — Progress Notes (Signed)
[facility] PROGRESS NOTE  Brent Day. AVW:[id number] DOB: 11/30/1927 DOA: 11/23/2012 PCP: Brent Corwin, MD  Assessment/Plan: Principal Problem:  *Weakness Active Problems:  Atrial fibrillation  Dehydration  UTI (lower urinary tract infection)  Oral thrush  Pulmonary nodule    1. Weakness: This is generalized, and multi-factorial, secondary to dehydration and infective issues, as well as poor oral intake. Managing as outlined below, and appears to be improving gradually.  2. Fever/UTI: Patient presented with a pyrexia of 103 degrees Farenheit, and was found to have a positive urinary sediment with significant pyuria, in the setting of a chronic in-dwelling Day catheter. He was initially placed on iv Rocephin, but as he did spike again overnight and this is a complicated UTI, coverage was broadened. Patient As had chills and further fever on 11/25/12. Urine culture is negative so far, but blood cultures have grown GPR in 1:3 bottles. The clinical significance is unclear at this time. Awaiting identification. Now on vi Primaxin day#3/Vancomycin day#2. No fever recorded overnight, feels better today, and is eating.    3. ARF/Hyponatremia: Creatinine at presentation, was 1.55, BUN 44, consistent with dehydration and volume depletion/AKI, against a background of poor oral intake. Baseline creatinine was 0.98 on 07/2012. He had a mild hyponatremia of 130, which has already improved with iv fluids. Continued on gentle iv fluids, creatinine was 1.22 on 11/25/12, and appears to have plateaued. Oral intake appears to be improved today. Will continue iv fluids for now, but may consider discontinuing fluids on 11/27/12, to avoid fluid overload.  4. Query pneumonia/Pulmonary nodule: CXR suggested a possible right lung pneumonia, but this was not substantiated on chest CT scan, which demonstrated a 3 mm nodule on the right minor fissure . Patient has been reassured accordingly. Follow up  CT scan at 1 year, is recommended. Azithromycin was discontinued on 11/24/12.  5. Atrial fibrillation: Patient has a known history of atrial fibrillation. Currently rate-controlled. Patient was felt not to be on a candidate for Coumadin, due to high fall risk.  6. History of prostate cancer/bladder cancer: Stable. Dr Margarita Grizzle provided urology consultation, performed a catheter change on 11/25/12, and has recommended 2-weekly catheter change, following discharge.  7. Oral thrush: This was an incidental finding on physical examination. Placed on a 7-day course of Diflucan, now day#3.  8. Deconditioning: Patient is debilitated and deconditioned. He will likely need placement. Awaiting PT/OT recommendations.    Code Status: DNR/DNI Family Communication:  Disposition Plan: to be determined.   Brief narrative: 78 year-old male with history of HTN, prostate cancer, chronic indwelling Day catheter, atrial fibrillation felt not a candidate for Coumadin secondary to falls, was brought to the ED for weakness over the last 4-5 days, nasal congestion for a few days and "panting" spells. He did have one episode of vomiting and appetite has been poor. In the ED patient was found to be febrile with temperatures 103F. UA was compatible with UTI. Chest x-ray showed opacity in the right side which may be a pneumonic process. Creatinine was increased from baseline and there was a mild metabolic acidosis. He was admitted for further management.    Consultants:  N/A.   Procedures:  CXR.  Chest CT scan.  Right ankle X-Ray.   Antibiotics:  Rocephin 11/23/12-11/24/12.  Azithromycin 11/24/1311/5/13.  Primaxin 11/24/12>>>>  Vancomycin 11/25/12>>>  HPI/Subjective: Much better, communicative, eating.    Objective: Vital signs in last 24 hours: Temp:  [97.6 F (36.4 C)-101.1 F (38.4 C)] 99.6 F (37.6 C) (12/07  1349) Pulse Rate:  [64-92] 92  (12/07 1349) Resp:  [20-22] 22  (12/07 1349) BP:  (95-132)/(45-109) 123/59 mmHg (12/07 1349) SpO2:  [95 %-97 %] 97 % (12/07 1349) Weight change:  Last BM Date: 11/25/12  Intake/Output from previous day: 12/06 0701 - 12/07 0700 In: 1550 [P.O.:100; IV Piggyback:550] Out: 2000 [Urine:2000] Total I/O In: 240 [P.O.:240] Out: 800 [Urine:800]   Physical Exam: General: Looks much better, alert, communicative, fully oriented, not short of breath at rest.  HEENT: Mild clinical pallor, no jaundice, no conjunctival injection or discharge. Hydration is fair. Oral thrush has improved.  NECK:  Supple, JVP not seen, no carotid bruits, no palpable lymphadenopathy, no palpable goiter. CHEST:  Clinically clear to auscultation, no wheezes, no crackles. HEART:  Sounds 1 and 2 heard, normal, irregular, no murmurs. ABDOMEN:  Full, soft, mildly tender suprapubically, no palpable organomegaly, no palpable masses, normal bowel sounds. GENITALIA:  Not examined. LOWER EXTREMITIES:  No pitting edema, palpable peripheral pulses. MUSCULOSKELETAL SYSTEM:  Generalized osteoarthritic changes, otherwise, normal. CENTRAL NERVOUS SYSTEM:  No focal neurologic deficit on gross examination.  Lab Results:  [facility] 11/26/12 0535 11/25/12 0542  WBC 14.3* 9.1  HGB 11.3* 10.5*  HCT 35.4* 31.3*  PLT 290 259    Basename 11/26/12 0535 11/25/12 0542  NA 138 136  K 4.2 4.0  CL 107 108  CO2 20 18*  GLUCOSE 103* 100*  BUN 21 25*  CREATININE 1.27 1.22  CALCIUM 8.9 8.5   Recent Results (from the past 240 hour(s))  URINE CULTURE     Status: Normal (Preliminary result)   Collection Time   11/23/12 11:12 PM      Component Value Range Status Comment   Specimen Description URINE, CATHETERIZED   Final    Special Requests NONE   Final    Culture  Setup Time 11/24/2012 06:10   Final    Colony Count PENDING   Incomplete    Culture Culture reincubated for better growth   Final    Report Status PENDING   Incomplete   CULTURE, BLOOD (ROUTINE X 2)     Status: Normal  (Preliminary result)   Collection Time   11/24/12 12:27 AM      Component Value Range Status Comment   Specimen Description BLOOD UNKNOWN   Final    Special Requests BOTTLES DRAWN AEROBIC AND ANAEROBIC 5CC EACH   Final    Culture  Setup Time 11/24/2012 04:40   Final    Culture     Final    Value: GRAM POSITIVE RODS     5 Note: Gram Stain Report Called to,Read Back By and Verified With: JENNY BURNS 12 13 7:25PM BY THOMI   Report Status PENDING   Incomplete   CULTURE, BLOOD (ROUTINE X 2)     Status: Normal (Preliminary result)   Collection Time   11/24/12 12:30 AM      Component Value Range Status Comment   Specimen Description BLOOD UNKNOWN   Final    Special Requests BOTTLES DRAWN AEROBIC AND ANAEROBIC 5 CC EACH   Final    Culture  Setup Time 11/24/2012 04:40   Final    Culture     Final    Value:        BLOOD CULTURE RECEIVED NO GROWTH TO DATE CULTURE WILL BE HELD FOR 5 DAYS BEFORE ISSUING A FINAL NEGATIVE REPORT   Report Status PENDING   Incomplete   CULTURE, BLOOD (ROUTINE X 2)     Status:  Normal (Preliminary result)   Collection Time   11/24/12  3:00 PM      Component Value Range Status Comment   Specimen Description BLOOD RIGHT ARM   Final    Special Requests BOTTLES DRAWN AEROBIC AND ANAEROBIC 10CC   Final    Culture  Setup Time 11/24/2012 22:46   Final    Culture     Final    Value:        BLOOD CULTURE RECEIVED NO GROWTH TO DATE CULTURE WILL BE HELD FOR 5 DAYS BEFORE ISSUING A FINAL NEGATIVE REPORT   Report Status PENDING   Incomplete      Studies/Results: No results found.  Medications: Scheduled Meds:    . feeding supplement  237 mL Oral BID BM  . finasteride  5 mg Oral Daily  . fluconazole  50 mg Oral Daily  . gemfibrozil  600 mg Oral BID AC  . imipenem-cilastatin  250 mg Intravenous Q6H  . metoprolol  25 mg Oral BID  . mirtazapine  30 mg Oral QHS  . saccharomyces boulardii  250 mg Oral BID  . sodium chloride  3 mL Intravenous Q12H  . vancomycin  750 mg  Intravenous Q24H   Continuous Infusions:   PRN Meds:.acetaminophen, acetaminophen, HYDROcodone-acetaminophen, LORazepam, morphine injection, ondansetron (ZOFRAN) IV, ondansetron    LOS: 3 days   Hajime Asfaw,CHRISTOPHER  [facility] Pager [phone]. If 8PM-8AM, please contact night-coverage at [email or web address], password [facility] 11/26/2012, 2:10 PM  LOS: 3 days

## 2012-11-27 DIAGNOSIS — R197 Diarrhea, unspecified: Secondary | ICD-10-CM

## 2012-11-27 LAB — BASIC METABOLIC PANEL
BUN: 21 mg/dL (ref 6–23)
Chloride: 105 mEq/L (ref 96–112)
GFR calc Af Amer: 62 mL/min — ABNORMAL LOW (ref >90)
GFR calc non Af Amer: 54 mL/min — ABNORMAL LOW (ref >90)
Potassium: 3.9 mEq/L (ref 3.5–5.1)
Sodium: 136 mEq/L (ref 135–145)

## 2012-11-27 LAB — CULTURE, BLOOD (ROUTINE X 2)

## 2012-11-27 LAB — CBC
MCHC: 32.7 g/dL (ref 30.0–36.0)
Platelets: 298 10*3/uL (ref 150–400)
RDW: 15 % (ref 11.5–15.5)
WBC: 11.5 10*3/uL — ABNORMAL HIGH (ref 4.0–10.5)

## 2012-11-27 LAB — CLOSTRIDIUM DIFFICILE BY PCR: Toxigenic C. Difficile by PCR: NEGATIVE

## 2012-11-27 NOTE — Progress Notes (Signed)
ANTIBIOTIC CONSULT NOTE - Follow Up  Pharmacy Consult for Primaxin Indication: UTI  No Known Allergies  Patient Measurements: Height: 5' 10.5" (179.1 cm) Weight: 135 lb 5.8 oz (61.4 kg) IBW/kg (Calculated) : 74.15   Vital Signs: Temp: 98.2 F (36.8 C) (12/08 0865) Temp src: Oral (12/08 0633) BP: 133/48 mmHg (12/08 7846) Pulse Rate: 90  (12/08 0633) Intake/Output from previous day: 12/07 0701 - 12/08 0700 In: 1975 [P.O.:600; I.V.:825; IV Piggyback:550] Out: 2050 [Urine:2050]  Labs:  [facility] 11/27/12 0515 11/26/12 0535 11/25/12 0542  WBC 11.5* 14.3* 9.1  HGB 10.4* 11.3* 10.5*  PLT 298 290 259  LABCREA -- -- --  CREATININE 1.21 1.27 1.22   Estimated Creatinine Clearance: 40.2 ml/min (by C-G formula based on Cr of 1.21).   Microbiology: Recent Results (from the past 720 hour(s))  URINE CULTURE     Status: Normal (Preliminary result)   Collection Time   11/23/12 11:12 PM      Component Value Range Status Comment   Specimen Description URINE, CATHETERIZED   Final    Special Requests NONE   Final    Culture  Setup Time 11/24/2012 06:10   Final    Colony Count PENDING   Incomplete    Culture Culture reincubated for better growth   Final    Report Status PENDING   Incomplete   CULTURE, BLOOD (ROUTINE X 2)     Status: Normal   Collection Time   11/24/12 12:27 AM      Component Value Range Status Comment   Specimen Description BLOOD UNKNOWN   Final    Special Requests BOTTLES DRAWN AEROBIC AND ANAEROBIC 5CC EACH   Final    Culture  Setup Time 11/24/2012 04:40   Final    Culture     Final    Value: LACTOBACILLUS SPECIES     Note: Standardized susceptibility testing for this organism is not available.     5 Note: Gram Stain Report Called to,Read Back By and Verified With: JENNY BURNS 12 13 7:25PM BY THOMI   Report Status 11/27/2012 FINAL   Final   CULTURE, BLOOD (ROUTINE X 2)     Status: Normal (Preliminary result)   Collection Time   11/24/12 12:30 AM      Component  Value Range Status Comment   Specimen Description BLOOD UNKNOWN   Final    Special Requests BOTTLES DRAWN AEROBIC AND ANAEROBIC 5 CC EACH   Final    Culture  Setup Time 11/24/2012 04:40   Final    Culture     Final    Value:        BLOOD CULTURE RECEIVED NO GROWTH TO DATE CULTURE WILL BE HELD FOR 5 DAYS BEFORE ISSUING A FINAL NEGATIVE REPORT   Report Status PENDING   Incomplete   CULTURE, BLOOD (ROUTINE X 2)     Status: Normal (Preliminary result)   Collection Time   11/24/12  3:00 PM      Component Value Range Status Comment   Specimen Description BLOOD RIGHT ARM   Final    Special Requests BOTTLES DRAWN AEROBIC AND ANAEROBIC 10CC   Final    Culture  Setup Time 11/24/2012 22:46   Final    Culture     Final    Value:        BLOOD CULTURE RECEIVED NO GROWTH TO DATE CULTURE WILL BE HELD FOR 5 DAYS BEFORE ISSUING A FINAL NEGATIVE REPORT   Report Status PENDING   Incomplete  Antibiotics:  12/5 >> Rocephin >> 12/5 12/5 >> Zithromax >> 12/5 12/5 >> Primaxin >> 12/5 >> Fluconazole (thrush) >> 12/6 >> vancomycin >> 12/8   Assessment:  78 yo male with chroninc in-dwelling catheter admitted with weakness and fever/UTI.   Day #4 Primaxin (Vanc order d/c today)  Fevers, WBC and SCr improved.  1/3 Blood cultures with Lactobacillus (question significance?)  Goal of Therapy:  eradication of infection  Plan:   Continue Primaxin 250mg  IV q6.  Follow up renal function and cultures as available.  Lynann Beaver PharmD, BCPS Pager [phone] 11/27/2012 1:03 PM

## 2012-11-27 NOTE — Progress Notes (Signed)
Approx. 3 loose, brown stools this a.m.,. C-diff by PCR negative.

## 2012-11-27 NOTE — Progress Notes (Signed)
[facility] PROGRESS NOTE  Brent Day. ZOX:[id number] DOB: September 11, 1927 DOA: 11/23/2012 PCP: [name], MD  Assessment/Plan: Principal Problem:  *Weakness Active Problems:  Atrial fibrillation  Dehydration  UTI (lower urinary tract infection)  Oral thrush  Pulmonary nodule    1. Weakness: This is generalized, and multi-factorial, secondary to dehydration and infective issues, as well as poor oral intake. Managing as outlined below, and appears to be improving gradually.  2. Fever/UTI: Patient presented with a pyrexia of 103 degrees Farenheit, and was found to have a positive urinary sediment with significant pyuria, in the setting of a chronic in-dwelling Day catheter. He was initially placed on iv Rocephin, but as he did spike again overnight and this is a complicated UTI, coverage was broadened. Patient had chills and further fever on 11/25/12. Urine culture is negative so far, but blood cultures have grown GPR in 1:3 bottles, identified as Lactobacillus spp. The clinical significance is unclear at this time. Now on iv Primaxin day#3/Vancomycin day#2. No further fever recorded, feels better today, and is eating.  Have discontinued Vancomycin today.  2. Diarrhea. Patient has developed diarrhea overnight, described as watery stools. This is likely antibiotic associated. Have sent off stool for C. Difficile PCR.  4. ARF/Hyponatremia: Creatinine at presentation, was 1.55, BUN 44, consistent with dehydration and volume depletion/AKI, against a background of poor oral intake. Baseline creatinine was 0.98 on 07/2012. He had a mild hyponatremia of 130, which has already improved with iv fluids. Continued on gentle iv fluids, creatinine was 1.22 on 11/25/12, and appears to have plateaued. Oral intake has improved. Unfortunately, he now has diarrhea, so will continue iv fluids for now.  4. Query pneumonia/Pulmonary nodule: CXR suggested a possible right lung pneumonia, but this  was not substantiated on chest CT scan, which demonstrated a 3 mm nodule on the right minor fissure . Patient has been reassured accordingly. Follow up CT scan at 1 year, is recommended. Azithromycin was discontinued on 11/24/12.  5. Atrial fibrillation: Patient has a known history of atrial fibrillation. Currently rate-controlled. Patient was felt not to be on a candidate for Coumadin, due to high fall risk.  6. History of prostate cancer/bladder cancer: Stable. Dr Margarita Grizzle provided urology consultation, performed a catheter change on 11/25/12, and has recommended 2-weekly catheter change, following discharge.  7. Oral thrush: This was an incidental finding on physical examination. Placed on a 7-day course of Diflucan, now day#4.  8. Deconditioning: Patient is debilitated and deconditioned. He will likely need placement. Awaiting PT/OT recommendations.    Code Status: DNR/DNI Family Communication:  Disposition Plan: to be determined.   Brief narrative: 78 year-old male with history of HTN, prostate cancer, chronic indwelling Day catheter, atrial fibrillation felt not a candidate for Coumadin secondary to falls, was brought to the ED for weakness over the last 4-5 days, nasal congestion for a few days and "panting" spells. He did have one episode of vomiting and appetite has been poor. In the ED patient was found to be febrile with temperatures 103F. UA was compatible with UTI. Chest x-ray showed opacity in the right side which may be a pneumonic process. Creatinine was increased from baseline and there was a mild metabolic acidosis. He was admitted for further management.    Consultants:  N/A.   Procedures:  CXR.  Chest CT scan.  Right ankle X-Ray.   Antibiotics:  Rocephin 11/23/12-11/24/12.  Azithromycin 11/24/1311/5/13.  Primaxin 11/24/12>>>>  Vancomycin 11/25/12>>>  HPI/Subjective: C/O Diarrhea.   Objective: Vital signs  in last 24 hours: Temp:  [98.2 F (36.8 C)-100.3 F  (37.9 C)] 98.2 F (36.8 C) (12/08 2956) Pulse Rate:  [90-92] 90  (12/08 0633) Resp:  [20-22] 20  (12/08 0633) BP: (101-133)/(48-61) 133/48 mmHg (12/08 0633) SpO2:  [96 %-97 %] 97 % (12/08 2130) Weight change:  Last BM Date: 11/26/12  Intake/Output from previous day: 12/07 0701 - 12/08 0700 In: 1975 [P.O.:600; I.V.:825; IV Piggyback:550] Out: 2050 [Urine:2050] Total I/O In: 240 [P.O.:240] Out: -    Physical Exam: General: Alert, communicative, fully oriented, not short of breath at rest.  HEENT: Mild clinical pallor, no jaundice, no conjunctival injection or discharge. Hydration is satisfactory. Oral thrush has practically resolved.  NECK:  Supple, JVP not seen, no carotid bruits, no palpable lymphadenopathy, no palpable goiter. CHEST:  Clinically clear to auscultation, no wheezes, no crackles. HEART:  Sounds 1 and 2 heard, normal, irregular, no murmurs. ABDOMEN:  Full, soft, mildly tender suprapubically, no palpable organomegaly, no palpable masses, normal bowel sounds. GENITALIA:  Not examined. LOWER EXTREMITIES:  No pitting edema, palpable peripheral pulses. MUSCULOSKELETAL SYSTEM:  Generalized osteoarthritic changes, otherwise, normal. CENTRAL NERVOUS SYSTEM:  No focal neurologic deficit on gross examination.  Lab Results:  [facility] 11/27/12 0515 11/26/12 0535  WBC 11.5* 14.3*  HGB 10.4* 11.3*  HCT 31.8* 35.4*  PLT 298 290    Basename 11/27/12 0515 11/26/12 0535  NA 136 138  K 3.9 4.2  CL 105 107  CO2 21 20  GLUCOSE 103* 103*  BUN 21 21  CREATININE 1.21 1.27  CALCIUM 8.7 8.9   Recent Results (from the past 240 hour(s))  URINE CULTURE     Status: Normal (Preliminary result)   Collection Time   11/23/12 11:12 PM      Component Value Range Status Comment   Specimen Description URINE, CATHETERIZED   Final    Special Requests NONE   Final    Culture  Setup Time 11/24/2012 06:10   Final    Colony Count PENDING   Incomplete    Culture Culture reincubated for  better growth   Final    Report Status PENDING   Incomplete   CULTURE, BLOOD (ROUTINE X 2)     Status: Normal   Collection Time   11/24/12 12:27 AM      Component Value Range Status Comment   Specimen Description BLOOD UNKNOWN   Final    Special Requests BOTTLES DRAWN AEROBIC AND ANAEROBIC 5CC EACH   Final    Culture  Setup Time 11/24/2012 04:40   Final    Culture     Final    Value: LACTOBACILLUS SPECIES     Note: Standardized susceptibility testing for this organism is not available.     5 Note: Gram Stain Report Called to,Read Back By and Verified With: JENNY BURNS 12 13 7:25PM BY THOMI   Report Status 11/27/2012 FINAL   Final   CULTURE, BLOOD (ROUTINE X 2)     Status: Normal (Preliminary result)   Collection Time   11/24/12 12:30 AM      Component Value Range Status Comment   Specimen Description BLOOD UNKNOWN   Final    Special Requests BOTTLES DRAWN AEROBIC AND ANAEROBIC 5 CC EACH   Final    Culture  Setup Time 11/24/2012 04:40   Final    Culture     Final    Value:        BLOOD CULTURE RECEIVED NO GROWTH TO DATE CULTURE WILL BE HELD  FOR 5 DAYS BEFORE ISSUING A FINAL NEGATIVE REPORT   Report Status PENDING   Incomplete   CULTURE, BLOOD (ROUTINE X 2)     Status: Normal (Preliminary result)   Collection Time   11/24/12  3:00 PM      Component Value Range Status Comment   Specimen Description BLOOD RIGHT ARM   Final    Special Requests BOTTLES DRAWN AEROBIC AND ANAEROBIC 10CC   Final    Culture  Setup Time 11/24/2012 22:46   Final    Culture     Final    Value:        BLOOD CULTURE RECEIVED NO GROWTH TO DATE CULTURE WILL BE HELD FOR 5 DAYS BEFORE ISSUING A FINAL NEGATIVE REPORT   Report Status PENDING   Incomplete      Studies/Results: No results found.  Medications: Scheduled Meds:    . feeding supplement  237 mL Oral BID BM  . finasteride  5 mg Oral Daily  . fluconazole  50 mg Oral Daily  . gemfibrozil  600 mg Oral BID AC  . imipenem-cilastatin  250 mg Intravenous Q6H   . metoprolol  25 mg Oral BID  . mirtazapine  30 mg Oral QHS  . saccharomyces boulardii  250 mg Oral BID  . sodium chloride  3 mL Intravenous Q12H  . vancomycin  750 mg Intravenous Q24H   Continuous Infusions:    . sodium chloride 50 mL/hr at 11/26/12 1430   PRN Meds:.acetaminophen, acetaminophen, HYDROcodone-acetaminophen, LORazepam, morphine injection, ondansetron (ZOFRAN) IV, ondansetron    LOS: 4 days   Kamel Haven,Brent Day  [facility] Pager [phone]. If 8PM-8AM, please contact night-coverage at [email or web address], password [facility] 11/27/2012, 12:08 PM  LOS: 4 days

## 2012-11-28 LAB — BASIC METABOLIC PANEL
CO2: 22 mEq/L (ref 19–32)
Calcium: 8.3 mg/dL — ABNORMAL LOW (ref 8.4–10.5)
GFR calc non Af Amer: 74 mL/min — ABNORMAL LOW (ref >90)
Potassium: 3.4 mEq/L — ABNORMAL LOW (ref 3.5–5.1)
Sodium: 137 mEq/L (ref 135–145)

## 2012-11-28 LAB — CBC
Hemoglobin: 9.8 g/dL — ABNORMAL LOW (ref 13.0–17.0)
MCH: 29.9 pg (ref 26.0–34.0)
Platelets: 276 10*3/uL (ref 150–400)
RBC: 3.28 MIL/uL — ABNORMAL LOW (ref 4.22–5.81)
WBC: 9.6 10*3/uL (ref 4.0–10.5)

## 2012-11-28 MED ORDER — CIPROFLOXACIN HCL 500 MG PO TABS
500.0000 mg | ORAL_TABLET | Freq: Two times a day (BID) | ORAL | Status: DC
  Administered 2012-11-29: 500 mg via ORAL
  Filled 2012-11-28 (×3): qty 1

## 2012-11-28 MED ORDER — LOPERAMIDE HCL 2 MG PO CAPS
2.0000 mg | ORAL_CAPSULE | Freq: Three times a day (TID) | ORAL | Status: DC
  Administered 2012-11-28 – 2012-11-29 (×3): 2 mg via ORAL
  Filled 2012-11-28 (×5): qty 1

## 2012-11-28 MED ORDER — POTASSIUM CHLORIDE CRYS ER 20 MEQ PO TBCR
40.0000 meq | EXTENDED_RELEASE_TABLET | Freq: Once | ORAL | Status: AC
  Administered 2012-11-28: 40 meq via ORAL
  Filled 2012-11-28: qty 2

## 2012-11-28 NOTE — Discharge Summary (Signed)
Physician Discharge Summary  Brent Day. ZOX:[id number] DOB: February 16, 1927 DOA: 11/23/2012  PCP: [name], MD  Admit date: 11/23/2012 Discharge date: 11/29/2012  Time spent: 40  minutes  Recommendations for Outpatient Follow-up:  1. Follow up with PMD. 2. Follow up with Dr Margarita Grizzle, urologist.  3. PMD to arrange follow up CT scan in 1 year, to evaluate right pulmonary nodule.   Discharge Diagnoses:  Principal Problem:  *Weakness Active Problems:  Atrial fibrillation  Dehydration  UTI (lower urinary tract infection)  Oral thrush  Pulmonary nodule  Diarrhea   Discharge Condition: Satisfactory.   Diet recommendation: Regular.   Filed Weights   11/24/12 0403 11/25/12 0500 11/28/12 0500  Weight: 61.5 kg (135 lb 9.3 oz) 61.4 kg (135 lb 5.8 oz) 64.048 kg (141 lb 3.2 oz)    History of present illness:  78 year-old male with history of HTN, prostate cancer, chronic indwelling Day catheter, atrial fibrillation felt not a candidate for Coumadin secondary to falls, was brought to the ED for weakness over the last 4-5 days, nasal congestion for a few days and "panting" spells. He did have one episode of vomiting and appetite has been poor. In the ED patient was found to be febrile with temperatures 103F. UA was compatible with UTI. Chest x-ray showed opacity in the right side which may be a pneumonic process. Creatinine was increased from baseline and there was a mild metabolic acidosis. He was admitted for further management.    Hospital Course:  1. Weakness: This is generalized, and multi-factorial, secondary to dehydration and infective issues, as well as poor oral intake. Managing as outlined below, with satisfactory clinical response.  2. Fever/UTI: Patient presented with a pyrexia of 103 degrees Farenheit, and was found to have a positive urinary sediment with significant pyuria, in the setting of a chronic in-dwelling Day catheter. He was initially placed on iv  Rocephin, but as he did spike again overnight and this was a complicated UTI, coverage was broadened, and antibiotic, changed to Primaxin. Vancomycin was added a day later. He had chills and further fever on 11/25/12. Blood cultures grew GPR in 1:3 bottles, identified as Lactobacillus spp. The clinical significance is unclear at this time. He improved clinically, and by 11/28/12, was pyrexia-free for about 48 hours. Vancomycin was discontinued on 11/27/12, without relapse of pyrexia. Well being has markedly improved, as has oral intake. Urine culture grew Pseudomonas Aeruginosa and Serratia Liquefaciens,  Both sensitive to Cefepime, but resistant to quinolones. Have discussed with Dr Cliffton Asters, infectious diseases specialist, and he has recommended a further 5 days of iv Cefepime, to complete 10 days of antibiotic therapy on 12/04/12.  2. Diarrhea. Patient developed diarrhea overnight on 11/28/12, described as watery stools. This is likely antibiotic associated. Fortunately, stool C. Difficile PCR was negative, and by 11/28/12, stool is better formed and much reduced in frequency. Managed with  Loperamide, from that date, with good effect and resolution.  4. ARF/Hyponatremia: Creatinine at presentation, was 1.55, BUN 44, consistent with dehydration and volume depletion/AKI, against a background of poor oral intake. Baseline creatinine was 0.98 on 07/2012. He had a mild hyponatremia of 130, which resolved with saline infusion, creatinine was 1.22 on 11/25/12, and 0.97 on 11/28/12. IV fluids were discontinued on that date, without any deleterious effect.   5. Query pneumonia/Pulmonary nodule: CXR suggested a possible right lung pneumonia, but this was not substantiated on chest CT scan, which demonstrated a 3 mm nodule on the right minor fissure .  Patient has been reassured accordingly. Follow up CT scan at 1 year, is recommended. Azithromycin was discontinued on 11/24/12.  6. Atrial fibrillation: Patient has a known  history of atrial fibrillation. Currently rate-controlled. Patient was felt not to be on a candidate for Coumadin, due to high fall risk.  7. History of prostate cancer/bladder cancer: Stable. Dr Margarita Grizzle provided urology consultation, performed a catheter change on 11/25/12, and has recommended 2-weekly catheter change, following discharge.  8. Oral thrush: This was an incidental finding on physical examination. Placed on a 7-day course of Diflucan, to be concluded on 11/30/12.  9. Deconditioning: Patient is debilitated and deconditioned. Evaluated by PT/OT and SNF recommended.    Procedures:  See Below.   Consultations:  Dr Natalia Leatherwood, urologist.   Discharge Exam: Filed Vitals:   11/28/12 1515 11/28/12 2118 11/29/12 0604 11/29/12 1349  BP: 124/44 117/60 124/61 98/53  Pulse: 78 78 79 72  Temp: 98.7 F (37.1 C) 98.5 F (36.9 C) 98 F (36.7 C) 97.3 F (36.3 C)  TempSrc: Oral Oral Oral Oral  Resp: 20 16 16 17   Height:      Weight:      SpO2: 100% 100% 98% 96%    General: Alert, communicative, fully oriented, not short of breath at rest.  HEENT: Mild clinical pallor, no jaundice, no conjunctival injection or discharge. Hydration is satisfactory. Oral thrush has practically resolved.  NECK: Supple, JVP not seen, no carotid bruits, no palpable lymphadenopathy, no palpable goiter.  CHEST: Clinically clear to auscultation, no wheezes, no crackles.  HEART: Sounds 1 and 2 heard, normal, irregular, no murmurs.  ABDOMEN: Full, soft, mildly tender suprapubically, no palpable organomegaly, no palpable masses, normal bowel sounds.  GENITALIA: Not examined.  LOWER EXTREMITIES: No pitting edema, palpable peripheral pulses.  MUSCULOSKELETAL SYSTEM: Generalized osteoarthritic changes, otherwise, normal.  CENTRAL NERVOUS SYSTEM: No focal neurologic deficit on gross examination.  Discharge Instructions      Discharge Orders    Future Orders Please Complete By Expires   Diet - low  sodium heart healthy      Increase activity slowly          Medication List     As of 11/29/2012  2:21 PM    TAKE these medications         acetaminophen 650 MG CR tablet   Commonly known as: TYLENOL   Take 650 mg by mouth every 8 (eight) hours as needed. For fever      b complex vitamins tablet   Take 1 tablet by mouth daily.      dextrose 5 % SOLN 50 mL with ceFEPIme 1 G SOLR 1 g   Inject 1 g into the vein every 12 (twelve) hours.      Ensure Plus Liqd   Take 237 mLs by mouth 2 (two) times daily between meals.      finasteride 5 MG tablet   Commonly known as: PROSCAR   Take 5 mg by mouth daily.      Flaxseed (Linseed) Oil   Take 2 tablets by mouth daily.      fluconazole 50 MG tablet   Commonly known as: DIFLUCAN   Take 1 tablet (50 mg total) by mouth daily.      gemfibrozil 600 MG tablet   Commonly known as: LOPID   Take 600 mg by mouth 2 (two) times daily before a meal.      loperamide 2 MG capsule   Commonly known as: IMODIUM  Take 1 capsule (2 mg total) by mouth 4 (four) times daily as needed for diarrhea or loose stools.      LORazepam 1 MG tablet   Commonly known as: ATIVAN   Take 0.5-1 mg by mouth 3 (three) times daily as needed. As needed for anxiety  Or sleep      LUPRON IJ   Inject 1 applicator as directed every 3 (three) months.      metoprolol 50 MG tablet   Commonly known as: LOPRESSOR   Take 25 mg by mouth 2 (two) times daily.      mirtazapine 30 MG tablet   Commonly known as: REMERON   Take 30 mg by mouth at bedtime.      saccharomyces boulardii 250 MG capsule   Commonly known as: FLORASTOR   Take 250 mg by mouth 2 (two) times daily.      Vitamin D3 2000 UNITS capsule   Take 2,000 Units by mouth daily.         Follow-up Information    Schedule an appointment as soon as possible for a visit with [name], MD.   Contact information:   [address] [place] [address] [phone]       Schedule  an appointment as soon as possible for a visit with Milford Cage, MD.   Contact information:   [address] [place] [address]LO[address] [place] [address] [phone]           The results of significant diagnostics from this hospitalization (including imaging, microbiology, ancillary and laboratory) are listed below for reference.    Significant Diagnostic Studies: Dg Chest 1 View  11/24/2012  *RADIOLOGY REPORT*  Clinical Data: Generalized weakness; history of hypertension.  CHEST - 1 VIEW  Comparison: Chest radiograph performed 09/30/2012  Findings: The lungs are well-aerated.  Chronically increased interstitial markings are seen; mild right midlung opacity noted may simply reflect overlying osseous structures, though minimal pneumonia cannot be excluded.  There is no evidence of focal opacification, pleural effusion or pneumothorax.  The cardiomediastinal silhouette is within normal limits.  No acute osseous abnormalities are seen.  Prominent costochondral calcification is again noted bilaterally at the first ribs.  IMPRESSION: Mild right midlung opacity may simply reflect overlying osseous structures, though minimal pneumonia cannot be excluded.  Chronic lung changes noted.   Original Report Authenticated By: Tonia Ghent, M.D.    Dg Ankle 2 Views Right  11/24/2012  *RADIOLOGY REPORT*  Clinical Data: Right ankle pain for 5 months, no recent injury  RIGHT ANKLE - 2 VIEW  Comparison: None  Findings: Osseous mineralization. Ankle mortise intact. Bulky plantar calcaneal spur. Obliquity on lateral view. No acute fracture, dislocation, or bone destruction.  IMPRESSION: Plantar calcaneal spur. Osseous demineralization.   Original Report Authenticated By: Ulyses Southward, M.D.    Ct Chest Wo Contrast  11/24/2012  *RADIOLOGY REPORT*  Clinical Data: Abnormal chest x-ray.  Question right lung pneumonia/opacity.  History prostate and bladder cancer.  Fever and short of  breath.  CT CHEST WITHOUT CONTRAST  Technique:  Multidetector CT imaging of the chest was performed following the standard protocol without IV contrast.  Comparison: Plain film of 11/24/2012.  No prior chest CT.  Findings: Lungs/pleura: Moderate centrilobular emphysema.  Biapical mild pleural parenchymal scarring. 3 mm nodule on the right minor fissure may represent a subpleural lymph node on image 35/series 5. Minimal motion degradation inferiorly. No evidence of pneumonia.  Minimal dependent atelectasis in the posterior  right upper lobe and right greater than left lung bases.  No pleural fluid.  Heart/Mediastinum: Tortuous descending thoracic aorta. Normal heart size, without pericardial effusion.  Multivessel coronary artery atherosclerosis.  No mediastinal or hilar adenopathy.  Fluid level within the thoracic esophagus on image 27/series 2.  Mildly dilated more cephalad esophagus.  Upper abdomen: Hepatic cysts, the largest of which is in the posterior right lobe and measures 4.3 cm.  Cholecystectomy. Probable cyst in the upper pole right kidney.  Mild left adrenal thickening, similar to 04/08/2012.  Pancreatic atrophy and duct dilatation within the tail, as detailed on 04/08/2012 dedicated CT.  Bones/Musculoskeletal:  Mild osteopenia with remote bilateral rib fractures.  Mild wedging of the L1 vertebral body, similar 04/08/2012.  IMPRESSION:  1.  No evidence of right-sided pneumonia or mass. 2.  Centrilobular emphysema with a 3 mm nodule which is likely a subpleural lymph node in the right middle lobe. Given risk factors for bronchogenic carcinoma, follow-up chest CT at 1 year is recommended.  This recommendation follows the consensus statement: "Guidelines for Management of Small Pulmonary Nodules Detected on CT Scans:  A Statement from the Fleischner Society" as published in Radiology 2005; 237:395-400.  Available online at: DietDisorder.cz.  3.  Findings suggesting  esophageal dysmotility or gastroesophageal reflux.   Original Report Authenticated By: Jeronimo Greaves, M.D.     Microbiology: Recent Results (from the past 240 hour(s))  URINE CULTURE     Status: Normal   Collection Time   11/23/12 11:12 PM      Component Value Range Status Comment   Specimen Description URINE, CATHETERIZED   Final    Special Requests NONE   Final    Culture  Setup Time 11/24/2012 06:10   Final    Colony Count >=100,000 COLONIES/ML   Final    Culture     Final    Value: PSEUDOMONAS AERUGINOSA     SERRATIA LIQUEFACIENS   Report Status 11/29/2012 FINAL   Final    Organism ID, Bacteria PSEUDOMONAS AERUGINOSA   Final    Organism ID, Bacteria SERRATIA LIQUEFACIENS   Final   CULTURE, BLOOD (ROUTINE X 2)     Status: Normal   Collection Time   11/24/12 12:27 AM      Component Value Range Status Comment   Specimen Description BLOOD UNKNOWN   Final    Special Requests BOTTLES DRAWN AEROBIC AND ANAEROBIC 5CC EACH   Final    Culture  Setup Time 11/24/2012 04:40   Final    Culture     Final    Value: LACTOBACILLUS SPECIES     Note: Standardized susceptibility testing for this organism is not available.     5 Note: Gram Stain Report Called to,Read Back By and Verified With: JENNY BURNS 12 13 7:25PM BY THOMI   Report Status 11/27/2012 FINAL   Final   CULTURE, BLOOD (ROUTINE X 2)     Status: Normal (Preliminary result)   Collection Time   11/24/12 12:30 AM      Component Value Range Status Comment   Specimen Description BLOOD UNKNOWN   Final    Special Requests BOTTLES DRAWN AEROBIC AND ANAEROBIC 5 CC EACH   Final    Culture  Setup Time 11/24/2012 04:40   Final    Culture     Final    Value:        BLOOD CULTURE RECEIVED NO GROWTH TO DATE CULTURE WILL BE HELD FOR 5 DAYS BEFORE ISSUING A FINAL NEGATIVE  REPORT   Report Status PENDING   Incomplete   CULTURE, BLOOD (ROUTINE X 2)     Status: Normal (Preliminary result)   Collection Time   11/24/12  3:00 PM      Component Value Range  Status Comment   Specimen Description BLOOD RIGHT ARM   Final    Special Requests BOTTLES DRAWN AEROBIC AND ANAEROBIC 10CC   Final    Culture  Setup Time 11/24/2012 22:46   Final    Culture     Final    Value:        BLOOD CULTURE RECEIVED NO GROWTH TO DATE CULTURE WILL BE HELD FOR 5 DAYS BEFORE ISSUING A FINAL NEGATIVE REPORT   Report Status PENDING   Incomplete   CLOSTRIDIUM DIFFICILE BY PCR     Status: Normal   Collection Time   11/27/12 11:59 AM      Component Value Range Status Comment   C difficile by pcr NEGATIVE  NEGATIVE Final      Labs: Basic Metabolic Panel:  Lab 11/29/12 1610 11/28/12 0454 11/27/12 0515 11/26/12 0535 11/25/12 0542  NA 136 137 136 138 136  K 4.1 3.4* 3.9 4.2 4.0  CL 104 104 105 107 108  CO2 24 22 21 20  18*  GLUCOSE 97 105* 103* 103* 100*  BUN 15 17 21 21  25*  CREATININE 0.94 0.97 1.21 1.27 1.22  CALCIUM 8.9 8.3* 8.7 8.9 8.5  MG -- -- -- -- --  PHOS -- -- -- -- --   Liver Function Tests:  Lab 11/26/12 0535 11/24/12 0545 11/24/12 0030  AST 32 16 21  ALT 11 9 11   ALKPHOS 76 66 81  BILITOT 0.4 0.2* 0.3  PROT 6.4 5.9* 6.9  ALBUMIN 2.0* 2.1* 2.4*    Lab 11/25/12 1430  LIPASE 65*  AMYLASE --   No results found for this basename: AMMONIA:5 in the last 168 hours CBC:  Lab 11/29/12 0529 11/28/12 0454 11/27/12 0515 11/26/12 0535 11/25/12 0542 11/24/12 0545 11/24/12 0030  WBC 7.1 9.6 11.5* 14.3* 9.1 -- --  NEUTROABS -- -- -- -- -- 6.4 7.7  HGB 9.9* 9.8* 10.4* 11.3* 10.5* -- --  HCT 29.9* 30.2* 31.8* 35.4* 31.3* -- --  MCV 92.6 92.1 92.4 93.2 91.3 -- --  PLT 288 276 298 290 259 -- --   Cardiac Enzymes: No results found for this basename: CKTOTAL:5,CKMB:5,CKMBINDEX:5,TROPONINI:5 in the last 168 hours BNP: BNP (last 3 results) No results found for this basename: PROBNP:3 in the last 8760 hours CBG: No results found for this basename: GLUCAP:5 in the last 168 hours     Signed:  Canda Podgorski,CHRISTOPHER  [facility] 11/29/2012, 2:21  PM

## 2012-11-28 NOTE — Progress Notes (Signed)
[facility] PROGRESS NOTE  Brent Day. ZOX:[id number] DOB: 1927/11/05 DOA: 11/23/2012 PCP: [name], MD  Assessment/Plan: Principal Problem:  *Weakness Active Problems:  Atrial fibrillation  Dehydration  UTI (lower urinary tract infection)  Oral thrush  Pulmonary nodule  Diarrhea    1. Weakness: This is generalized, and multi-factorial, secondary to dehydration and infective issues, as well as poor oral intake. Managing as outlined below, and appears to be improving gradually.  2. Fever/UTI: Patient presented with a pyrexia of 103 degrees Farenheit, and was found to have a positive urinary sediment with significant pyuria, in the setting of a chronic in-dwelling Day catheter. He was initially placed on iv Rocephin, but as he did spike again overnight and this is a complicated UTI, coverage was broadened. Patient had chills and further fever on 11/25/12. Urine culture is negative so far, but blood cultures have grown GPR in 1:3 bottles, identified as Lactobacillus spp. The clinical significance is unclear at this time. Now on iv Primaxin day#4. Vancomycin was discontinued on 11/27/12. No further fever recorded, feels better and is eating well.   2. Diarrhea. Patient has developed diarrhea overnight on 11/28/12, described as watery stools. This is likely antibiotic associated. Fortunately, stool C. Difficile PCR is negative. Stool is better formed today. Will utilize Loperamide.  4. ARF/Hyponatremia: Creatinine at presentation, was 1.55, BUN 44, consistent with dehydration and volume depletion/AKI, against a background of poor oral intake. Baseline creatinine was 0.98 on 07/2012. He had a mild hyponatremia of 130, which has already improved with iv fluids. Continued on gentle iv fluids, creatinine was 1.22 on 11/25/12, and 0.97 today. Oral intake has improved. As diarrhea, has improved, will discontinue iv fluids later today.  4. Query pneumonia/Pulmonary nodule: CXR  suggested a possible right lung pneumonia, but this was not substantiated on chest CT scan, which demonstrated a 3 mm nodule on the right minor fissure . Patient has been reassured accordingly. Follow up CT scan at 1 year, is recommended. Azithromycin was discontinued on 11/24/12.  5. Atrial fibrillation: Patient has a known history of atrial fibrillation. Currently rate-controlled. Patient was felt not to be on a candidate for Coumadin, due to high fall risk.  6. History of prostate cancer/bladder cancer: Stable. Dr Margarita Grizzle provided urology consultation, performed a catheter change on 11/25/12, and has recommended 2-weekly catheter change, following discharge.  7. Oral thrush: This was an incidental finding on physical examination. Placed on a 7-day course of Diflucan, now day#5.  8. Deconditioning: Patient is debilitated and deconditioned. He will likely need placement. Awaiting PT/OT recommendations.    Code Status: DNR/DNI Family Communication:  Disposition Plan: to be determined.   Brief narrative: 78 year-old male with history of HTN, prostate cancer, chronic indwelling Day catheter, atrial fibrillation felt not a candidate for Coumadin secondary to falls, was brought to the ED for weakness over the last 4-5 days, nasal congestion for a few days and "panting" spells. He did have one episode of vomiting and appetite has been poor. In the ED patient was found to be febrile with temperatures 103F. UA was compatible with UTI. Chest x-ray showed opacity in the right side which may be a pneumonic process. Creatinine was increased from baseline and there was a mild metabolic acidosis. He was admitted for further management.    Consultants:  N/A.   Procedures:  CXR.  Chest CT scan.  Right ankle X-Ray.   Antibiotics:  Rocephin 11/23/12-11/24/12.  Azithromycin 11/24/1311/5/13.  Primaxin 11/24/12>>>>  Vancomycin 11/25/12>>>  HPI/Subjective:  Semi-formed stools today. No new issues.    Objective: Vital signs in last 24 hours: Temp:  [98.9 F (37.2 C)-99.2 F (37.3 C)] 99.2 F (37.3 C) (12/09 0644) Pulse Rate:  [80-88] 80  (12/09 0907) Resp:  [18-20] 18  (12/09 0644) BP: (115-119)/(51-61) 115/52 mmHg (12/09 0907) SpO2:  [97 %] 97 % (12/09 0644) Weight:  [64.048 kg (141 lb 3.2 oz)] 64.048 kg (141 lb 3.2 oz) (12/09 0500) Weight change:  Last BM Date: 11/28/12  Intake/Output from previous day: 12/08 0701 - 12/09 0700 In: 910 [P.O.:360; I.V.:100; IV Piggyback:450] Out: 2250 [Urine:2250] Total I/O In: 240 [P.O.:240] Out: -    Physical Exam: General: Alert, communicative, fully oriented, not short of breath at rest.  HEENT: Mild clinical pallor, no jaundice, no conjunctival injection or discharge. Hydration is satisfactory. Oral thrush has practically resolved.  NECK:  Supple, JVP not seen, no carotid bruits, no palpable lymphadenopathy, no palpable goiter. CHEST:  Clinically clear to auscultation, no wheezes, no crackles. HEART:  Sounds 1 and 2 heard, normal, irregular, no murmurs. ABDOMEN:  Full, soft, mildly tender suprapubically, no palpable organomegaly, no palpable masses, normal bowel sounds. GENITALIA:  Not examined. LOWER EXTREMITIES:  No pitting edema, palpable peripheral pulses. MUSCULOSKELETAL SYSTEM:  Generalized osteoarthritic changes, otherwise, normal. CENTRAL NERVOUS SYSTEM:  No focal neurologic deficit on gross examination.  Lab Results:  [facility] 11/28/12 0454 11/27/12 0515  WBC 9.6 11.5*  HGB 9.8* 10.4*  HCT 30.2* 31.8*  PLT 276 298    Basename 11/28/12 0454 11/27/12 0515  NA 137 136  K 3.4* 3.9  CL 104 105  CO2 22 21  GLUCOSE 105* 103*  BUN 17 21  CREATININE 0.97 1.21  CALCIUM 8.3* 8.7   Recent Results (from the past 240 hour(s))  URINE CULTURE     Status: Normal (Preliminary result)   Collection Time   11/23/12 11:12 PM      Component Value Range Status Comment   Specimen Description URINE, CATHETERIZED   Final     Special Requests NONE   Final    Culture  Setup Time 11/24/2012 06:10   Final    Colony Count PENDING   Incomplete    Culture Culture reincubated for better growth   Final    Report Status PENDING   Incomplete   CULTURE, BLOOD (ROUTINE X 2)     Status: Normal   Collection Time   11/24/12 12:27 AM      Component Value Range Status Comment   Specimen Description BLOOD UNKNOWN   Final    Special Requests BOTTLES DRAWN AEROBIC AND ANAEROBIC 5CC EACH   Final    Culture  Setup Time 11/24/2012 04:40   Final    Culture     Final    Value: LACTOBACILLUS SPECIES     Note: Standardized susceptibility testing for this organism is not available.     5 Note: Gram Stain Report Called to,Read Back By and Verified With: JENNY BURNS 12 13 7:25PM BY THOMI   Report Status 11/27/2012 FINAL   Final   CULTURE, BLOOD (ROUTINE X 2)     Status: Normal (Preliminary result)   Collection Time   11/24/12 12:30 AM      Component Value Range Status Comment   Specimen Description BLOOD UNKNOWN   Final    Special Requests BOTTLES DRAWN AEROBIC AND ANAEROBIC 5 CC EACH   Final    Culture  Setup Time 11/24/2012 04:40   Final    Culture  Final    Value:        BLOOD CULTURE RECEIVED NO GROWTH TO DATE CULTURE WILL BE HELD FOR 5 DAYS BEFORE ISSUING A FINAL NEGATIVE REPORT   Report Status PENDING   Incomplete   CULTURE, BLOOD (ROUTINE X 2)     Status: Normal (Preliminary result)   Collection Time   11/24/12  3:00 PM      Component Value Range Status Comment   Specimen Description BLOOD RIGHT ARM   Final    Special Requests BOTTLES DRAWN AEROBIC AND ANAEROBIC 10CC   Final    Culture  Setup Time 11/24/2012 22:46   Final    Culture     Final    Value:        BLOOD CULTURE RECEIVED NO GROWTH TO DATE CULTURE WILL BE HELD FOR 5 DAYS BEFORE ISSUING A FINAL NEGATIVE REPORT   Report Status PENDING   Incomplete   CLOSTRIDIUM DIFFICILE BY PCR     Status: Normal   Collection Time   11/27/12 11:59 AM      Component Value Range  Status Comment   C difficile by pcr NEGATIVE  NEGATIVE Final      Studies/Results: No results found.  Medications: Scheduled Meds:    . feeding supplement  237 mL Oral BID BM  . finasteride  5 mg Oral Daily  . fluconazole  50 mg Oral Daily  . gemfibrozil  600 mg Oral BID AC  . imipenem-cilastatin  250 mg Intravenous Q6H  . metoprolol  25 mg Oral BID  . mirtazapine  30 mg Oral QHS  . potassium chloride  40 mEq Oral Once  . saccharomyces boulardii  250 mg Oral BID  . sodium chloride  3 mL Intravenous Q12H  . vancomycin  750 mg Intravenous Q24H   Continuous Infusions:    . sodium chloride 50 mL/hr at 11/26/12 1430   PRN Meds:.acetaminophen, acetaminophen, HYDROcodone-acetaminophen, LORazepam, morphine injection, ondansetron (ZOFRAN) IV, ondansetron    LOS: 5 days   Basya Casavant,CHRISTOPHER  [facility] Pager [phone]. If 8PM-8AM, please contact night-coverage at [email or web address], password [facility] 11/28/2012, 1:57 PM  LOS: 5 days

## 2012-11-29 DIAGNOSIS — C801 Malignant (primary) neoplasm, unspecified: Secondary | ICD-10-CM

## 2012-11-29 LAB — CBC
Hemoglobin: 9.9 g/dL — ABNORMAL LOW (ref 13.0–17.0)
MCV: 92.6 fL (ref 78.0–100.0)
Platelets: 288 10*3/uL (ref 150–400)
RBC: 3.23 MIL/uL — ABNORMAL LOW (ref 4.22–5.81)
WBC: 7.1 10*3/uL (ref 4.0–10.5)

## 2012-11-29 LAB — BASIC METABOLIC PANEL
CO2: 24 mEq/L (ref 19–32)
Chloride: 104 mEq/L (ref 96–112)
Glucose, Bld: 97 mg/dL (ref 70–99)
Sodium: 136 mEq/L (ref 135–145)

## 2012-11-29 LAB — URINE CULTURE: Colony Count: >=100000

## 2012-11-29 MED ORDER — FLUCONAZOLE 50 MG PO TABS: 50.0000 mg | ORAL_TABLET | Freq: Every day | ORAL | Status: AC

## 2012-11-29 MED ORDER — LOPERAMIDE HCL 2 MG PO CAPS: 2.0000 mg | ORAL_CAPSULE | Freq: Four times a day (QID) | ORAL | Status: DC | PRN

## 2012-11-29 MED ORDER — DEXTROSE 5 % IV SOLN
1.0000 g | Freq: Two times a day (BID) | INTRAVENOUS | Status: DC
  Administered 2012-11-29: 1 g via INTRAVENOUS
  Filled 2012-11-29 (×2): qty 1

## 2012-11-29 MED ORDER — DEXTROSE 5 % IV SOLN: 1.0000 g | Freq: Two times a day (BID) | INTRAVENOUS | Status: DC

## 2012-11-29 NOTE — Progress Notes (Signed)
Patient is set to discharge to [facility] today. Daughter, Eunice Blase aware & completing admission paperwork at the facility now, son, Annette Stable is at bedside. [facility] called for transport.   Clinical Social Work Department CLINICAL SOCIAL WORK PLACEMENT NOTE 11/29/2012  Patient:  Brent Day, Brent Day  Account Number:  [id number] Admit date:  11/23/2012  Clinical Social Worker:  Orpah Greek  Date/time:  11/29/2012 10:55 AM  Clinical Social Work is seeking post-discharge placement for this patient at the following level of care:   SKILLED NURSING   (*CSW will update this form in Epic as items are completed)   11/29/2012  Patient/family provided with [facility] Department of Clinical Social Work's list of facilities offering this level of care within the geographic area requested by the patient (or if unable, by the patient's family).  11/29/2012  Patient/family informed of their freedom to choose among providers that offer the needed level of care, that participate in Medicare, Medicaid or managed care program needed by the patient, have an available bed and are willing to accept the patient.  11/29/2012  Patient/family informed of [facility]' ownership interest in [facility], as well as of the fact that they are under no obligation to receive care at this facility.  PASARR submitted to EDS on 11/29/2012 PASARR number received from EDS on 11/29/2012  FL2 transmitted to all facilities in geographic area requested by pt/family on  11/29/2012 FL2 transmitted to all facilities within larger geographic area on   Patient informed that his/her managed care company has contracts with or will negotiate with  certain facilities, including the following:     Patient/family informed of bed offers received:  11/29/2012 Patient chooses bed at [facility] Physician recommends and patient chooses bed at    Patient to be transferred to [facility] on  11/29/2012 Patient to be transferred to facility by [facility]  The following physician request were entered in Epic:   Additional Comments:  [name], LCSW [facility] Clinical Social Worker cell #: [phone]

## 2012-11-29 NOTE — Progress Notes (Signed)
Clinical Social Work Department CLINICAL SOCIAL WORK PLACEMENT NOTE 11/29/2012  Patient:  TAMOTSU, WIEDERHOLT  Account Number:  [id number] Admit date:  11/23/2012  Clinical Social Worker:  Orpah Greek  Date/time:  11/29/2012 10:55 AM  Clinical Social Work is seeking post-discharge placement for this patient at the following level of care:   SKILLED NURSING   (*CSW will update this form in Epic as items are completed)   11/29/2012  Patient/family provided with [facility] Department of Clinical Social Work's list of facilities offering this level of care within the geographic area requested by the patient (or if unable, by the patient's family).  11/29/2012  Patient/family informed of their freedom to choose among providers that offer the needed level of care, that participate in Medicare, Medicaid or managed care program needed by the patient, have an available bed and are willing to accept the patient.  11/29/2012  Patient/family informed of [facility]' ownership interest in [facility], as well as of the fact that they are under no obligation to receive care at this facility.  PASARR submitted to EDS on 11/29/2012 PASARR number received from EDS on 11/29/2012  FL2 transmitted to all facilities in geographic area requested by pt/family on  11/29/2012 FL2 transmitted to all facilities within larger geographic area on   Patient informed that his/her managed care company has contracts with or will negotiate with  certain facilities, including the following:     Patient/family informed of bed offers received:   Patient chooses bed at  Physician recommends and patient chooses bed at    Patient to be transferred to  on   Patient to be transferred to facility by   The following physician request were entered in Epic:   Additional Comments:  [name], LCSW [facility] Clinical Social Worker cell #: [phone]

## 2012-11-29 NOTE — Evaluation (Addendum)
Physical Therapy Evaluation Patient Details Name: Brent Day. MRN: [id number] DOB: [birth year] Today's Date: 11/29/2012 Time: 0912-0940 PT Time Calculation (min): 28 min  PT Assessment / Plan / Recommendation Clinical Impression  Pt. was admitted 12/04 for 5 days of weakness, falls fever, SOB. Has chronic indwelling catheter.. Pt found to have pneumonia and UTI. Pt. reports he lives with son, has 24 hour caregivers. Pt is weak and unsteady. Pt. will benefit from PT while in acute care and for HHPT. nop famly presebnt to discuss DC plan.    PT Assessment  Patient needs continued PT services    Follow Up Recommendations  SNF    Does the patient have the potential to tolerate intense rehabilitation      Barriers to Discharge        Equipment Recommendations  None recommended by PT    Recommendations for Other Services     Frequency Min 3X/week    Precautions / Restrictions Precautions Precautions: Fall Restrictions Weight Bearing Restrictions: No   Pertinent Vitals/Pain Mild dyspnea 2-3/4 sats .90% RA      Mobility  Bed Mobility Bed Mobility: Supine to Sit Supine to Sit: 3: Mod assist Details for Bed Mobility Assistance: min cues Transfers Sit to Stand: 4: Min assist Ambulation/Gait Ambulation/Gait Assistance: 4: Min assist Ambulation Distance (Feet): 125 Feet Assistive device: Rolling walker Gait Pattern: Decreased stride length;Decreased trunk rotation;Trunk flexed General Gait Details: Pt. with slight decreased balance to turn around using RW    Shoulder Instructions     Exercises     PT Diagnosis: Difficulty walking;Generalized weakness  PT Problem List: Decreased strength;Decreased activity tolerance;Decreased balance;Decreased mobility;Decreased knowledge of use of DME;Decreased safety awareness;Decreased knowledge of precautions PT Treatment Interventions: DME instruction;Gait training;Stair training;Functional mobility training;Therapeutic  activities;Therapeutic exercise;Patient/family education   PT Goals Acute Rehab PT Goals PT Goal Formulation: With patient Time For Goal Achievement: 12/13/12 Potential to Achieve Goals: Good Pt will go Supine/Side to Sit: Independently PT Goal: Supine/Side to Sit - Progress: Goal set today Pt will go Sit to Supine/Side: Independently PT Goal: Sit to Supine/Side - Progress: Goal set today Pt will go Sit to Stand: with supervision PT Goal: Sit to Stand - Progress: Goal set today Pt will go Stand to Sit: with modified independence PT Goal: Stand to Sit - Progress: Goal set today Pt will Ambulate: >150 feet;with supervision;with least restrictive assistive device PT Goal: Ambulate - Progress: Goal set today  Pt will Perform Home Exercise Program: with supervision, verbal cues required/provided PT Goal: Perform Home Exercise Program - Progress: Goal set today  Visit Information  Last PT Received On: 11/29/12 Assistance Needed: +1    Subjective Data      Prior Functioning  Home Living Lives With: Family Type of Home: House Home Access: Stairs to enter Secretary/administrator of Steps: 1 Home Layout: One level Bathroom Shower/Tub: Engineer, manufacturing systems: Standard Home Adaptive Equipment: Walker - rolling;Shower chair with back;Bedside commode/3-in-1 Prior Function Level of Independence: Independent with assistive device(s) Driving: Yes Communication Communication: No difficulties    Cognition  Overall Cognitive Status: Impaired Area of Impairment: Memory Arousal/Alertness: Awake/alert Orientation Level: Oriented X4 / Intact Behavior During Session: WFL for tasks performed Cognition - Other Comments: decreased recall of who called several minutes before.  Afraid he'd forget to order lunch    Extremity/Trunk Assessment Right Upper Extremity Assessment RUE ROM/Strength/Tone: Within functional levels Left Upper Extremity Assessment LUE ROM/Strength/Tone: Within  functional levels Right Lower Extremity Assessment RLE ROM/Strength/Tone: [facility] for  tasks assessed RLE Sensation: WFL - Light Touch Left Lower Extremity Assessment LLE ROM/Strength/Tone: WFL for tasks assessed LLE Sensation: WFL - Light Touch Trunk Assessment Trunk Assessment: Kyphotic   Balance Static Sitting Balance Static Sitting - Balance Support: Feet supported Static Sitting - Level of Assistance: 7: Independent Static Standing Balance Static Standing - Level of Assistance: 5: Stand by assistance  End of Session PT - End of Session Activity Tolerance: Patient limited by fatigue Patient left: in chair;with call bell/phone within reach;with chair alarm set Nurse Communication: Mobility status  GP     Rada Hay 11/29/2012, 11:31 AM  [phone]

## 2012-11-29 NOTE — Evaluation (Signed)
Occupational Therapy Evaluation Patient Details Name: Brent Day. MRN: [id number] DOB: February 16, 1927 Today's Date: 11/29/2012 Time: 0914-     OT Assessment / Plan / Recommendation Clinical Impression  This 78 year old man was admitted with weakness from dehydration or infection.  He has a h/o prostate CA and lives with son.  At baseline, he is mod I for adls.  Pt would benefit from skilled OT to increase independence with adls to reach a min A level in acute.    OT Assessment  Patient needs continued OT Services    Follow Up Recommendations  SNF; if home, Supervision/Assistance - 24 hour    Barriers to Discharge      Equipment Recommendations  None recommended by OT    Recommendations for Other Services    Frequency  Min 2X/week    Precautions / Restrictions Precautions Precautions: Fall Restrictions Weight Bearing Restrictions: No   Pertinent Vitals/Pain No pain.  Dyspnea 2/4. Sats 94% on RA    ADL  Grooming: Performed;Set up;Brushing hair Where Assessed - Grooming: Unsupported sitting Upper Body Bathing: Simulated;Set up Where Assessed - Upper Body Bathing: Unsupported sitting Lower Body Bathing: Simulated;Moderate assistance Where Assessed - Lower Body Bathing: Supported sit to stand Upper Body Dressing: Simulated;Minimal assistance (iv) Where Assessed - Upper Body Dressing: Unsupported sitting Lower Body Dressing: Simulated;Maximal assistance Where Assessed - Lower Body Dressing: Supported sit to stand Toilet Transfer: Performed;Minimal assistance Toilet Transfer Method: Sit to Barista: Raised toilet seat with arms (or 3-in-1 over toilet) Toileting - Clothing Manipulation and Hygiene: Simulated;Minimal assistance Where Assessed - Toileting Clothing Manipulation and Hygiene: Sit to stand from 3-in-1 or toilet Transfers/Ambulation Related to ADLs: ambulated to bathroom then in hall with PT.  Overall min A, min cues ADL Comments: Pt  usually mod I with adls:  difficulty reaching lower legs today.      OT Diagnosis: Generalized weakness  OT Problem List: Decreased strength;Decreased activity tolerance;Impaired balance (sitting and/or standing);Decreased knowledge of use of DME or AE;Decreased cognition OT Treatment Interventions: Self-care/ADL training;Energy conservation;Therapeutic activities;Cognitive remediation/compensation;Patient/family education;Balance training   OT Goals Acute Rehab OT Goals OT Goal Formulation: With patient Time For Goal Achievement: 12/13/12 Potential to Achieve Goals: Good ADL Goals Pt Will Perform Grooming: with supervision;Standing at sink ADL Goal: Grooming - Progress: Goal set today Pt Will Perform Lower Body Bathing: with min assist;Sit to stand from bed ADL Goal: Lower Body Bathing - Progress: Goal set today Pt Will Perform Lower Body Dressing: with min assist;Sit to stand from chair ADL Goal: Lower Body Dressing - Progress: Goal set today Pt Will Transfer to Toilet: with supervision;Ambulation;3-in-1 (and complete all aspects of toileting) ADL Goal: Toilet Transfer - Progress: Goal set today Pt Will Perform Tub/Shower Transfer: Shower transfer;with min assist;Ambulation;Shower seat with back (min guard) ADL Goal: Tub/Shower Transfer - Progress: Goal set today Miscellaneous OT Goals Miscellaneous OT Goal #1: Pt will initiate rest break for energy conservation when dyspnea present, without cues OT Goal: Miscellaneous Goal #1 - Progress: Goal set today  Visit Information  Last OT Received On: 11/29/12 PT/OT Co-Evaluation/Treatment: Yes    Subjective Data      Prior Functioning     Home Living Lives With: Son Type of Home: House Home Access: Stairs to enter [employer] of Steps: 1 Home Layout: One level Bathroom Shower/Tub: Engineer, manufacturing systems: Standard Home Adaptive Equipment: Walker - rolling;Shower chair with back;Bedside commode/3-in-1 Prior  Function Level of Independence: Independent with assistive device(s) Driving: Yes  Communication Communication: No difficulties         Vision/Perception     Cognition  Overall Cognitive Status: Impaired Area of Impairment: Memory Arousal/Alertness: Awake/alert Orientation Level: Oriented X4 / Intact Behavior During Session: WFL for tasks performed Cognition - Other Comments: decreased recall of who called several minutes before.  Afraid he'd forget to order lunch    Extremity/Trunk Assessment Right Upper Extremity Assessment RUE ROM/Strength/Tone: Within functional levels Left Upper Extremity Assessment LUE ROM/Strength/Tone: Within functional levels Trunk Assessment Trunk Assessment: Kyphotic     Mobility Bed Mobility Bed Mobility: Supine to Sit Supine to Sit: 3: Mod assist Details for Bed Mobility Assistance: min cues Transfers Transfers: Sit to Stand Sit to Stand: 4: Min assist     Shoulder Instructions     Exercise     Balance     End of Session OT - End of Session Activity Tolerance: Patient tolerated treatment well Patient left: in chair;with call bell/phone within reach  GO     [employer] 11/29/2012, 10:58 AM Marica Otter, OTR/L [phone] 11/29/2012

## 2012-11-29 NOTE — Progress Notes (Signed)
Clinical Social Work Department BRIEF PSYCHOSOCIAL ASSESSMENT 11/29/2012  Patient:  Brent Day, Brent Day     Account Number:  [id number]     Admit date:  11/23/2012  Clinical Social Worker:  Orpah Greek  Date/Time:  11/29/2012 10:51 AM  Referred by:  Physician  Date Referred:  11/29/2012 Referred for  SNF Placement   Other Referral:   Interview type:  Patient Other interview type:   and daughter    PSYCHOSOCIAL DATA Living Status:  FAMILY Admitted from facility:   Level of care:   Primary support name:  Brent Day (daughter) c#: [phone] Primary support relationship to patient:  CHILD, ADULT Degree of support available:   good    CURRENT CONCERNS Current Concerns  Post-Acute Placement   Other Concerns:    SOCIAL WORK ASSESSMENT / PLAN CSW spoke with patient/daughter, Brent Day via phone re: discharge planning. Patient was admitted from home with daughter but per PT recommendation - will need SNF at discharge.   Assessment/plan status:  Information/Referral to [facility] Other assessment/ plan:   Information/referral to community resources:   CSW completed FL2 and faxed information out & left message for [facility] @ [employer] (daughter's first choice). Awaiting call back from [facility] re: bed offer/availability.    PATIENT'S/FAMILY'S RESPONSE TO PLAN OF CARE: Patient is agreeable to SNF - states that over the past 3 months he feels he has become increasingly weaker.        [name], LCSW [facility] Clinical Social Worker cell #: [phone]

## 2012-11-30 LAB — CULTURE, BLOOD (ROUTINE X 2)
Culture: NO GROWTH
Culture: NO GROWTH

## 2012-12-23 ENCOUNTER — Other Ambulatory Visit: Payer: Self-pay | Admitting: Urology

## 2013-01-06 ENCOUNTER — Encounter (HOSPITAL_COMMUNITY): Payer: Self-pay | Admitting: Pharmacy Technician

## 2013-01-09 ENCOUNTER — Encounter (HOSPITAL_COMMUNITY): Payer: Self-pay

## 2013-01-09 ENCOUNTER — Encounter (HOSPITAL_COMMUNITY)
Admission: RE | Admit: 2013-01-09 | Discharge: 2013-01-09 | Disposition: A | Discharge status: Home / Self Care | Payer: Medicare Other | Source: Ambulatory Visit | Attending: Urology | Admitting: Urology

## 2013-01-09 HISTORY — DX: Stress fracture, unspecified site, initial encounter for fracture: M84.30XA

## 2013-01-09 HISTORY — DX: Anxiety disorder, unspecified: F41.9

## 2013-01-09 HISTORY — DX: Presence of other specified devices: Z97.8

## 2013-01-09 HISTORY — DX: Other specified disorders of bone density and structure, unspecified site: M85.80

## 2013-01-09 HISTORY — DX: Psychotic disorder with hallucinations due to known physiological condition: F06.0

## 2013-01-09 HISTORY — DX: Presence of urogenital implants: Z96.0

## 2013-01-09 HISTORY — DX: Cardiac arrhythmia, unspecified: I49.9

## 2013-01-09 HISTORY — DX: Hallucinations, unspecified: R44.3

## 2013-01-09 HISTORY — DX: Non-pressure chronic ulcer of unspecified ankle with unspecified severity: L97.309

## 2013-01-09 LAB — BASIC METABOLIC PANEL
CO2: 25 mEq/L (ref 19–32)
Glucose, Bld: 101 mg/dL — ABNORMAL HIGH (ref 70–99)
Potassium: 4.7 mEq/L (ref 3.5–5.1)
Sodium: 137 mEq/L (ref 135–145)

## 2013-01-09 LAB — CBC
Hemoglobin: 11.9 g/dL — ABNORMAL LOW (ref 13.0–17.0)
MCH: 30.6 pg (ref 26.0–34.0)
RBC: 3.89 MIL/uL — ABNORMAL LOW (ref 4.22–5.81)

## 2013-01-09 LAB — SURGICAL PCR SCREEN: MRSA, PCR: POSITIVE — AB

## 2013-01-09 NOTE — Patient Instructions (Addendum)
Brent Odea Jr.  01/09/2013                           YOUR PROCEDURE IS SCHEDULED ON:  01/18/13               PLEASE REPORT TO SHORT STAY CENTER AT :  8:00 AM               CALL THIS NUMBER IF ANY PROBLEMS THE DAY OF SURGERY :               [phone]                      REMEMBER:   Do not eat food or drink liquids AFTER MIDNIGHT   Take these medicines the morning of surgery with A SIP OF WATER:  METOPROLOL / ATIVAN IF NEEDED   Do not wear jewelry, make-up   Do not wear lotions, powders, or perfumes.   Do not shave legs or underarms 12 hrs. before surgery (men may shave face)  Do not bring valuables to the hospital.  Contacts, dentures or bridgework may not be worn into surgery.  Leave suitcase in the car. After surgery it may be brought to your room.  For patients admitted to the hospital more than one night, checkout time is 11:00                          The day of discharge.   Patients discharged the day of surgery will not be allowed to drive home                             If going home same day of surgery, must have someone stay with you first                           24 hrs at home and arrange for some one to drive you home from hospital.    Special Instructions:   Please read over the following fact sheets that you were given:               1. MRSA  INFORMATION                      2. [place] PREPARING FOR SURGERY SHEET                                                X_____________________________________________________________________        Failure to follow these instructions may result in cancellation of your surgery

## 2013-01-09 NOTE — Progress Notes (Signed)
01/09/13 1456  OBSTRUCTIVE SLEEP APNEA  Have you ever been diagnosed with sleep apnea through a sleep study? No  Do you snore loudly (loud enough to be heard through closed doors)?  0  Do you often feel tired, fatigued, or sleepy during the daytime? 1  Has anyone observed you stop breathing during your sleep? 1  Do you have, or are you being treated for high blood pressure? 1  BMI more than 35 kg/m2? 0  Age over 77 years old? 1  Neck circumference greater than 40 cm/18 inches? 0  Gender: 1  Obstructive Sleep Apnea Score 5   Score 4 or greater  Results sent to PCP

## 2013-01-10 ENCOUNTER — Other Ambulatory Visit: Payer: Self-pay | Admitting: Urology

## 2013-01-12 NOTE — Progress Notes (Signed)
Dr. Margarita Grizzle: we need to have an OP permit put into to EPIC orders when you can please. Surgery is 01/18/13 Thank you.

## 2013-01-17 MED ORDER — TOBRAMYCIN SULFATE 80 MG/2ML IJ SOLN
60.0000 mg | Freq: Once | INTRAVENOUS | Status: AC
  Administered 2013-01-18: 60 mg via INTRAVENOUS
  Filled 2013-01-17: qty 1.5

## 2013-01-17 MED ORDER — PIPERACILLIN-TAZOBACTAM 3.375 G IVPB 30 MIN
3.3750 g | Freq: Once | INTRAVENOUS | Status: AC
  Administered 2013-01-18: 3.375 g via INTRAVENOUS
  Filled 2013-01-17: qty 50

## 2013-01-18 ENCOUNTER — Encounter (HOSPITAL_COMMUNITY): Payer: Self-pay | Admitting: Anesthesiology

## 2013-01-18 ENCOUNTER — Encounter (HOSPITAL_COMMUNITY): Payer: Self-pay | Admitting: *Deleted

## 2013-01-18 ENCOUNTER — Ambulatory Visit (HOSPITAL_COMMUNITY): Payer: Medicare Other | Admitting: Anesthesiology

## 2013-01-18 ENCOUNTER — Ambulatory Visit (HOSPITAL_COMMUNITY)
Admission: RE | Admit: 2013-01-18 | Discharge: 2013-01-19 | Disposition: A | Discharge status: Home / Self Care | Payer: Medicare Other | Source: Ambulatory Visit | Attending: Urology | Admitting: Urology

## 2013-01-18 ENCOUNTER — Encounter (HOSPITAL_COMMUNITY)
Admission: RE | Disposition: A | Discharge status: Home / Self Care | Payer: Self-pay | Source: Ambulatory Visit | Attending: Urology

## 2013-01-18 DIAGNOSIS — N320 Bladder-neck obstruction: Secondary | ICD-10-CM

## 2013-01-18 DIAGNOSIS — Z79899 Other long term (current) drug therapy: Secondary | ICD-10-CM | POA: Insufficient documentation

## 2013-01-18 DIAGNOSIS — I10 Essential (primary) hypertension: Secondary | ICD-10-CM | POA: Insufficient documentation

## 2013-01-18 DIAGNOSIS — C61 Malignant neoplasm of prostate: Secondary | ICD-10-CM | POA: Insufficient documentation

## 2013-01-18 DIAGNOSIS — F17200 Nicotine dependence, unspecified, uncomplicated: Secondary | ICD-10-CM | POA: Insufficient documentation

## 2013-01-18 DIAGNOSIS — Z01812 Encounter for preprocedural laboratory examination: Secondary | ICD-10-CM | POA: Insufficient documentation

## 2013-01-18 HISTORY — PX: CYSTOSCOPY: SHX5120

## 2013-01-18 HISTORY — PX: INSERTION OF SUPRAPUBIC CATHETER: SHX5870

## 2013-01-18 HISTORY — PX: TRANSURETHRAL RESECTION OF PROSTATE: SHX73

## 2013-01-18 LAB — BASIC METABOLIC PANEL
BUN: 25 mg/dL — ABNORMAL HIGH (ref 6–23)
Chloride: 102 mEq/L (ref 96–112)
GFR calc Af Amer: 57 mL/min — ABNORMAL LOW (ref >90)
GFR calc non Af Amer: 49 mL/min — ABNORMAL LOW (ref >90)
Potassium: 4.4 mEq/L (ref 3.5–5.1)
Sodium: 135 mEq/L (ref 135–145)

## 2013-01-18 SURGERY — TRANSURETHRAL RESECTION OF THE PROSTATE WITH GYRUS INSTRUMENTS
Anesthesia: General | Wound class: Clean Contaminated

## 2013-01-18 MED ORDER — LIDOCAINE HCL 2 % EX GEL
CUTANEOUS | Status: AC
  Filled 2013-01-18: qty 10

## 2013-01-18 MED ORDER — ACETAMINOPHEN 10 MG/ML IV SOLN
1000.0000 mg | Freq: Four times a day (QID) | INTRAVENOUS | Status: AC
  Administered 2013-01-18 – 2013-01-19 (×3): 1000 mg via INTRAVENOUS
  Filled 2013-01-18 (×4): qty 100

## 2013-01-18 MED ORDER — FENTANYL CITRATE 0.05 MG/ML IJ SOLN: 25.0000 ug | INTRAMUSCULAR | Status: DC | PRN

## 2013-01-18 MED ORDER — ONDANSETRON HCL 4 MG/2ML IJ SOLN
INTRAMUSCULAR | Status: DC | PRN
  Administered 2013-01-18: 4 mg via INTRAVENOUS

## 2013-01-18 MED ORDER — DEXAMETHASONE SODIUM PHOSPHATE 4 MG/ML IJ SOLN
INTRAMUSCULAR | Status: DC | PRN
  Administered 2013-01-18: 10 mg via INTRAVENOUS

## 2013-01-18 MED ORDER — FENTANYL CITRATE 0.05 MG/ML IJ SOLN
INTRAMUSCULAR | Status: DC | PRN
  Administered 2013-01-18: 100 ug via INTRAVENOUS
  Administered 2013-01-18: 25 ug via INTRAVENOUS
  Administered 2013-01-18: 50 ug via INTRAVENOUS
  Administered 2013-01-18: 25 ug via INTRAVENOUS
  Administered 2013-01-18: 50 ug via INTRAVENOUS
  Administered 2013-01-18: 25 ug via INTRAVENOUS
  Administered 2013-01-18 (×3): 50 ug via INTRAVENOUS
  Administered 2013-01-18: 25 ug via INTRAVENOUS
  Administered 2013-01-18: 50 ug via INTRAVENOUS

## 2013-01-18 MED ORDER — IOHEXOL 300 MG/ML  SOLN
INTRAMUSCULAR | Status: AC
  Filled 2013-01-18: qty 1

## 2013-01-18 MED ORDER — MUPIROCIN 2 % EX OINT
TOPICAL_OINTMENT | Freq: Two times a day (BID) | CUTANEOUS | Status: DC
  Administered 2013-01-18: 1 via NASAL
  Administered 2013-01-19: 10:00:00 via NASAL
  Filled 2013-01-18: qty 22

## 2013-01-18 MED ORDER — SACCHAROMYCES BOULARDII 250 MG PO CAPS
250.0000 mg | ORAL_CAPSULE | Freq: Two times a day (BID) | ORAL | Status: DC
  Administered 2013-01-18 – 2013-01-19 (×2): 250 mg via ORAL
  Filled 2013-01-18 (×3): qty 1

## 2013-01-18 MED ORDER — PHENYLEPHRINE HCL 10 MG/ML IJ SOLN
INTRAMUSCULAR | Status: DC | PRN
  Administered 2013-01-18 (×2): 120 ug via INTRAVENOUS
  Administered 2013-01-18: 80 ug via INTRAVENOUS

## 2013-01-18 MED ORDER — SODIUM CHLORIDE 0.9 % IJ SOLN: 3.0000 mL | Freq: Two times a day (BID) | INTRAMUSCULAR | Status: DC

## 2013-01-18 MED ORDER — GEMFIBROZIL 600 MG PO TABS
600.0000 mg | ORAL_TABLET | Freq: Two times a day (BID) | ORAL | Status: DC
  Administered 2013-01-18 – 2013-01-19 (×2): 600 mg via ORAL
  Filled 2013-01-18 (×4): qty 1

## 2013-01-18 MED ORDER — SODIUM CHLORIDE 0.9 % IV SOLN
INTRAVENOUS | Status: DC
  Administered 2013-01-18: 50 mL/h via INTRAVENOUS

## 2013-01-18 MED ORDER — METOPROLOL TARTRATE 25 MG PO TABS
25.0000 mg | ORAL_TABLET | Freq: Two times a day (BID) | ORAL | Status: DC
  Administered 2013-01-18 – 2013-01-19 (×2): 25 mg via ORAL
  Filled 2013-01-18 (×3): qty 1

## 2013-01-18 MED ORDER — SENNOSIDES-DOCUSATE SODIUM 8.6-50 MG PO TABS
1.0000 | ORAL_TABLET | Freq: Two times a day (BID) | ORAL | Status: DC
  Administered 2013-01-18 – 2013-01-19 (×2): 1 via ORAL
  Filled 2013-01-18 (×3): qty 1

## 2013-01-18 MED ORDER — BIOTENE DRY MOUTH MT LIQD
15.0000 mL | Freq: Two times a day (BID) | OROMUCOSAL | Status: DC
  Administered 2013-01-18 – 2013-01-19 (×2): 15 mL via OROMUCOSAL

## 2013-01-18 MED ORDER — BACITRACIN-NEOMYCIN-POLYMYXIN 400-5-5000 EX OINT: 1.0000 "application " | TOPICAL_OINTMENT | Freq: Three times a day (TID) | CUTANEOUS | Status: DC | PRN

## 2013-01-18 MED ORDER — SODIUM CHLORIDE 0.9 % IJ SOLN: 3.0000 mL | INTRAMUSCULAR | Status: DC | PRN

## 2013-01-18 MED ORDER — LACTATED RINGERS IV SOLN
INTRAVENOUS | Status: DC
  Administered 2013-01-18: 1000 mL via INTRAVENOUS

## 2013-01-18 MED ORDER — PROPOFOL 10 MG/ML IV BOLUS
INTRAVENOUS | Status: DC | PRN
  Administered 2013-01-18: 150 mg via INTRAVENOUS

## 2013-01-18 MED ORDER — PHENYLEPHRINE HCL 10 MG/ML IJ SOLN
10.0000 mg | INTRAVENOUS | Status: DC | PRN
  Administered 2013-01-18: 20 ug/min via INTRAVENOUS

## 2013-01-18 MED ORDER — ACETAMINOPHEN 325 MG PO TABS: 650.0000 mg | ORAL_TABLET | ORAL | Status: DC | PRN

## 2013-01-18 MED ORDER — LIDOCAINE HCL 2 % EX GEL
CUTANEOUS | Status: DC | PRN
  Administered 2013-01-18: 1 via URETHRAL

## 2013-01-18 MED ORDER — LORAZEPAM 1 MG PO TABS
1.0000 mg | ORAL_TABLET | Freq: Three times a day (TID) | ORAL | Status: DC | PRN
  Administered 2013-01-18: 1 mg via ORAL
  Filled 2013-01-18: qty 1

## 2013-01-18 MED ORDER — BELLADONNA ALKALOIDS-OPIUM 16.2-60 MG RE SUPP: 1.0000 | Freq: Four times a day (QID) | RECTAL | Status: DC | PRN

## 2013-01-18 MED ORDER — MIRTAZAPINE 30 MG PO TABS
30.0000 mg | ORAL_TABLET | Freq: Every day | ORAL | Status: DC
  Administered 2013-01-18: 30 mg via ORAL
  Filled 2013-01-18 (×2): qty 1

## 2013-01-18 MED ORDER — INDIGOTINDISULFONATE SODIUM 8 MG/ML IJ SOLN
INTRAMUSCULAR | Status: AC
  Filled 2013-01-18: qty 5

## 2013-01-18 MED ORDER — PROMETHAZINE HCL 25 MG/ML IJ SOLN: 6.2500 mg | INTRAMUSCULAR | Status: DC | PRN

## 2013-01-18 MED ORDER — OXYCODONE HCL 5 MG PO TABS
5.0000 mg | ORAL_TABLET | ORAL | Status: DC | PRN
  Administered 2013-01-19: 5 mg via ORAL
  Filled 2013-01-18: qty 1

## 2013-01-18 MED ORDER — PIPERACILLIN-TAZOBACTAM 3.375 G IVPB
3.3750 g | Freq: Three times a day (TID) | INTRAVENOUS | Status: DC
  Administered 2013-01-18 – 2013-01-19 (×3): 3.375 g via INTRAVENOUS
  Filled 2013-01-18 (×4): qty 50

## 2013-01-18 MED ORDER — ONDANSETRON HCL 4 MG/2ML IJ SOLN: 4.0000 mg | INTRAMUSCULAR | Status: DC | PRN

## 2013-01-18 MED ORDER — BELLADONNA ALKALOIDS-OPIUM 16.2-60 MG RE SUPP
RECTAL | Status: AC
  Filled 2013-01-18: qty 1

## 2013-01-18 MED ORDER — SODIUM CHLORIDE 0.9 % IV SOLN: 250.0000 mL | INTRAVENOUS | Status: DC | PRN

## 2013-01-18 MED ORDER — SODIUM CHLORIDE 0.9 % IR SOLN
Status: DC | PRN
  Administered 2013-01-18: 9000 mL

## 2013-01-18 MED ORDER — ENSURE COMPLETE PO LIQD
237.0000 mL | Freq: Two times a day (BID) | ORAL | Status: DC
  Administered 2013-01-18 – 2013-01-19 (×2): 237 mL via ORAL

## 2013-01-18 MED ORDER — METOCLOPRAMIDE HCL 5 MG/ML IJ SOLN
INTRAMUSCULAR | Status: DC | PRN
  Administered 2013-01-18: 10 mg via INTRAVENOUS

## 2013-01-18 MED ORDER — TOBRAMYCIN SULFATE 80 MG/2ML IJ SOLN
Freq: Once | Status: AC
  Administered 2013-01-18: 12:00:00
  Filled 2013-01-18: qty 1000

## 2013-01-18 MED ORDER — ENSURE PLUS PO LIQD
237.0000 mL | Freq: Two times a day (BID) | ORAL | Status: DC
  Filled 2013-01-18: qty 237

## 2013-01-18 MED ORDER — BELLADONNA ALKALOIDS-OPIUM 16.2-60 MG RE SUPP
RECTAL | Status: DC | PRN
  Administered 2013-01-18: 1 via RECTAL

## 2013-01-18 MED ORDER — FINASTERIDE 5 MG PO TABS
5.0000 mg | ORAL_TABLET | Freq: Every evening | ORAL | Status: DC
  Administered 2013-01-18: 5 mg via ORAL
  Filled 2013-01-18 (×2): qty 1

## 2013-01-18 MED ORDER — DEXAMETHASONE SODIUM PHOSPHATE 4 MG/ML IJ SOLN: INTRAMUSCULAR | Status: DC | PRN

## 2013-01-18 SURGICAL SUPPLY — 47 items
BAG URINE DRAINAGE (UROLOGICAL SUPPLIES) ×2 IMPLANT
BAG URINE LEG 500ML (DRAIN) ×1 IMPLANT
BAG URO CATCHER STRL LF (DRAPE) ×2 IMPLANT
BLADE SURG 15 STRL LF DISP TIS (BLADE) ×1 IMPLANT
BLADE SURG 15 STRL SS (BLADE) ×2
CATH COUDE 24FR 5CC (CATHETERS) IMPLANT
CATH FOLEY 2W COUNCIL 5CC 18FR (CATHETERS) ×1 IMPLANT
CATH RIBBED COUDE  30CC (CATHETERS)
CATH RIBBED COUDE 30CC (CATHETERS)
CLOTH BEACON ORANGE TIMEOUT ST (SAFETY) ×3 IMPLANT
COVER SURGICAL LIGHT HANDLE (MISCELLANEOUS) ×2 IMPLANT
DRAPE CAMERA CLOSED 9X96 (DRAPES) ×2 IMPLANT
ELECT LOOP MED HF 24F 12D (CUTTING LOOP) ×1 IMPLANT
ELECT REM PT RETURN 9FT ADLT (ELECTROSURGICAL) ×2
ELECT RESECT VAPORIZE 12D CBL (ELECTRODE) ×2 IMPLANT
ELECTRODE REM PT RTRN 9FT ADLT (ELECTROSURGICAL) ×1 IMPLANT
GLOVE BIOGEL M 7.0 STRL (GLOVE) ×7 IMPLANT
GLOVE SURG SS PI 8.0 STRL IVOR (GLOVE) ×1 IMPLANT
GOWN PREVENTION PLUS XLARGE (GOWN DISPOSABLE) ×1 IMPLANT
GOWN STRL NON-REIN LRG LVL3 (GOWN DISPOSABLE) ×2 IMPLANT
GOWN STRL REIN XL XLG (GOWN DISPOSABLE) ×2 IMPLANT
GUIDEWIRE STR DUAL SENSOR (WIRE) ×1 IMPLANT
HOLDER FOLEY CATH W/STRAP (MISCELLANEOUS) IMPLANT
KIT ASPIRATION TUBING (SET/KITS/TRAYS/PACK) ×2 IMPLANT
KIT SUPRAPUBIC CATH (MISCELLANEOUS) ×1 IMPLANT
MANIFOLD NEPTUNE II (INSTRUMENTS) ×2 IMPLANT
NDL SPNL 18GX3.5 QUINCKE PK (NEEDLE) IMPLANT
NEEDLE HYPO 22GX1.5 SAFETY (NEEDLE) IMPLANT
NEEDLE SPNL 18GX3.5 QUINCKE PK (NEEDLE) IMPLANT
NS IRRIG 1000ML POUR BTL (IV SOLUTION) ×2 IMPLANT
PACK CYSTO (CUSTOM PROCEDURE TRAY) ×2 IMPLANT
PENCIL BUTTON HOLSTER BLD 10FT (ELECTRODE) ×1 IMPLANT
PLUG CATH AND CAP STER (CATHETERS) IMPLANT
SCRUB PCMX 4 OZ (MISCELLANEOUS) ×1 IMPLANT
SUT ETHILON 2 0 PS N (SUTURE) ×2 IMPLANT
SUT ETHILON 3 0 FSL (SUTURE) ×1 IMPLANT
SUT ETHILON 3 0 PS 1 (SUTURE) IMPLANT
SUT SILK 1 TIES 10/18 (SUTURE) ×1 IMPLANT
SUT VIC AB 2-0 SH 27 (SUTURE) ×2
SUT VIC AB 2-0 SH 27X BRD (SUTURE) IMPLANT
SUT VIC AB 4-0 SH 27 (SUTURE) ×2
SUT VIC AB 4-0 SH 27XBRD (SUTURE) IMPLANT
SYR 30ML LL (SYRINGE) ×1 IMPLANT
SYRINGE IRR TOOMEY STRL 70CC (SYRINGE) ×2 IMPLANT
TOWEL OR 17X26 10 PK STRL BLUE (TOWEL DISPOSABLE) ×2 IMPLANT
TUBING CONNECTING 10 (TUBING) ×2 IMPLANT
WATER STERILE IRR 3000ML UROMA (IV SOLUTION) ×1 IMPLANT

## 2013-01-18 NOTE — Transfer of Care (Signed)
Immediate Anesthesia Transfer of Care Note  Patient: Brent Day.  Procedure(s) Performed: Procedure(s) (LRB) with comments: TRANSURETHRAL RESECTION OF THE PROSTATE WITH GYRUS INSTRUMENTS (N/A) - CYSTOSCOPY, SUPRAPUBIC TUBE PLACEMENT, CHANNEL TURP, RECTAL EXAM   INSERTION OF SUPRAPUBIC CATHETER (N/A) CYSTOSCOPY (N/A)  Patient Location: PACU  Anesthesia Type:General  Level of Consciousness: awake and patient cooperative  Airway & Oxygen Therapy: Patient Spontanous Breathing and Patient connected to face mask oxygen  Post-op Assessment: Report given to PACU RN, Post -op Vital signs reviewed and stable and Patient moving all extremities X 4  Post vital signs: Reviewed and stable  Complications: No apparent anesthesia complications

## 2013-01-18 NOTE — Progress Notes (Signed)
B met results noted with Marcelino Duster, R.N.- NURSE ON 4 WEST

## 2013-01-18 NOTE — Progress Notes (Signed)
GU post-op check  Patient doing well. Tolerated regular diet. No abdominal pain.   I explained the surgical findings and course of the surgery.  He is very deconditioned and I am concerned that he may not be an appropriate candidate to go back home.  PE: Gen: NAD, AAO CV: RRR, no rubs Chest: CTA-B, no crackles Abd: soft, ND, NTTP, SP tube in place with site dressed and draining clear-light pink urine. GU: Negative edema, foley in place  A/P: Bladder outlet obstruction SP tube placement and Transurethral incision of the prostate today.  -Continue catheters to drainage. -Due to my concern mentioned above about his deconditioning, I consulted PT, OT, case management and social work. He states he is amenable to going to a SNF or rehab if needed.

## 2013-01-18 NOTE — Preoperative (Signed)
Beta Blockers   Reason not to administer Beta Blockers:Not Applicable, took BB this am 

## 2013-01-18 NOTE — Anesthesia Preprocedure Evaluation (Signed)
Anesthesia Evaluation  Patient identified by MRN, date of birth, ID band Patient awake    Reviewed: Allergy & Precautions, H&P , NPO status , Patient's Chart, lab work & pertinent test results  Airway Mallampati: II TM Distance: >3 FB Neck ROM: Limited    Dental No notable dental hx.    Pulmonary Current Smoker,  breath sounds clear to auscultation  Pulmonary exam normal       Cardiovascular hypertension, Pt. on medications + dysrhythmias Atrial Fibrillation Rhythm:Regular Rate:Normal     Neuro/Psych negative neurological ROS  negative psych ROS   GI/Hepatic negative GI ROS, Neg liver ROS,   Endo/Other  negative endocrine ROS  Renal/GU negative Renal ROS  negative genitourinary   Musculoskeletal negative musculoskeletal ROS (+)   Abdominal   Peds negative pediatric ROS (+)  Hematology negative hematology ROS (+)   Anesthesia Other Findings   Reproductive/Obstetrics negative OB ROS                           Anesthesia Physical Anesthesia Plan  ASA: III  Anesthesia Plan: General   Post-op Pain Management:    Induction: Intravenous  Airway Management Planned: LMA  Additional Equipment:   Intra-op Plan:   Post-operative Plan:   Informed Consent: I have reviewed the patients History and Physical, chart, labs and discussed the procedure including the risks, benefits and alternatives for the proposed anesthesia with the patient or authorized representative who has indicated his/her understanding and acceptance.   Dental advisory given  Plan Discussed with: CRNA and Surgeon  Anesthesia Plan Comments:         Anesthesia Quick Evaluation

## 2013-01-18 NOTE — H&P (Signed)
Urology History and Physical Exam  CC: Bladder outlet obstruction  HPI: 79 year old male presents for cystoscopy, suprapubic tube placement, and channel transurethral resection of the prostate for bladder outlet obstruction. This gentleman has a history of locally advanced, high-grade prostate cancer. He has been treated initially with testosterone blockade which he understands is not of curative intent. This has caused the severe limitation on his performance status. He has also developed urinary retention. He has failed multiple voiding trials. This has been associated with a colonized bladder and recurrent urinary tract infections. He has significant discomfort with Foley catheter changes. We have discussed alternatives and he presents today for the above-mentioned procedure. We have discussed the risks, benefits, and likelihood of achieving goals. The risks include, but are not limited to, bleeding, infection, clot urinary retention, bowel injury, peritonitis, death, heart attack, stroke, need for continued Foley catheter. He understands that this will not reduce his risk of urinary tract infection.  Preoperative urine culture from 12/12/12 revealed Pseudomonas sensitive to zosyn and tobramycin which she will receive preoperatively according to pharmacy dosing. He was also cleared by Dr. Lewayne Bunting at [facility] to hold aspirin and noted he was low risk for surgery.   PMH: Past Medical History  Diagnosis Date  . Pancreatitis   . Fractured hip 09/2011  . Prostate tumor     to have bx by Dr. Margarita Grizzle  . UTI (lower urinary tract infection) 06/13/2012    has had x 3 months  . Falls 06/13/12    hit head approx. 1 month ago  . Unsteady gait 06/13/2012    "bad for awhile but getting worse recently"  . Slurred speech     slurred speech on 06/12/12 - didn't last long, slower talking recently  . Cancer     bladder  . Lung abnormality     spot on ct approx 3 months ago - to be followed -  [facility]  . 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 Nose fracture     after a fall - Oct. 2012  . Hypertension   . Dysrhythmia     history of a-fib  - followed by Dr. Ladona Ridgel   . Osteopenia   . Fractures, stress     spine  . 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 Hallucinations     will hospitalized -  . Foley catheter in place   . Anxiety   . Ulcer of ankle     rt    PSH: Past Surgical History  Procedure Date  . Cholecystectomy   . Partial hip arthroplasty   . Cystoscopy     has yearly due to bladder cancer  . Retinal detachment surgery     at least 3 and have been in both eyes  . Transurethral resection of bladder tumor     Allergies: No Known Allergies  Medications: Prescriptions prior to admission  Medication Sig Dispense Refill  . acetaminophen (TYLENOL) 650 MG CR tablet Take 650 mg by mouth every 8 (eight) hours as needed. For fever      . b complex vitamins tablet Take 1 tablet by mouth daily.      . Cholecalciferol (VITAMIN D3) 2000 UNITS capsule Take 2,000 Units by mouth daily.      . Ensure Plus (ENSURE PLUS) LIQD Take 237 mLs by mouth 2 (two) times daily between meals.      . finasteride (PROSCAR) 5  MG tablet Take 5 mg by mouth every evening.       . Flaxseed, Linseed, OIL Take 2 tablets by mouth daily.       Marland Kitchen gemfibrozil (LOPID) 600 MG tablet Take 600 mg by mouth 2 (two) times daily before a meal.      . LORazepam (ATIVAN) 1 MG tablet Take 1 mg by mouth 3 (three) times daily as needed. As needed for anxiety  Or sleep      . metoprolol tartrate (LOPRESSOR) 25 MG tablet Take 25 mg by mouth 2 (two) times daily.      . mirtazapine (REMERON) 30 MG tablet Take 30 mg by mouth at bedtime.      . saccharomyces boulardii (FLORASTOR) 250 MG capsule Take 250 mg by mouth 2 (two) times daily.          Social History: History   Social History  . Marital Status: Married    Spouse Name: N/A    Number of Children: N/A    . Years of Education: N/A   Occupational History  . Not on file.   Social History Main Topics  . Smoking status: Current Some Day Smoker -- 0.2 packs/day for 62 years    Types: Cigarettes  . Smokeless tobacco: Never Used  . Alcohol Use: No  . Drug Use: No  . Sexually Active: No   Other Topics Concern  . Not on file   Social History Narrative  . No narrative on file    Family History: Family History  Problem Relation Age of Onset  . Heart disease      No family history    Review of Systems: Positive: Bladder spasms. Negative: Fever, chest pain, or SOB.  A further 10 point review of systems was negative except what is listed in the HPI.  Physical Exam: Filed Vitals:   01/18/13 0745  BP: 113/68  Pulse: 87  Temp: 96.7 F (35.9 C)  Resp: 15    General: No acute distress.  Awake. Head:  Normocephalic.  Atraumatic. ENT:  EOMI.  Mucous membranes moist Neck:  Supple.  No lymphadenopathy. CV:  S1 present. S2 present. Regular rate. Pulmonary: Equal effort bilaterally.  Clear to auscultation bilaterally. Abdomen: Soft.  Non- tender to palpation. Skin:  Normal turgor.  No visible rash. Extremity: No gross deformity of bilateral upper extremities.  No gross deformity of    bilateral lower extremities. Neurologic: Alert. Appropriate mood.  Penis:  Foley catheter in place.  Orthotopic meatus.   Studies:  No results found for this basename: HGB:2,WBC:2,PLT:2 in the last 72 hours  No results found for this basename: NA:2,K:2,CL:2,CO2:2,BUN:2,CREATININE:2,CALCIUM:2,MAGNESIUM:2,GFRNONAA:2,GFRAA:2 in the last 72 hours   No results found for this basename: PT:2,INR:2,APTT:2 in the last 72 hours   No components found with this basename: ABG:2    Assessment:  Bladder outlet obstruction.   Plan: Procedure the operating room for cystoscopy, suprapubic tube placement, and channel transurethral resection of the prostate.

## 2013-01-18 NOTE — Op Note (Signed)
Urology Operative Report  Date of Procedure: 01/18/13  Surgeon: Natalia Leatherwood, MD Assistant: None  Preoperative Diagnosis: Bladder outlet obstruction Postoperative Diagnosis:  Same  Procedure(s): Cystoscopy Transurethral incision of prostate Suprapubic tube placement.  Estimated blood loss: Minimal  Specimen: None  Drains: SP tube and urethral foley.  Complications: None  Findings: Obstructing prostate tissue.  History of present illness: 79 year old male with advanced prostate cancer presents today for bladder outlet obstruction. He has been managed by indwelling Foley catheter. This is been very painful for changing and he elected to place a suprapubic tube. We discussed the possibility of also performing transurethral resection of the prostate. He presents today for that procedure.   Procedure in detail: After informed consent was obtained, the patient was taken to the operating room. They were placed in the supine position. SCDs were turned on and in place. IV antibiotics were infused, and general anesthesia was induced. A timeout was performed in which the correct patient, surgical site, and procedure were identified and agreed upon by the team.  The patient was placed in a dorsolithotomy position, making sure to pad all pertinent neurovascular pressure points. A belladonna and opium suppository was placed into his rectum. Rectal examination revealed  Resolution of the enlarged prostatic nodule noted prior to initiation of testosterone blockade. The genitals and abdomen from superior to the umbilicus down to the perineum was prepped and draped in the usual sterile fashion.   A rigid cystoscope was advanced through the urethra and into the bladder. There was noted to be obstructing prostatic tissue on the floor of the prostatic urethra and the lateral lobes of the prostate. Because of the small size of the prostate was noted that a transurethral incision of the prostate would  be more appropriate than a full transurethral resection of the prostate. The bladder was filled with sterile normal saline and your gated out until it was clear. There was noted to be cloudy urine. After this was clear attention was turned to the bladder dome with a 70 lens. The bladder was fully evaluated with a 70 and 12 lens and there were no bladder tumors. I was able to place pressure on the patient's lower abdomen directly in the midline approximate 1 cm above the pubic symphysis. I could see that this would be an appropriate spot as it would enter the anterior wall of the bladder. The patient was placed in a Trendelenburg position an incision was made in the lower abdomen as indicated before. Bovie electrocautery was used to maintain hemostasis. The Lowsley retractor was placed through the urethra into the bladder which was all ready full of irrigation. I made sure to run the tip of the Lowsley along the bladder neck until it reached the anterior bladder wall. Upward traction was placed on the Lowsley so that could be palpated through the abdominal incision. I then used a scalpel to cut down on to the Lowsley very cautiously to ensure there was no bowel involvement. After entering the fascia I entered the space of Retzius where there was fat and in the bladder was palpated. Incision was made through the bladder with the scalpel. The Lowsley retractor was then placed through the cystotomy and an 25 French council-tip catheter was tied to the tip of the Lowsley with a silk tie. His was pulled through into the bladder and out the urethra meatus. The catheter was disconnected from the Lowsley and retracted while placing a cystoscope at the same time. Once the suprapubic catheter was  placed back into the bladder it was inflated with 5 cc of sterile water. It was then sewn into place using nylon sutures to the skin.   Next, the visual obturator to the gyrus was placed into the bladder through the urethra. The  button electrode was used to perform transurethral incision of the prostate in normal saline. I first identified the ureters bilaterally which were well away from the prostate. I then carried down resection from the 5:00 to 7:00 position to the circular muscles of the bladder. This was carried back to just proximal to the verumontanum. Resection was taken down until there was a wide channel. Hemostasis was maintained. There was noted to be some leakage of fluid around suprapubic tube. The difference in the urination and drainage was approximately 1500 mL of normal saline. After this was completed the cystoscope was removed and a 20 [place] coud-tip hematuria catheter was placed with 20 cc of sterile water placed into the balloon. An additional 5 cc was placed into the suprapubic catheter for total of 10 cc. After this the suprapubic wound was irrigated with tobramycin solution (1 g in 1 L of NS). After this the subcutaneous tissue was closed with interrupted Vicryl sutures and the skin was closed with interrupted 4-0 Vicryl sutures. The remaining tobramycin irrigation solution was irrigated through the suprapubic tube by gravity drainage and out the urethral catheter. After this the suprapubic site was dressed with a gauze sponge and Hypofix. Both the suprapubic tube and Foley catheter were placed to dependent drainage. The patient was placed back in a supine position. Anesthesia was reversed, and he was taken to the PACU in stable condition. I did placed 10 cc of lidocaine jelly into the urethra prior to placing the Foley catheter.  The patient will be admitted for extended recovery. Do to his poor conditioning he may need placement in a skilled nursing facility. I will consult physical therapy and occupational therapy as well as case management.

## 2013-01-18 NOTE — Progress Notes (Signed)
B Met drawn by lab. 

## 2013-01-18 NOTE — Anesthesia Postprocedure Evaluation (Signed)
  Anesthesia Post-op Note  Patient: Brent Day.  Procedure(s) Performed: Procedure(s) (LRB): TRANSURETHRAL RESECTION OF THE PROSTATE WITH GYRUS INSTRUMENTS (N/A) INSERTION OF SUPRAPUBIC CATHETER (N/A) CYSTOSCOPY (N/A)  Patient Location: PACU  Anesthesia Type: General  Level of Consciousness: awake and alert   Airway and Oxygen Therapy: Patient Spontanous Breathing  Post-op Pain: mild  Post-op Assessment: Post-op Vital signs reviewed, Patient's Cardiovascular Status Stable, Respiratory Function Stable, Patent Airway and No signs of Nausea or vomiting  Last Vitals:  Filed Vitals:   01/18/13 1245  BP: 142/66  Pulse: 95  Temp:   Resp: 16    Post-op Vital Signs: stable   Complications: No apparent anesthesia complications

## 2013-01-19 ENCOUNTER — Encounter (HOSPITAL_COMMUNITY): Payer: Self-pay | Admitting: Urology

## 2013-01-19 LAB — BASIC METABOLIC PANEL
CO2: 23 mEq/L (ref 19–32)
Calcium: 8.4 mg/dL (ref 8.4–10.5)
Potassium: 4.1 mEq/L (ref 3.5–5.1)
Sodium: 139 mEq/L (ref 135–145)

## 2013-01-19 MED ORDER — HYOSCYAMINE SULFATE 0.125 MG PO TABS: 0.1250 mg | ORAL_TABLET | ORAL | Status: DC | PRN

## 2013-01-19 MED ORDER — BACITRACIN-NEOMYCIN-POLYMYXIN 400-5-5000 EX OINT: 1.0000 "application " | TOPICAL_OINTMENT | Freq: Three times a day (TID) | CUTANEOUS | Status: DC | PRN

## 2013-01-19 MED ORDER — SENNOSIDES-DOCUSATE SODIUM 8.6-50 MG PO TABS: 1.0000 | ORAL_TABLET | Freq: Two times a day (BID) | ORAL | Status: DC

## 2013-01-19 MED ORDER — MUPIROCIN 2 % EX OINT: TOPICAL_OINTMENT | Freq: Two times a day (BID) | CUTANEOUS | Status: DC

## 2013-01-19 MED ORDER — OXYCODONE HCL 5 MG PO TABS: 5.0000 mg | ORAL_TABLET | ORAL | Status: DC | PRN

## 2013-01-19 NOTE — Progress Notes (Signed)
I was contacted by the patient's nurse regarding his DNR status. He decided he would like to be made DNR for this hospitalization. I explained this would only carry weight for this hospitalization. He would still need to make provisions as an outpatient as recommended earlier today.

## 2013-01-19 NOTE — Discharge Summary (Signed)
Physician Discharge Summary  Patient ID: Brent Day. MRN: [id number] DOB/AGE: 1927/09/11 79 y.o.  Admit date: 01/18/2013 Discharge date: 01/19/2013  Admission Diagnoses: Bladder outlet obstruction  Discharge Diagnoses:  Bladder outlet obstruction  Discharged Condition: fair  Hospital Course:  This patient was admitted following suprapubic tube placement and transurethral incision of the prostate for bladder outlet obstruction. He was kept overnight for evaluation due to the risk of bleeding. He received IV antibiotics. He was able to ambulate at his baseline and tolerate a regular diet. Initially it was felt that he may need to get to a nursing home, but the patient tolerated the surgery well and declined placement in a rehabilitation or nursing facility. We did discuss end-of-life issues and he was interested in pursuing this further as an outpatient. Social work was contacted to put him in contact with these resources. Both catheters were draining well without problems. He was given instructions regarding these. We reviewed his discharge instructions which are also in written form and provided to the patient. He eventually decided that he would like to be made DNR for this hospitalization. He also would like to establish a DNR order for himself long-term. This paperwork was provided by the social worker, and I signed the paper work. I have explained to the patient indications a DO NOT RESUSCITATE in detail in the presence of his family. He voiced understanding.  Consults: Social work- for information about end of life planning.  Significant Diagnostic Studies: None  Treatments: surgery: Suprapubic tube placement and tranurethral incision of the prostate.  Discharge Exam: Blood pressure 120/54, pulse 66, temperature 98.2 F (36.8 C), temperature source Oral, resp. rate 18, height 5\' 11"  (1.803 m), weight 62.143 kg (137 lb), SpO2 99.00%. Refer to PE from progress note on date of  discharge.   Disposition: Home with family care.  Discharge Orders    Future Orders Please Complete By Expires   Discharge patient      Comments:   Discharge after next dose of zosyn. Teach Day catheter care and provide with leg bags & overnight bags. Patient needs to see Social Work before leaving.       Medication List     As of 01/19/2013  7:53 AM    STOP taking these medications         acetaminophen 650 MG CR tablet   Commonly known as: TYLENOL      Flaxseed (Linseed) Oil      TAKE these medications         b complex vitamins tablet   Take 1 tablet by mouth daily.      Ensure Plus Liqd   Take 237 mLs by mouth 2 (two) times daily between meals.      finasteride 5 MG tablet   Commonly known as: PROSCAR   Take 5 mg by mouth every evening.      gemfibrozil 600 MG tablet   Commonly known as: LOPID   Take 600 mg by mouth 2 (two) times daily before a meal.      hyoscyamine 0.125 MG tablet   Commonly known as: LEVSIN, ANASPAZ   Take 1 tablet (0.125 mg total) by mouth every 4 (four) hours as needed for cramping (bladder spasms).      LORazepam 1 MG tablet   Commonly known as: ATIVAN   Take 1 mg by mouth 3 (three) times daily as needed. As needed for anxiety  Or sleep      metoprolol tartrate 25  MG tablet   Commonly known as: LOPRESSOR   Take 25 mg by mouth 2 (two) times daily.      mirtazapine 30 MG tablet   Commonly known as: REMERON   Take 30 mg by mouth at bedtime.      mupirocin ointment 2 %   Commonly known as: BACTROBAN   Apply topically 2 (two) times daily. Apply to the nose until complete.      neomycin-bacitracin-polymyxin ointment   Commonly known as: NEOSPORIN   Apply 1 application topically 3 (three) times daily as needed (catheter irritation). apply to tip of penis      oxyCODONE 5 MG immediate release tablet   Commonly known as: Oxy IR/ROXICODONE   Take 1-2 tablets (5-10 mg total) by mouth every 4 (four) hours as needed.       saccharomyces boulardii 250 MG capsule   Commonly known as: FLORASTOR   Take 250 mg by mouth 2 (two) times daily.      senna-docusate 8.6-50 MG per tablet   Commonly known as: Senokot-S   Take 1 tablet by mouth 2 (two) times daily.      Vitamin D3 2000 UNITS capsule   Take 2,000 Units by mouth daily.           Follow-up Information    Follow up with Milford Cage, MD. On 02/02/2013. (2:45 pm)    Contact information:   [address] [place] FLO[address], [place] [address] [phone]          Signed: Milford Cage 01/19/2013, 7:53 AM

## 2013-01-19 NOTE — Progress Notes (Signed)
Pt given 2 leg bags and 2 drainage bags. Pt and daughter in law educated on catheter care and how to change from drainage bag to leg bag. Julio Sicks RN

## 2013-01-19 NOTE — Progress Notes (Signed)
Urology Progress Note  Subjective:     No acute urologic events overnight. This patient's son and daughter are present in the room today. He denies any abdominal pain. He is ambulating at baseline with a walker. He is tolerating a regular diet.  We discussed his deconditioned state. The patient states that he is no longer interested in going to rehabilitation or nursing facility because he feels as good as he did before surgery.  We discussed end-of-life issues. He is currently not a DNR/DNI. We discussed the need to discuss this among the family for him to make a decision.  ROS: Negative: chest pain or SOB  Objective:  Patient Vitals for the past 24 hrs:  BP Temp Temp src Pulse Resp SpO2 Height Weight  01/19/13 0248 107/50 mmHg - - 82  - - - -  01/19/13 0205 89/47 mmHg 97.7 F (36.5 C) Oral 81  18  97 % - -  01/18/13 2216 - - - 80  - - - -  01/18/13 2143 110/52 mmHg 97.9 F (36.6 C) Oral 92  18  100 % - -  01/18/13 1810 118/56 mmHg 97.7 F (36.5 C) Oral 89  16  100 % - -  01/18/13 1353 143/80 mmHg 97.6 F (36.4 C) Oral 89  12  100 % 5\' 11"  (1.803 m) 62.143 kg (137 lb)  01/18/13 1315 129/71 mmHg 98.5 F (36.9 C) - 91  16  99 % - -  01/18/13 1300 135/65 mmHg - - 92  15  100 % - -  01/18/13 1245 142/66 mmHg - - 95  16  100 % - -  01/18/13 1230 163/72 mmHg - - 95  - 100 % - -  01/18/13 1223 162/74 mmHg 97.6 F (36.4 C) - 94  12  99 % - -  01/18/13 0745 113/68 mmHg 96.7 F (35.9 C) Oral 87  15  92 % - -    Physical Exam: General:  No acute distress, awake Cardiovascular:    [x]   S1/S2 present, RRR  []   Irregularly irregular Chest:  CTA-B Abdomen:               []  Soft, appropriately TTP  [x]  Soft, NTTP, SP tube in place draining clear to light pink fluid. Flushes easily.  []  Soft, appropriately TTP, incision(s) clean/dry/intact  Genitourinary: Foley in place, draining clear to light pink urine. Flushes appropriately.     I/O last 3 completed shifts: In: 1330  [P.O.:480; I.V.:850] Out: 825 [Urine:825]  No results found for this basename: HGB:2,WBC:2,PLT:2 in the last 72 hours  Recent Labs  Basename 01/19/13 0534 01/18/13 1300   NA 139 135   K 4.1 4.4   CL 107 102   CO2 23 21   BUN 26* 25*   CREATININE 1.31 1.30   CALCIUM 8.4 9.2   GFRNONAA 49* 49*   GFRAA 56* 57*     No results found for this basename: PT:2,INR:2,APTT:2 in the last 72 hours   No components found with this basename: ABG:2    Length of stay: 1 days.  Assessment: Bladder outlet obstruction. POD#1 Suprapubic tube placement. Transurethral incision of prostate.   Plan: Discontinue physical therapy, occupational therapy, and case management as the patient is not interested in going to a facility and his ambulation is at baseline.  I have asked social work to see him to put him in contact with resources for making end-of-life decisions.  He will complete his 10:00 dose of  Zosyn and then be discharged home. Nurse to teach Foley catheter and suprapubic tube care.   Natalia Leatherwood, MD [phone]

## 2013-01-19 NOTE — Progress Notes (Signed)
CSW received consult for possible SNF/Rehab. CSW reviewed Dr. Hilario Quarry note indicating patient is no longer interested in going to SNF. RN states patient is interested in DNR - CSW provided yellow DNR to patient explained that an MD signature would make it valid. CSW signing off.   [name], LCSW [facility] Clinical Social Worker cell #: [phone]

## 2013-01-19 NOTE — Progress Notes (Signed)
INITIAL NUTRITION ASSESSMENT  DOCUMENTATION CODES Per approved criteria  -Severe malnutrition in the context of chronic illness   Pt meets criteria for SEVERE MALNUTRITION in the context of chronic illness as evidenced by severe muscle and fat wasting.   INTERVENTION: High calorie/high protein education  NUTRITION DIAGNOSIS: Malnutrition  related to cancer as evidenced by severe muscle and fat wasting.   Goal: Pt to meet >/= 90% of their estimated nutrition needs.   Monitor:  Weight, PO intake  Reason for Assessment: Malnutrition Screening Tool  79 y.o. male  Admitting Dx: Bladder outlet obstruction  ASSESSMENT: Pt is POD # 1 s/p Cystoscopy, Transurethral incision of prostate, Suprapubic tube placement for bladder outlet obstruction.   Pt and family provide hx and report that pt has really gone down hill over the last 18 months. Pt is living with a daughter who cooks very healthy and limits extra sugar, etc per son. Pt has been eating 3 meals per day and 2 [employer] supplements per day but this is much less than what he was eating previously. Son reports a major change in pt's muscle stores and that pt is much weaker and not getting around as good.   Pt currently with good appetite, eating 100% of all of his meals and ensure supplements.   Height: Ht Readings from Last 1 Encounters:  01/18/13 5\' 11"  (1.803 m)    Weight: Wt Readings from Last 1 Encounters:  01/18/13 137 lb (62.143 kg)    Ideal Body Weight: 78.1 kg  % Ideal Body Weight: 80%  Wt Readings from Last 10 Encounters:  01/18/13 137 lb (62.143 kg)  01/18/13 137 lb (62.143 kg)  01/09/13 137 lb (62.143 kg)  11/28/12 141 lb 3.2 oz (64.048 kg)  09/19/12 142 lb (64.411 kg)  08/18/12 145 lb (65.772 kg)  07/27/12 147 lb 11.3 oz (67 kg)  06/28/12 145 lb (65.772 kg)  06/15/12 147 lb 11.2 oz (66.996 kg)  10/22/10 168 lb (76.204 kg)    Usual Body Weight: as above  % Usual Body Weight: 93% as of  07/2012  BMI:  Body mass index is 19.11 kg/(m^2). WNL   Estimated Nutritional Needs: Kcal: 1800-2000 Protein: 80-95 grams Fluid: >1.8 L/day  Skin: incisions, abrasions, skin tear  Nutrition Focused Physical Exam:  Subcutaneous Fat:  Orbital Region: WNL Upper Arm Region: severe wasting Thoracic and Lumbar Region: severe wasting  Muscle:  Temple Region: severe wasting Clavicle Bone Region: severe wasting Clavicle and Acromion Bone Region: severe wasting Scapular Bone Region: severe wasting Dorsal Hand: severe wasting Patellar Region: severe wasting Anterior Thigh Region: severe wasting  Posterior Calf Region: severe wasting  Edema: not present  Diet Order: General Meal Completion: 100%   EDUCATION NEEDS: -Education needs addressed   Intake/Output Summary (Last 24 hours) at 01/19/13 1059 Last data filed at 01/19/13 0900  Gross per 24 hour  Intake   2170 ml  Output   1400 ml  Net    770 ml    Last BM: 1/29   Labs:   Lab 01/19/13 0534 01/18/13 1300  NA 139 135  K 4.1 4.4  CL 107 102  CO2 23 21  BUN 26* 25*  CREATININE 1.31 1.30  CALCIUM 8.4 9.2  MG -- --  PHOS -- --  GLUCOSE 157* 127*    CBG (last 3)  No results found for this basename: GLUCAP:3 in the last 72 hours  Scheduled Meds:   . acetaminophen  1,000 mg Intravenous Q6H  .  antiseptic oral rinse  15 mL Mouth Rinse BID  . feeding supplement  237 mL Oral BID BM  . finasteride  5 mg Oral QPM  . gemfibrozil  600 mg Oral BID AC  . metoprolol tartrate  25 mg Oral BID  . mirtazapine  30 mg Oral QHS  . mupirocin ointment   Nasal BID  . piperacillin-tazobactam (ZOSYN)  IV  3.375 g Intravenous Q8H  . saccharomyces boulardii  250 mg Oral BID  . senna-docusate  1 tablet Oral BID  . sodium chloride  3 mL Intravenous Q12H    Continuous Infusions:   . sodium chloride Stopped (01/19/13 0846)    Past Medical History  Diagnosis Date  . Pancreatitis   . Fractured hip 09/2011  . Prostate tumor      to have bx by Dr. Margarita Grizzle  . UTI (lower urinary tract infection) 06/13/2012    has had x 3 months  . Falls 06/13/12    hit head approx. 1 month ago  . Unsteady gait 06/13/2012    "bad for awhile but getting worse recently"  . Slurred speech     slurred speech on 06/12/12 - didn't last long, slower talking recently  . Cancer     bladder  . Lung abnormality     spot on ct approx 3 months ago - to be followed - [facility]  . 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 Nose fracture     after a fall - Oct. 2012  . Hypertension   . Dysrhythmia     history of a-fib  - followed by Dr. Ladona Ridgel   . Osteopenia   . Fractures, stress     spine  . 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 Hallucinations     will hospitalized -  . Foley catheter in place   . Anxiety   . Ulcer of ankle     rt    Past Surgical History  Procedure Date  . Cholecystectomy   . Partial hip arthroplasty   . Cystoscopy     has yearly due to bladder cancer  . Retinal detachment surgery     at least 3 and have been in both eyes  . Transurethral resection of bladder tumor     Kendell Bane RD, LDN, CNSC [phone] Pager [phone] After Hours Pager

## 2013-04-24 ENCOUNTER — Ambulatory Visit (HOSPITAL_COMMUNITY)
Admission: RE | Admit: 2013-04-24 | Discharge: 2013-04-24 | Disposition: A | Discharge status: Home / Self Care | Source: Ambulatory Visit | Attending: Internal Medicine | Admitting: Internal Medicine

## 2013-04-24 ENCOUNTER — Other Ambulatory Visit (HOSPITAL_COMMUNITY): Payer: Self-pay | Admitting: Internal Medicine

## 2013-04-24 DIAGNOSIS — Y929 Unspecified place or not applicable: Secondary | ICD-10-CM | POA: Insufficient documentation

## 2013-04-24 DIAGNOSIS — M5380 Other specified dorsopathies, site unspecified: Secondary | ICD-10-CM | POA: Diagnosis not present

## 2013-04-24 DIAGNOSIS — M549 Dorsalgia, unspecified: Secondary | ICD-10-CM | POA: Diagnosis present

## 2013-04-24 DIAGNOSIS — Q762 Congenital spondylolisthesis: Secondary | ICD-10-CM | POA: Insufficient documentation

## 2013-04-24 DIAGNOSIS — M545 Low back pain, unspecified: Secondary | ICD-10-CM | POA: Insufficient documentation

## 2013-04-24 DIAGNOSIS — W19XXXA Unspecified fall, initial encounter: Secondary | ICD-10-CM | POA: Insufficient documentation

## 2013-04-24 DIAGNOSIS — I700 Atherosclerosis of aorta: Secondary | ICD-10-CM | POA: Insufficient documentation

## 2013-06-24 IMAGING — CR DG CHEST 1V
1 series · 1 of 1 positions shown · non-contrast
Comparison: Plain films 06/18/2011

CLINICAL DATA: Fall, right hip fracture

CHEST - 1 VIEW

[t chest supine]
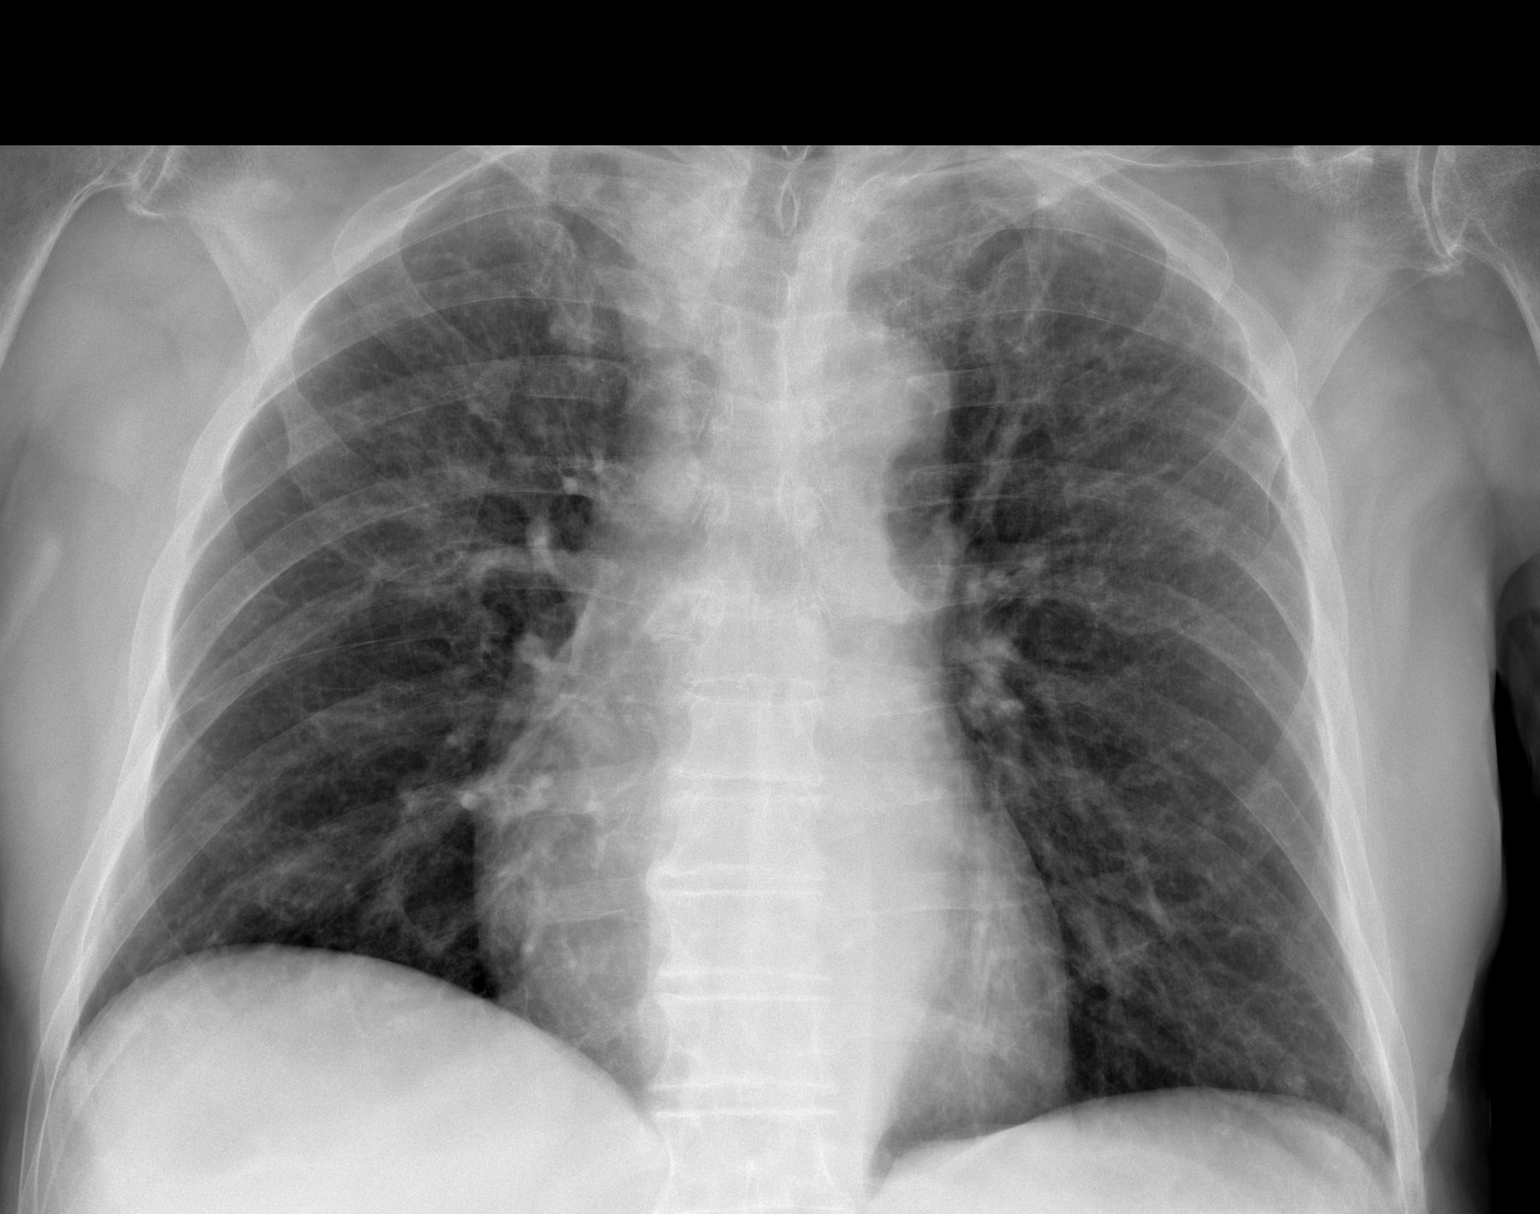

[1 of 1 positions shown; findings below may reference images not displayed]

FINDINGS: Normal mediastinum and cardiac silhouette.  There is
chronic bronchitic markings and upper lobes and lower lobes.  No
pleural fluid or pulmonary edema.  No pneumothorax. Presumable
nipple shadow at the left lung base.
IMPRESSION: Chronic bronchitic markings.  No acute cardiopulmonary process
evident.

## 2013-08-15 ENCOUNTER — Ambulatory Visit: Payer: Medicare Other

## 2013-08-15 ENCOUNTER — Ambulatory Visit (INDEPENDENT_AMBULATORY_CARE_PROVIDER_SITE_OTHER): Payer: Medicare Other | Admitting: Family Medicine

## 2013-08-15 VITALS — BP 122/70 | HR 85 | Temp 98.0°F | Resp 18 | Ht 67.5 in | Wt 146.0 lb

## 2013-08-15 DIAGNOSIS — S01309A Unspecified open wound of unspecified ear, initial encounter: Secondary | ICD-10-CM

## 2013-08-15 DIAGNOSIS — Z23 Encounter for immunization: Secondary | ICD-10-CM

## 2013-08-15 DIAGNOSIS — R51 Headache: Secondary | ICD-10-CM

## 2013-08-15 DIAGNOSIS — S01312A Laceration without foreign body of left ear, initial encounter: Secondary | ICD-10-CM

## 2013-08-15 NOTE — Patient Instructions (Addendum)
Wound Care Wound care helps prevent pain and infection.  You may need a tetanus shot if:  You cannot remember when you had your last tetanus shot.  You have never had a tetanus shot.  The injury broke your skin. If you need a tetanus shot and you choose not to have one, you may get tetanus. Sickness from tetanus can be serious. HOME CARE   Only take medicine as told by your doctor.  Clean the wound daily with mild soap and water.  Change any bandages (dressings) as told by your doctor.  Put medicated cream and a bandage on the wound as told by your doctor.  Change the bandage if it gets wet, dirty, or starts to smell.  Take showers. Do not take baths, swim, or do anything that puts your wound under water.  Rest and raise (elevate) the wound until the pain and puffiness (swelling) are better.  Keep all doctor visits as told. GET HELP RIGHT AWAY IF:   Yellowish-white fluid (pus) comes from the wound.  Medicine does not lessen your pain.  There is a red streak going away from the wound.  You have a fever. MAKE SURE YOU:   Understand these instructions.  Will watch your condition.  Will get help right away if you are not doing well or get worse. Document Released: 09/15/2008 Document Revised: 02/29/2012 Document Reviewed: 04/12/2011 [facility] Patient Information 2014 [place], [place]. Tetanus, Diphtheria (Td) or Tetanus, Diphtheria, Pertussis (Tdap) Vaccine What You Need to Know WHY GET VACCINATED? Tetanus, diphtheria and pertussis can be very serious diseases. TETANUS (Lockjaw) causes painful muscle spasms and stiffness, usually all over the body.  Tetanus can lead to tightening of muscles in the head and neck so the victim cannot open his mouth or swallow, or sometimes even breathe. Tetanus kills about 1 out of 5 people who are infected. DIPHTHERIA can cause a thick membrane to cover the back of the throat.  Diphtheria can lead to breathing problems, paralysis, heart  failure, and even death. PERTUSSIS (Whooping Cough) causes severe coughing spells which can lead to difficulty breathing, vomiting, and disturbed sleep.  Pertussis can lead to weight loss, incontinence, rib fractures and passing out from violent coughing. Up to 2 in 100 adolescents and 5 in 100 adults with pertussis are hospitalized or have complications, including pneumonia and death. These 3 diseases are all caused by bacteria. Diphtheria and pertussis are spread from person to person. Tetanus enters the body through cuts, scratches, or wounds. The [place] saw as many as 200,000 cases a year of diphtheria and pertussis before vaccines were available, and hundreds of cases of tetanus. Since then, tetanus and diphtheria cases have dropped by about 99% and pertussis cases by about 92%. Children 73 years of age and younger get DTaP vaccine to protect them from these three diseases. But older children, adolescents, and adults need protection too. VACCINES FOR ADOLESCENTS AND ADULTS: TD AND TDAP Two vaccines are available to protect people 59 years of age and older from these diseases:  Td vaccine has been used for many years. It protects against tetanus and diphtheria.  Tdap vaccine was licensed in 2005. It is the first vaccine for adolescents and adults that protects against pertussis as well as tetanus and diphtheria. A Td booster dose is recommended every 10 years. Tdap is given only once. WHICH VACCINE, AND WHEN? Ages 7 through 18 years  A dose of Tdap is recommended at age 38 or 29. This dose could be  given as early as age 47 for children who missed one or more childhood doses of DTaP.  Children and adolescents who did not get a complete series of DTaP shots by age 46 should complete the series using a combination of Td and Tdap. Age 64 years and Older  All adults should get a booster dose of Td every 10 years. Adults under 65 who have never gotten Tdap should get a dose of Tdap as their  next booster dose. Adults 65 and older may get one booster dose of Tdap.  Adults (including women who may become pregnant and adults 32 and older) who expect to have close contact with a baby younger than 70 months of age should get a dose of Tdap to help protect the baby from pertussis.  Healthcare professionals who have direct patient contact in hospitals or clinics should get one dose of Tdap. Protection After a Wound  A person who gets a severe cut or burn might need a dose of Td or Tdap to prevent tetanus infection. Tdap should be used for anyone who has never had a dose previously. Td should be used if Tdap is not available, or for:  Anybody who has already had a dose of Tdap.  Children 7 through 40 years of age who completed the childhood DTaP series.  Adults 65 and older. Pregnant Women  Pregnant women who have never had a dose of Tdap should get one, after the 20th week of gestation and preferably during the 3rd trimester. If they do not get Tdap during their pregnancy they should get a dose as soon as possible after delivery. Pregnant women who have previously received Tdap and need tetanus or diphtheria vaccine while pregnant should get Td. Tdap and Td may be given at the same time as other vaccines. SOME PEOPLE SHOULD NOT BE VACCINATED OR SHOULD WAIT  Anyone who has had a life-threatening allergic reaction after a dose of any tetanus, diphtheria, or pertussis containing vaccine should not get Td or Tdap.  Anyone who has a severe allergy to any component of a vaccine should not get that vaccine. Tell your doctor if the person getting the vaccine has any severe allergies.  Anyone who had a coma, or long or multiple seizures within 7 days after a dose of DTP or DTaP should not get Tdap, unless a cause other than the vaccine was found. These people may get Td.  Talk to your doctor if the person getting either vaccine:  Has epilepsy or another nervous system problem.  Had severe  swelling or severe pain after a previous dose of DTP, DTaP, DT, Td, or Tdap vaccine.  Has had Guillain Barr Syndrome (GBS). Anyone who has a moderate or severe illness on the day the shot is scheduled should usually wait until they recover before getting Tdap or Td vaccine. A person with a mild illness or low fever can usually be vaccinated. WHAT ARE THE RISKS FROM TDAP AND TD VACCINES? With a vaccine, as with any medicine, there is always a small risk of a life-threatening allergic reaction or other serious problem. Brief fainting spells and related symptoms (such as jerking movements) can happen after any medical procedure, including vaccination. Sitting or lying down for about 15 minutes after a vaccination can help prevent fainting and injuries caused by falls. Tell your doctor if the patient feels dizzy or lightheaded, or has vision changes or ringing in the ears. Getting tetanus, diphtheria, or pertussis would be much more likely  to lead to severe problems than getting either Td or Tdap vaccine. Problems reported after Td and Tdap vaccines are listed below. Mild Problems (noticeable, but did not interfere with activities) Tdap  Pain (about 3 in 4 adolescents and 2 in 3 adults).  Redness or swelling (about 1 in 5).  Mild fever of at least 100.4 F (38 C) (up to about 1 in 25 adolescents and 1 in 100 adults).  Headache (about 4 in 10 adolescents and 3 in 10 adults).  Tiredness (about 1 in 3 adolescents and 1 in 4 adults).  Nausea, vomiting, diarrhea, or stomach ache (up to 1 in 4 adolescents and 1 in 10 adults).  Chills, body aches, sore joints, rash, or swollen glands (uncommon). Td  Pain (up to about 8 in 10).  Redness or swelling at the injection site (up to about 1 in 3).  Mild fever (up to about 1 in 15).  Headache or tiredness (uncommon). Moderate Problems (interfered with activities, but did not require medical attention) Tdap  Pain at the injection site (about 1 in  20 adolescents and 1 in 100 adults).  Redness or swelling at the injection site (up to about 1 in 16 adolescents and 1 in 25 adults).  Fever over 102 F (38.9 C) (about 1 in 100 adolescents and 1 in 250 adults).  Headache (1 in 300).  Nausea, vomiting, diarrhea, or stomach ache (up to 3 in 100 adolescents and 1 in 100 adults). Td  Fever over 102 F (38.9 C) (rare). Tdap or Td  Extensive swelling of the arm where the shot was given (up to about 3 in 100). Severe Problems (Unable to perform usual activities; required medical attention) Tdap or Td  Swelling, severe pain, bleeding, and redness in the arm where the shot was given (rare). A severe allergic reaction could occur after any vaccine. They are estimated to occur less than once in a million doses. WHAT IF THERE IS A SEVERE REACTION? What should I look for? Any unusual condition, such as a severe allergic reaction or a high fever. If a severe allergic reaction occurred, it would be within a few minutes to an hour after the shot. Signs of a serious allergic reaction can include difficulty breathing, weakness, hoarseness or wheezing, a fast heartbeat, hives, dizziness, paleness, or swelling of the throat. What should I do?  Call a doctor, or get the person to a doctor right away.  Tell your doctor what happened, the date and time it happened, and when the vaccination was given.  Ask your provider to report the reaction by filing a Vaccine Adverse Event Reporting System (VAERS) form. Or, you can file this report through the VAERS website at www.vaers.LAgents.no or by calling 1-630-214-1158. VAERS does not provide medical advice. THE NATIONAL VACCINE INJURY COMPENSATION PROGRAM The National Vaccine Injury Compensation Program (VICP) was created in 1986. Persons who believe they may have been injured by a vaccine can learn about the program and about filing a claim by calling 1-626 614 4062 or visiting the VICP website at  SpiritualWord.at. HOW CAN I LEARN MORE?  Your doctor can give you the vaccine package insert or suggest other sources of information.  Call your local or state health department.  Contact the Centers for Disease Control and Prevention (CDC):  Call 530-015-3859 (1-800-CDC-INFO).  Visit the [facility] website at PicCapture.uy. CDC Td and Tdap Interim VIS (01/13/11) Document Released: 10/04/2006 Document Revised: 02/29/2012 Document Reviewed: 01/13/2011 [employer] Patient Information 2014 [place], [place].

## 2013-08-15 NOTE — Progress Notes (Signed)
 [facility]:  Office Visit  Chief Complaint:  Chief Complaint  Patient presents with  . Fall    injury to 08/15/2013  . Shoulder Injury    left  . Ear Injury    left with bleeding and face    HPI: Brent Day. is a 79 y.o. male who complains of  Left ear injury today at 3 am, slid off bed because the floor was slippery. He lives with his daughter, she states that he did not complain of much except for some rib pain initially but now it is gone. He does NOT have shoulder or hip pain. He is not on blood thinners. He is UTD on tetanus per daughter but she does not know for sure so will recheck. He is currently being treated for UTI. Both he and daughter denies confusion or neuro sxs. He is on prednisone x several months. He has prostate tumor with h/o TURP and also bladder cancer and BOO using foley cath/urinary stoma   Past Medical History  Diagnosis Date  . Pancreatitis   . Fractured hip 09/2011  . Prostate tumor     to have bx by Dr. Margarita Grizzle  . UTI (lower urinary tract infection) 06/13/2012    has had x 3 months  . Falls 06/13/12    hit head approx. 1 month ago  . Unsteady gait 06/13/2012    "bad for awhile but getting worse recently"  . Slurred speech     slurred speech on 06/12/12 - didn't last long, slower talking recently  . Cancer     bladder  . Lung abnormality     spot on ct approx 3 months ago - to be followed - [facility]  . 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 Nose fracture     after a fall - Oct. 2012  . Hypertension   . Dysrhythmia     history of a-fib  - followed by Dr. Ladona Ridgel   . Osteopenia   . Fractures, stress     spine  . 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 Hallucinations     will hospitalized -  . Foley catheter in place   . Anxiety   . Ulcer of ankle     rt   Past Surgical History  Procedure Laterality Date  . Cholecystectomy    . Partial hip arthroplasty    .  Cystoscopy      has yearly due to bladder cancer  . Retinal detachment surgery      at least 3 and have been in both eyes  . Transurethral resection of bladder tumor    . Transurethral resection of prostate  01/18/2013    Procedure: TRANSURETHRAL RESECTION OF THE PROSTATE WITH GYRUS INSTRUMENTS;  Surgeon: Milford Cage, MD;  Location: [facility];  Service: Urology;  Laterality: N/A;  CYSTOSCOPY, SUPRAPUBIC TUBE PLACEMENT, CHANNEL TURP, RECTAL EXAM    . Insertion of suprapubic catheter  01/18/2013    Procedure: INSERTION OF SUPRAPUBIC CATHETER;  Surgeon: Milford Cage, MD;  Location: [facility];  Service: Urology;  Laterality: N/A;  . Cystoscopy  01/18/2013    Procedure: CYSTOSCOPY;  Surgeon: Milford Cage, MD;  Location: [facility];  Service: Urology;  Laterality: N/A;   History   Social History  . Marital Status: Married    Spouse Name: N/A    Number of Children: N/A  . Years of  Education: N/A   Social History Main Topics  . Smoking status: Current Some Day Smoker -- 0.25 packs/day for 62 years    Types: Cigarettes  . Smokeless tobacco: Never Used  . Alcohol Use: No  . Drug Use: No  . Sexual Activity: No   Other Topics Concern  . None   Social History Narrative  . None   Family History  Problem Relation Age of Onset  . Heart disease      No family history   No Known Allergies Prior to Admission medications   Medication Sig Start Date End Date Taking? Authorizing Provider  acetaminophen (TYLENOL 8 HOUR) 650 MG CR tablet Take 650 mg by mouth every 8 (eight) hours as needed for pain.   Yes Historical Provider, MD  b complex vitamins tablet Take 1 tablet by mouth daily.   Yes Historical Provider, MD  Cholecalciferol (VITAMIN D3) 2000 UNITS capsule Take 2,000 Units by mouth daily.   Yes Historical Provider, MD  ciprofloxacin (CIPRO) 500 MG tablet Take 500 mg by mouth 2 (two) times daily.   Yes Historical Provider, MD  Ensure Plus (ENSURE PLUS) LIQD Take 237 mLs by  mouth 2 (two) times daily between meals.   Yes Historical Provider, MD  HYDROcodone-acetaminophen (NORCO) 10-325 MG per tablet Take 1 tablet by mouth every 4 (four) hours as needed for pain.   Yes Historical Provider, MD  LORazepam (ATIVAN) 1 MG tablet Take 1 mg by mouth 3 (three) times daily as needed. As needed for anxiety  Or sleep   Yes Historical Provider, MD  metoprolol tartrate (LOPRESSOR) 25 MG tablet Take 25 mg by mouth 2 (two) times daily.   Yes Historical Provider, MD  mirabegron ER (MYRBETRIQ) 25 MG TB24 tablet Take 25 mg by mouth daily.   Yes Historical Provider, MD  mirtazapine (REMERON) 30 MG tablet Take 30 mg by mouth at bedtime.   Yes Historical Provider, MD  neomycin-bacitracin-polymyxin (NEOSPORIN) ointment Apply 1 application topically 3 (three) times daily as needed (catheter irritation). apply to tip of penis 01/19/13  Yes Milford Cage, MD  finasteride (PROSCAR) 5 MG tablet Take 5 mg by mouth every evening.     Historical Provider, MD  gemfibrozil (LOPID) 600 MG tablet Take 600 mg by mouth 2 (two) times daily before a meal.    Historical Provider, MD  hyoscyamine (LEVSIN, ANASPAZ) 0.125 MG tablet Take 1 tablet (0.125 mg total) by mouth every 4 (four) hours as needed for cramping (bladder spasms). 01/19/13   Milford Cage, MD  mupirocin ointment (BACTROBAN) 2 % Apply topically 2 (two) times daily. Apply to the nose until complete. 01/19/13   Milford Cage, MD  oxyCODONE (OXY IR/ROXICODONE) 5 MG immediate release tablet Take 1-2 tablets (5-10 mg total) by mouth every 4 (four) hours as needed. 01/19/13   Milford Cage, MD  saccharomyces boulardii (FLORASTOR) 250 MG capsule Take 250 mg by mouth 2 (two) times daily.     Historical Provider, MD  senna-docusate (SENOKOT-S) 8.6-50 MG per tablet Take 1 tablet by mouth 2 (two) times daily. 01/19/13   Milford Cage, MD     ROS: The patient denies fevers, chills, night sweats, unintentional weight loss,  chest pain, palpitations, wheezing, dyspnea on exertion, nausea, vomiting, abdominal pain, dysuria, hematuria, melena, numbness, weakness, or tingling.   All other systems have been reviewed and were otherwise negative with the exception of those mentioned in the HPI and as above.    PHYSICAL EXAM:  Filed Vitals:   08/15/13 1559  BP: 122/70  Pulse: 85  Temp: 98 F (36.7 C)  Resp: 18  Spo2    97% Filed Vitals:   08/15/13 1559  Height: 5' 7.5" (1.715 m)  Weight: 146 lb (66.225 kg)   Body mass index is 22.52 kg/(m^2).  General: Alert, no acute distress HEENT:  Normocephalic, atraumatic, oropharynx patent. EOMI, PERRLA, EOMI Cardiovascular:  Regular rate and rhythm, no rubs murmurs or gallops.  No Carotid bruits, radial pulse intact. No pedal edema.  Respiratory: Clear to auscultation bilaterally.  No wheezes, rales, or rhonchi.  No cyanosis, no use of accessory musculature GI: No organomegaly, abdomen is soft and non-tender, positive bowel sounds.  No masses. Skin:+ left ear laceration, bleeding Neurologic: Facial musculature symmetric. CN 2-12 grossly nl Psychiatric: Patient is appropriate throughout our interaction. Lymphatic: No cervical lymphadenopathy Musculoskeletal: Gait intact. Head-nl Face-+ ecchymosis on left cheek and small hematoma Neck-nl ROM Shoulder-full ROM, 5/5 strength Ribs nontender Hip exam-nl, good IR/ER, straight leg, paltar and dorsif lexion nl, he is able to walk for me with walker  Skin exam was normal except for old abrasions on elbow   LABS: Results for orders placed during the hospital encounter of 01/18/13  BASIC METABOLIC PANEL      Result Value Range   Sodium 135  135 - 145 mEq/L   Potassium 4.4  3.5 - 5.1 mEq/L   Chloride 102  96 - 112 mEq/L   CO2 21  19 - 32 mEq/L   Glucose, Bld 127 (*) 70 - 99 mg/dL   BUN 25 (*) 6 - 23 mg/dL   Creatinine, Ser 1.61  0.50 - 1.35 mg/dL   Calcium 9.2  8.4 - 09.6 mg/dL   GFR calc non Af Amer 49 (*) >90  mL/min   GFR calc Af Amer 57 (*) >90 mL/min  BASIC METABOLIC PANEL      Result Value Range   Sodium 139  135 - 145 mEq/L   Potassium 4.1  3.5 - 5.1 mEq/L   Chloride 107  96 - 112 mEq/L   CO2 23  19 - 32 mEq/L   Glucose, Bld 157 (*) 70 - 99 mg/dL   BUN 26 (*) 6 - 23 mg/dL   Creatinine, Ser 0.45  0.50 - 1.35 mg/dL   Calcium 8.4  8.4 - 40.9 mg/dL   GFR calc non Af Amer 49 (*) >90 mL/min   GFR calc Af Amer 56 (*) >90 mL/min     EKG/XRAY:   Primary read interpreted by Dr. Conley Rolls at [facility].  no fracture or dislocation   ASSESSMENT/PLAN: Encounter Diagnoses  Name Primary?  . Facial pain Yes  . Laceration of ear, left, initial encounter    Dermabond and steristrips Monitor for AMS changes or gait changes or worsening pain We deferred xrays because he was not complaining of pain and PE was normal  F/u prn Gross sideeffects, risk and benefits, and alternatives of medications d/w patient. Patient is aware that all medications have potential sideeffects and we are unable to predict every sideeffect or drug-drug interaction that may occur.  ,  PHUONG, DO 08/15/2013 5:20 PM

## 2013-08-15 NOTE — Progress Notes (Signed)
Verbal consent obtained from the patient.  Laceration at anterior aspect of auricle closed with Dermabond.  Laceration at tragus closed with #2 steri strips.  Laceration at posterior auricle closed with Dermabond.  Discussed wound care.  Pt tolerated very well.

## 2013-09-18 ENCOUNTER — Emergency Department (HOSPITAL_BASED_OUTPATIENT_CLINIC_OR_DEPARTMENT_OTHER): Payer: Medicare Other

## 2013-09-18 ENCOUNTER — Emergency Department (HOSPITAL_BASED_OUTPATIENT_CLINIC_OR_DEPARTMENT_OTHER)
Admission: EM | Admit: 2013-09-18 | Discharge: 2013-09-18 | Disposition: A | Discharge status: Home / Self Care | Payer: Medicare Other | Attending: Emergency Medicine | Admitting: Emergency Medicine

## 2013-09-18 ENCOUNTER — Encounter (HOSPITAL_BASED_OUTPATIENT_CLINIC_OR_DEPARTMENT_OTHER): Payer: Self-pay

## 2013-09-18 DIAGNOSIS — Z79899 Other long term (current) drug therapy: Secondary | ICD-10-CM | POA: Insufficient documentation

## 2013-09-18 DIAGNOSIS — I10 Essential (primary) hypertension: Secondary | ICD-10-CM | POA: Insufficient documentation

## 2013-09-18 DIAGNOSIS — Z8551 Personal history of malignant neoplasm of bladder: Secondary | ICD-10-CM | POA: Insufficient documentation

## 2013-09-18 DIAGNOSIS — Z8719 Personal history of other diseases of the digestive system: Secondary | ICD-10-CM | POA: Insufficient documentation

## 2013-09-18 DIAGNOSIS — Z8781 Personal history of (healed) traumatic fracture: Secondary | ICD-10-CM | POA: Insufficient documentation

## 2013-09-18 DIAGNOSIS — F411 Generalized anxiety disorder: Secondary | ICD-10-CM | POA: Insufficient documentation

## 2013-09-18 DIAGNOSIS — Z8739 Personal history of other diseases of the musculoskeletal system and connective tissue: Secondary | ICD-10-CM | POA: Insufficient documentation

## 2013-09-18 DIAGNOSIS — Z872 Personal history of diseases of the skin and subcutaneous tissue: Secondary | ICD-10-CM | POA: Insufficient documentation

## 2013-09-18 DIAGNOSIS — N390 Urinary tract infection, site not specified: Secondary | ICD-10-CM | POA: Insufficient documentation

## 2013-09-18 DIAGNOSIS — F17200 Nicotine dependence, unspecified, uncomplicated: Secondary | ICD-10-CM | POA: Insufficient documentation

## 2013-09-18 DIAGNOSIS — R0602 Shortness of breath: Secondary | ICD-10-CM | POA: Insufficient documentation

## 2013-09-18 DIAGNOSIS — R443 Hallucinations, unspecified: Secondary | ICD-10-CM | POA: Insufficient documentation

## 2013-09-18 HISTORY — DX: Unspecified atrial fibrillation: I48.91

## 2013-09-18 LAB — CBC WITH DIFFERENTIAL/PLATELET
Eosinophils Relative: 1 % (ref 0–5)
Hemoglobin: 12.8 g/dL — ABNORMAL LOW (ref 13.0–17.0)
Lymphocytes Relative: 11 % — ABNORMAL LOW (ref 12–46)
Lymphs Abs: 1.3 10*3/uL (ref 0.7–4.0)
MCV: 96 fL (ref 78.0–100.0)
Neutrophils Relative %: 81 % — ABNORMAL HIGH (ref 43–77)
Platelets: 262 10*3/uL (ref 150–400)
RBC: 4.03 MIL/uL — ABNORMAL LOW (ref 4.22–5.81)
WBC: 11.6 10*3/uL — ABNORMAL HIGH (ref 4.0–10.5)

## 2013-09-18 LAB — COMPREHENSIVE METABOLIC PANEL
AST: 20 U/L (ref 0–37)
Albumin: 3.7 g/dL (ref 3.5–5.2)
CO2: 24 mEq/L (ref 19–32)
Chloride: 99 mEq/L (ref 96–112)
Creatinine, Ser: 1.4 mg/dL — ABNORMAL HIGH (ref 0.50–1.35)
GFR calc non Af Amer: 45 mL/min — ABNORMAL LOW (ref >90)
Sodium: 134 mEq/L — ABNORMAL LOW (ref 135–145)
Total Bilirubin: 0.4 mg/dL (ref 0.3–1.2)
Total Protein: 7.4 g/dL (ref 6.0–8.3)

## 2013-09-18 LAB — URINALYSIS, ROUTINE W REFLEX MICROSCOPIC
Glucose, UA: NEGATIVE mg/dL
Hgb urine dipstick: NEGATIVE
Protein, ur: NEGATIVE mg/dL
Specific Gravity, Urine: 1.008 (ref 1.005–1.030)
Urobilinogen, UA: 0.2 mg/dL (ref 0.0–1.0)
pH: 6 (ref 5.0–8.0)

## 2013-09-18 LAB — URINE MICROSCOPIC-ADD ON

## 2013-09-18 LAB — TROPONIN I: Troponin I: <0.3 ng/mL (ref <0.30)

## 2013-09-18 MED ORDER — SODIUM CHLORIDE 0.9 % IV BOLUS (SEPSIS)
500.0000 mL | Freq: Once | INTRAVENOUS | Status: AC
  Administered 2013-09-18: 500 mL via INTRAVENOUS

## 2013-09-18 MED ORDER — CEPHALEXIN 500 MG PO CAPS: 500.0000 mg | ORAL_CAPSULE | Freq: Four times a day (QID) | ORAL | Status: DC

## 2013-09-18 MED ORDER — DEXTROSE 5 % IV SOLN
1.0000 g | Freq: Once | INTRAVENOUS | Status: AC
  Administered 2013-09-18: 1 g via INTRAVENOUS

## 2013-09-18 MED ORDER — CEFTRIAXONE SODIUM 1 G IJ SOLR
INTRAMUSCULAR | Status: AC
  Filled 2013-09-18: qty 10

## 2013-09-18 NOTE — ED Notes (Signed)
Pt will be discharged after iv rocephin infused.

## 2013-09-18 NOTE — ED Notes (Signed)
Patient transported from X-ray 

## 2013-09-18 NOTE — ED Notes (Signed)
Patient transported to X-ray 

## 2013-09-18 NOTE — ED Provider Notes (Addendum)
CSN: [id number]     Arrival date & time 09/18/13  1539 History  This chart was scribed for Hilario Quarry, MD by Quintella Reichert, ED scribe.  This patient was seen in room MH10/MH10 and the patient's care was started at 4:12 PM.  Chief Complaint  Patient presents with  . Fatigue  . Shortness of Breath    The history is provided by the patient, a relative and medical records. No language interpreter was used.    HPI Comments: Brent Mula. is a 79 y.o. male with h/o bladder cancer, UTI, pancreatitis, A-fib, HTN, osteopenia and secondary psychotic disorder of other type with hallucinations sent by PCP to the Emergency Department for evaluation of urosepsis vs dehydration. Daughter reports that for the past week pt has developed moderate exacerbation of his chronic generalized weakness, SOB with ambulation, and decreased appetite.  He was seen by his PA today for a regular check-up and advised to come to the ED.  Daughter also states pt has had hallucinations. He denies lightheadedness, nausea, vomiting, fevers, or chills.  Pt has a foley catheter in place and has been producing urine regularly.  He has a recent h/o UTI and was placed on antibiotics and a UA taken 5 days ago was negative for bacteria.  Daughter also reports his blood sugar has been high lately although he has no h/o DM diagnosis.  Pt lives at home with his daughter.  He is ambulatory with a walker.  PCP is Dr. Oneta Rack   Past Medical History  Diagnosis Date  . Pancreatitis   . Fractured hip 09/2011  . Prostate tumor     to have bx by Dr. Margarita Grizzle  . UTI (lower urinary tract infection) 06/13/2012    has had x 3 months  . Falls 06/13/12    hit head approx. 1 month ago  . Unsteady gait 06/13/2012    "bad for awhile but getting worse recently"  . Slurred speech     slurred speech on 06/12/12 - didn't last long, slower talking recently  . Cancer     bladder  . Lung abnormality     spot on ct approx 3 months ago - to be  followed - [facility]  . 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 Nose fracture     after a fall - Oct. 2012  . Hypertension   . Dysrhythmia     history of a-fib  - followed by Dr. Ladona Ridgel   . Osteopenia   . Fractures, stress     spine  . 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 Hallucinations     will hospitalized -  . Foley catheter in place   . Anxiety   . Ulcer of ankle     rt  . Atrial fibrillation     Past Surgical History  Procedure Laterality Date  . Cholecystectomy    . Partial hip arthroplasty    . Cystoscopy      has yearly due to bladder cancer  . Retinal detachment surgery      at least 3 and have been in both eyes  . Transurethral resection of bladder tumor    . Transurethral resection of prostate  01/18/2013    Procedure: TRANSURETHRAL RESECTION OF THE PROSTATE WITH GYRUS INSTRUMENTS;  Surgeon: Milford Cage, MD;  Location: [facility];  Service: Urology;  Laterality: N/A;  CYSTOSCOPY, SUPRAPUBIC TUBE PLACEMENT, CHANNEL TURP, RECTAL  EXAM    . Insertion of suprapubic catheter  01/18/2013    Procedure: INSERTION OF SUPRAPUBIC CATHETER;  Surgeon: Milford Cage, MD;  Location: [facility];  Service: Urology;  Laterality: N/A;  . Cystoscopy  01/18/2013    Procedure: CYSTOSCOPY;  Surgeon: Milford Cage, MD;  Location: [facility];  Service: Urology;  Laterality: N/A;    Family History  Problem Relation Age of Onset  . Heart disease      No family history    History  Substance Use Topics  . Smoking status: Current Some Day Smoker -- 0.25 packs/day for 62 years    Types: Cigarettes  . Smokeless tobacco: Never Used  . Alcohol Use: No     Review of Systems  Constitutional: Positive for appetite change and fatigue. Negative for fever and chills.  Respiratory: Positive for shortness of breath.   Gastrointestinal: Negative for nausea and vomiting.  Neurological: Positive for weakness. Negative for  light-headedness.  Psychiatric/Behavioral: Positive for hallucinations.  All other systems reviewed and are negative.     Allergies  Review of patient's allergies indicates no known allergies.  Home Medications   Current Outpatient Rx  Name  Route  Sig  Dispense  Refill  . acetaminophen (TYLENOL 8 HOUR) 650 MG CR tablet   Oral   Take 650 mg by mouth every 8 (eight) hours as needed for pain.         Marland Kitchen b complex vitamins tablet   Oral   Take 1 tablet by mouth daily.         . Cholecalciferol (VITAMIN D3) 2000 UNITS capsule   Oral   Take 2,000 Units by mouth daily.         . ciprofloxacin (CIPRO) 500 MG tablet   Oral   Take 500 mg by mouth 2 (two) times daily.         . Ensure Plus (ENSURE PLUS) LIQD   Oral   Take 237 mLs by mouth 2 (two) times daily between meals.         . finasteride (PROSCAR) 5 MG tablet   Oral   Take 5 mg by mouth every evening.          Marland Kitchen gemfibrozil (LOPID) 600 MG tablet   Oral   Take 600 mg by mouth 2 (two) times daily before a meal.         . HYDROcodone-acetaminophen (NORCO) 10-325 MG per tablet   Oral   Take 1 tablet by mouth every 4 (four) hours as needed for pain.         . hyoscyamine (LEVSIN, ANASPAZ) 0.125 MG tablet   Oral   Take 1 tablet (0.125 mg total) by mouth every 4 (four) hours as needed for cramping (bladder spasms).   40 tablet   4   . LORazepam (ATIVAN) 1 MG tablet   Oral   Take 1 mg by mouth 3 (three) times daily as needed. As needed for anxiety  Or sleep         . metoprolol tartrate (LOPRESSOR) 25 MG tablet   Oral   Take 25 mg by mouth 2 (two) times daily.         . mirabegron ER (MYRBETRIQ) 25 MG TB24 tablet   Oral   Take 25 mg by mouth daily.         . mirtazapine (REMERON) 30 MG tablet   Oral   Take 30 mg by mouth at bedtime.         Marland Kitchen  mupirocin ointment (BACTROBAN) 2 %   Topical   Apply topically 2 (two) times daily. Apply to the nose until complete.   22 g   0   .  neomycin-bacitracin-polymyxin (NEOSPORIN) ointment   Topical   Apply 1 application topically 3 (three) times daily as needed (catheter irritation). apply to tip of penis   15 g   3   . oxyCODONE (OXY IR/ROXICODONE) 5 MG immediate release tablet   Oral   Take 1-2 tablets (5-10 mg total) by mouth every 4 (four) hours as needed.   20 tablet   0   . saccharomyces boulardii (FLORASTOR) 250 MG capsule   Oral   Take 250 mg by mouth 2 (two) times daily.          Marland Kitchen senna-docusate (SENOKOT-S) 8.6-50 MG per tablet   Oral   Take 1 tablet by mouth 2 (two) times daily.   60 tablet   0    BP 103/84  Pulse 129  Temp(Src) 97.8 F (36.6 C) (Oral)  Resp 16  Wt 146 lb (66.225 kg)  BMI 22.52 kg/m2  SpO2 99%  Physical Exam  Nursing note and vitals reviewed. Constitutional: He is oriented to person, place, and time. He appears well-developed and well-nourished.  HENT:  Head: Normocephalic and atraumatic.  Right Ear: External ear normal.  Left Ear: External ear normal.  Nose: Nose normal.  Mouth/Throat: Oropharynx is clear and moist.  Mucous membranes slightly dry  Eyes: Conjunctivae and EOM are normal. Pupils are equal, round, and reactive to light.  Neck: Normal range of motion. Neck supple. No JVD present. No tracheal deviation present.  Cardiovascular: Normal rate, regular rhythm, normal heart sounds and intact distal pulses.   Pulmonary/Chest: Effort normal and breath sounds normal. No respiratory distress. He has no wheezes. He exhibits no tenderness.  Lungs clear to auscultation in posterior fields  Abdominal: Soft. Bowel sounds are normal. He exhibits no distension and no mass. There is no tenderness. There is no guarding.  No CVA tenderness bilaterally Suprapubic catheter in place  Musculoskeletal: Normal range of motion.  Neurological: He is alert and oriented to person, place, and time. He has normal reflexes. He exhibits normal muscle tone. Coordination normal.  Good  bilateral lower extremity strength  Skin: Skin is warm and dry.  Psychiatric: He has a normal mood and affect. His behavior is normal. Judgment and thought content normal.    ED Course  Procedures (including critical care time)  DIAGNOSTIC STUDIES: Oxygen Saturation is 99% on room air, normal by my interpretation.    COORDINATION OF CARE: 4:20 PM-Discussed treatment plan which includes EKG, labs and imaging with pt at bedside and pt agreed to plan.    Labs Review Labs Reviewed  CBC WITH DIFFERENTIAL - Abnormal; Notable for the following:    WBC 11.6 (*)    RBC 4.03 (*)    Hemoglobin 12.8 (*)    HCT 38.7 (*)    Neutrophils Relative % 81 (*)    Neutro Abs 9.4 (*)    Lymphocytes Relative 11 (*)    All other components within normal limits  COMPREHENSIVE METABOLIC PANEL - Abnormal; Notable for the following:    Sodium 134 (*)    BUN 24 (*)    Creatinine, Ser 1.40 (*)    GFR calc non Af Amer 45 (*)    GFR calc Af Amer 52 (*)    All other components within normal limits  URINALYSIS, ROUTINE W REFLEX MICROSCOPIC -  Abnormal; Notable for the following:    Leukocytes, UA LARGE (*)    All other components within normal limits  URINE MICROSCOPIC-ADD ON - Abnormal; Notable for the following:    Bacteria, UA FEW (*)    All other components within normal limits  URINE CULTURE  TROPONIN I    Imaging Review Dg Chest 2 View  09/18/2013   CLINICAL DATA:  Urosepsis.  EXAM: CHEST  2 VIEW  COMPARISON:  11/24/2012.  FINDINGS: The cardiac silhouette, mediastinal and hilar contours are within normal limits and stable. There is tortuosity and calcification of the thoracic aorta. Chronic lung changes but no acute pulmonary findings. No pleural effusion.  IMPRESSION: Chronic lung changes but no acute pulmonary findings.   Electronically Signed   By: Loralie Champagne M.D.   On: 09/18/2013 17:57   Results for orders placed during the hospital encounter of 09/18/13  CBC WITH DIFFERENTIAL      Result  Value Range   WBC 11.6 (*) 4.0 - 10.5 K/uL   RBC 4.03 (*) 4.22 - 5.81 MIL/uL   Hemoglobin 12.8 (*) 13.0 - 17.0 g/dL   HCT 16.1 (*) 09.6 - 04.5 %   MCV 96.0  78.0 - 100.0 fL   MCH 31.8  26.0 - 34.0 pg   MCHC 33.1  30.0 - 36.0 g/dL   RDW 40.9  81.1 - 91.4 %   Platelets 262  150 - 400 K/uL   Neutrophils Relative % 81 (*) 43 - 77 %   Neutro Abs 9.4 (*) 1.7 - 7.7 K/uL   Lymphocytes Relative 11 (*) 12 - 46 %   Lymphs Abs 1.3  0.7 - 4.0 K/uL   Monocytes Relative 6  3 - 12 %   Monocytes Absolute 0.7  0.1 - 1.0 K/uL   Eosinophils Relative 1  0 - 5 %   Eosinophils Absolute 0.2  0.0 - 0.7 K/uL   Basophils Relative 0  0 - 1 %   Basophils Absolute 0.0  0.0 - 0.1 K/uL  COMPREHENSIVE METABOLIC PANEL      Result Value Range   Sodium 134 (*) 135 - 145 mEq/L   Potassium 4.6  3.5 - 5.1 mEq/L   Chloride 99  96 - 112 mEq/L   CO2 24  19 - 32 mEq/L   Glucose, Bld 92  70 - 99 mg/dL   BUN 24 (*) 6 - 23 mg/dL   Creatinine, Ser 7.82 (*) 0.50 - 1.35 mg/dL   Calcium 9.9  8.4 - 95.6 mg/dL   Total Protein 7.4  6.0 - 8.3 g/dL   Albumin 3.7  3.5 - 5.2 g/dL   AST 20  0 - 37 U/L   ALT 16  0 - 53 U/L   Alkaline Phosphatase 91  39 - 117 U/L   Total Bilirubin 0.4  0.3 - 1.2 mg/dL   GFR calc non Af Amer 45 (*) >90 mL/min   GFR calc Af Amer 52 (*) >90 mL/min  URINALYSIS, ROUTINE W REFLEX MICROSCOPIC      Result Value Range   Color, Urine YELLOW  YELLOW   APPearance CLEAR  CLEAR   Specific Gravity, Urine 1.008  1.005 - 1.030   pH 6.0  5.0 - 8.0   Glucose, UA NEGATIVE  NEGATIVE mg/dL   Hgb urine dipstick NEGATIVE  NEGATIVE   Bilirubin Urine NEGATIVE  NEGATIVE   Ketones, ur NEGATIVE  NEGATIVE mg/dL   Protein, ur NEGATIVE  NEGATIVE mg/dL   Urobilinogen, UA  0.2  0.0 - 1.0 mg/dL   Nitrite NEGATIVE  NEGATIVE   Leukocytes, UA LARGE (*) NEGATIVE  TROPONIN I      Result Value Range   Troponin I <0.30  <0.30 ng/mL  URINE MICROSCOPIC-ADD ON      Result Value Range   Squamous Epithelial / LPF RARE  RARE   WBC,  UA 21-50  <3 WBC/hpf   RBC / HPF 0-2  <3 RBC/hpf   Bacteria, UA FEW (*) RARE    Date: 09/18/2013  Rate: 81  Rhythm: normal sinus rhythm  QRS Axis: normal  Intervals: normal  ST/T Wave abnormalities: normal  Conduction Disutrbances:pacs  Narrative Interpretation:   Old EKG Reviewed: unchanged   MDM  No diagnosis found. Patient remains hemodynamically stable.  Labs consistent with uti.  Daughter states last uti end of August. Rocephin one gram iv given here and keflex started pending culture results.  I have discussed follow up with daughter and she will call md tomorrow for recheck.  Return precautions including ams, fever, vomiting or other worsening discussed.   Patient's creatinine slightly elevated at 1.4 with last at 1.31- he was given some fluid bolus here and is advised follow up and push oral fluids.  Hilario Quarry, MD 09/18/13 1610  Hilario Quarry, MD 09/18/13 [phone]

## 2013-09-18 NOTE — ED Notes (Signed)
Pt sent to ED for evaluation of urosepsis vs dehydration by PMD.  Pt has a 1 week history of generalized weakness and SHOB with ambulation.

## 2013-09-19 LAB — URINE CULTURE

## 2013-10-05 ENCOUNTER — Other Ambulatory Visit (HOSPITAL_COMMUNITY): Payer: Self-pay | Admitting: Internal Medicine

## 2013-10-05 ENCOUNTER — Ambulatory Visit (HOSPITAL_COMMUNITY)
Admission: RE | Admit: 2013-10-05 | Discharge: 2013-10-05 | Disposition: A | Discharge status: Home / Self Care | Payer: Medicare Other | Source: Ambulatory Visit | Attending: Internal Medicine | Admitting: Internal Medicine

## 2013-10-05 DIAGNOSIS — Z8546 Personal history of malignant neoplasm of prostate: Secondary | ICD-10-CM | POA: Insufficient documentation

## 2013-10-05 DIAGNOSIS — I700 Atherosclerosis of aorta: Secondary | ICD-10-CM | POA: Insufficient documentation

## 2013-10-05 DIAGNOSIS — M899 Disorder of bone, unspecified: Secondary | ICD-10-CM | POA: Insufficient documentation

## 2013-10-05 DIAGNOSIS — M898X9 Other specified disorders of bone, unspecified site: Secondary | ICD-10-CM

## 2013-10-05 DIAGNOSIS — M47817 Spondylosis without myelopathy or radiculopathy, lumbosacral region: Secondary | ICD-10-CM | POA: Insufficient documentation

## 2013-10-05 DIAGNOSIS — Q762 Congenital spondylolisthesis: Secondary | ICD-10-CM | POA: Insufficient documentation

## 2013-10-17 ENCOUNTER — Encounter: Payer: Self-pay | Admitting: Internal Medicine

## 2013-10-18 ENCOUNTER — Encounter: Payer: Self-pay | Admitting: Internal Medicine

## 2013-10-25 ENCOUNTER — Encounter: Payer: Self-pay | Admitting: Internal Medicine

## 2013-10-25 ENCOUNTER — Ambulatory Visit: Payer: Medicare Other | Admitting: Internal Medicine

## 2013-10-25 VITALS — BP 128/62 | HR 80 | Temp 97.2°F | Resp 18 | Wt 145.6 lb

## 2013-10-25 DIAGNOSIS — R7309 Other abnormal glucose: Secondary | ICD-10-CM

## 2013-10-25 DIAGNOSIS — I10 Essential (primary) hypertension: Secondary | ICD-10-CM

## 2013-10-25 DIAGNOSIS — Z79899 Other long term (current) drug therapy: Secondary | ICD-10-CM

## 2013-10-25 LAB — BASIC METABOLIC PANEL
BUN: 28 mg/dL — ABNORMAL HIGH (ref 6–23)
CO2: 25 mEq/L (ref 19–32)
Calcium: 9.9 mg/dL (ref 8.4–10.5)
Creat: 1.44 mg/dL — ABNORMAL HIGH (ref 0.50–1.35)
Glucose, Bld: 109 mg/dL — ABNORMAL HIGH (ref 70–99)
Potassium: 5.8 mEq/L — ABNORMAL HIGH (ref 3.5–5.3)
Sodium: 135 mEq/L (ref 135–145)

## 2013-10-25 NOTE — Progress Notes (Signed)
Patient ID: Brent Day., male   DOB: 1927/08/31, 79 y.o.   MRN: [id number]  Patient has multiple problems including HTN, Hyperlipidemia, Prediabetes, ASHD with Hx/o A.Fib and also has Hx/o Both bladder and prostate cancers And is followed by Dr. Margarita Grizzle who he is scheduled to see in the morning. Patient is living with daughter. Patient denies any hypertensive, cardiac, respiratory Sx's. Continues suprapubic bladder irrigations. Daughter occasionally monitors random glucoses which are usually below 120 mg%.He denies any diabetic polys, paresthesias or visual blurring.

## 2013-10-25 NOTE — Patient Instructions (Signed)
Continue meds same pending lab results

## 2013-10-26 ENCOUNTER — Telehealth: Payer: Self-pay | Admitting: *Deleted

## 2013-10-26 NOTE — Telephone Encounter (Signed)
lmom for daughter to call regardinglab

## 2013-10-26 NOTE — Telephone Encounter (Signed)
Daughter aware of labs.  Mailed potassium list to pt.

## 2013-10-26 NOTE — Telephone Encounter (Signed)
Message copied by Reggy Eye on Thu Oct 26, 2013  3:11 PM ------      Message from: Lucky Cowboy      Created: Thu Oct 26, 2013 12:08 AM       Call daughter - tell kidney functions stable - still encourage liberal fluid intake - K + / potassium is high - avoid any potassium supplements & send list of high potassium foods to avoid ------

## 2013-12-05 ENCOUNTER — Ambulatory Visit (INDEPENDENT_AMBULATORY_CARE_PROVIDER_SITE_OTHER): Payer: Medicare Other | Admitting: Internal Medicine

## 2013-12-05 ENCOUNTER — Encounter: Payer: Self-pay | Admitting: Internal Medicine

## 2013-12-05 ENCOUNTER — Ambulatory Visit: Payer: Self-pay | Admitting: Internal Medicine

## 2013-12-05 VITALS — BP 140/62 | HR 80 | Temp 97.8°F | Resp 18 | Wt 150.0 lb

## 2013-12-05 DIAGNOSIS — R7309 Other abnormal glucose: Secondary | ICD-10-CM

## 2013-12-05 DIAGNOSIS — N3000 Acute cystitis without hematuria: Secondary | ICD-10-CM

## 2013-12-05 DIAGNOSIS — E559 Vitamin D deficiency, unspecified: Secondary | ICD-10-CM

## 2013-12-05 DIAGNOSIS — Z79899 Other long term (current) drug therapy: Secondary | ICD-10-CM

## 2013-12-05 DIAGNOSIS — I10 Essential (primary) hypertension: Secondary | ICD-10-CM

## 2013-12-05 DIAGNOSIS — E782 Mixed hyperlipidemia: Secondary | ICD-10-CM

## 2013-12-05 LAB — BASIC METABOLIC PANEL WITH GFR
Calcium: 9.3 mg/dL (ref 8.4–10.5)
Chloride: 104 mEq/L (ref 96–112)
Creat: 1.3 mg/dL (ref 0.50–1.35)
GFR, Est Non African American: 50 mL/min — ABNORMAL LOW
Potassium: 5.1 mEq/L (ref 3.5–5.3)

## 2013-12-05 LAB — CBC WITH DIFFERENTIAL/PLATELET
Basophils Absolute: 0.1 10*3/uL (ref 0.0–0.1)
Eosinophils Absolute: 0.1 10*3/uL (ref 0.0–0.7)
Eosinophils Relative: 1 % (ref 0–5)
Lymphocytes Relative: 8 % — ABNORMAL LOW (ref 12–46)
MCH: 31.4 pg (ref 26.0–34.0)
MCV: 92.7 fL (ref 78.0–100.0)
Neutrophils Relative %: 87 % — ABNORMAL HIGH (ref 43–77)
Platelets: 296 10*3/uL (ref 150–400)
RDW: 15.5 % (ref 11.5–15.5)
WBC: 13 10*3/uL — ABNORMAL HIGH (ref 4.0–10.5)

## 2013-12-05 LAB — MAGNESIUM: Magnesium: 2 mg/dL (ref 1.5–2.5)

## 2013-12-05 LAB — LIPID PANEL
HDL: 58 mg/dL (ref >39)
LDL Cholesterol: 75 mg/dL (ref 0–99)
Total CHOL/HDL Ratio: 2.9 Ratio
Triglycerides: 187 mg/dL — ABNORMAL HIGH (ref <150)

## 2013-12-05 LAB — HEPATIC FUNCTION PANEL
Albumin: 4 g/dL (ref 3.5–5.2)
Alkaline Phosphatase: 76 U/L (ref 39–117)
Total Bilirubin: 0.5 mg/dL (ref 0.3–1.2)

## 2013-12-05 NOTE — Progress Notes (Signed)
Patient ID: Brent Day., male   DOB: 03-Oct-1927, 79 y.o.   MRN: [id number]   This very nice 79 yo MWM with multiple problems including HTN, Hyperlipidemia, Prediabetes, ASHD with Hx/o A.Fib and also has Hx/o Both bladder and prostate cancers who  presents for follow up with Hypertension, Hyperlipidemia, Pre-Diabetes and Vitamin D Deficiency.    BP has been controlled at home. Today's BP is    . Patient denies any cardiac type chest pain, palpitations, dyspnea/orthopnea/PND, dizziness, claudication, or dependent edema.   Hyperlipidemia is controlled with diet & meds. Last Cholesterol was  , Triglycerides were    , HDL   , and LDL    . Patient denies myalgias or other med SE's.    Also, the patient has history of PreDiabetes/insulin resistance with last A1c of    . Patient denies any symptoms of reactive hypoglycemia, diabetic polys, paresthesias or visual blurring.   Further, Patient has history of Vitamin D Deficiency with last vitamin D of   . Patient supplements vitamin D without any suspected side-effects.  Current Outpatient Prescriptions on File Prior to Visit  Medication Sig Dispense Refill  . acetaminophen (TYLENOL 8 HOUR) 650 MG CR tablet Take 650 mg by mouth every 8 (eight) hours as needed for pain.      Marland Kitchen b complex vitamins tablet Take 1 tablet by mouth daily.      . Cholecalciferol (VITAMIN D3) 2000 UNITS capsule Take 4,000 Units by mouth daily.       Marland Kitchen docusate sodium (COLACE) 100 MG capsule Take 200 mg by mouth 2 (two) times daily.      . Ensure Plus (ENSURE PLUS) LIQD Take 237 mLs by mouth 2 (two) times daily between meals.      Marland Kitchen HYDROcodone-acetaminophen (NORCO) 10-325 MG per tablet Take 1 tablet by mouth every 4 (four) hours as needed for pain.      Marland Kitchen LORazepam (ATIVAN) 1 MG tablet Take 1 mg by mouth 3 (three) times daily as needed. As needed for anxiety  Or sleep      . metoprolol tartrate (LOPRESSOR) 25 MG tablet Take 25 mg by mouth 2 (two) times daily.      .  mirtazapine (REMERON) 30 MG tablet Take 15 mg by mouth at bedtime.       . mupirocin ointment (BACTROBAN) 2 % Apply topically 2 (two) times daily. Apply to the nose until complete.  22 g  0  . neomycin-bacitracin-polymyxin (NEOSPORIN) ointment Apply 1 application topically 3 (three) times daily as needed (catheter irritation). apply to tip of penis  15 g  3  . oxyCODONE (OXY IR/ROXICODONE) 5 MG immediate release tablet Take 1-2 tablets (5-10 mg total) by mouth every 4 (four) hours as needed.  20 tablet  0  . Probiotic Product (PROBIOTIC DAILY PO) Take 1 capsule by mouth 1 day or 1 dose.      . saccharomyces boulardii (FLORASTOR) 250 MG capsule Take 250 mg by mouth 2 (two) times daily.       Marland Kitchen senna-docusate (SENOKOT-S) 8.6-50 MG per tablet Take 1 tablet by mouth 2 (two) times daily.  60 tablet  0   No current facility-administered medications on file prior to visit.     No Known Allergies  PMHx:   Past Medical History  Diagnosis Date  . Pancreatitis   . Fractured hip 09/2011  . Prostate tumor     to have bx by Dr. Margarita Grizzle  . UTI (lower urinary tract  infection) 06/13/2012    has had x 3 months  . Falls 06/13/12    hit head approx. 1 month ago  . Unsteady gait 06/13/2012    "bad for awhile but getting worse recently"  . Slurred speech     slurred speech on 06/12/12 - didn't last long, slower talking recently  . Cancer     bladder  . Lung abnormality     spot on ct approx 3 months ago - to be followed - [facility]  . 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 Nose fracture     after a fall - Oct. 2012  . Hypertension   . Dysrhythmia     history of a-fib  - followed by Dr. Ladona Ridgel   . Osteopenia   . Fractures, stress     spine  . 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 Hallucinations     will hospitalized -  . Day catheter in place   . Anxiety   . Ulcer of ankle     rt  . Atrial fibrillation     FHx:    Reviewed / unchanged  SHx:     Reviewed / unchanged  Systems Review: Constitutional: Denies fever, chills, wt changes, headaches, insomnia, fatigue, night sweats, change in appetite. Eyes: Denies redness, blurred vision, diplopia, discharge, itchy, watery eyes.  ENT: Denies discharge, congestion, post nasal drip, epistaxis, sore throat, earache, hearing loss, dental pain, tinnitus, vertigo, sinus pain, snoring.  CV: Denies chest pain, palpitations, irregular heartbeat, syncope, dyspnea, diaphoresis, orthopnea, PND, claudication, edema. Respiratory: denies cough, dyspnea, DOE, pleurisy, hoarseness, laryngitis, wheezing.  Gastrointestinal: Denies dysphagia, odynophagia, heartburn, reflux, water brash, abdominal pain or cramps, nausea, vomiting, bloating, diarrhea, constipation, hematemesis, melena, hematochezia,  or hemorrhoids. Genitourinary: Denies dysuria, frequency, urgency, nocturia, hesitancy, discharge, hematuria, flank pain. Musculoskeletal: Denies arthralgias, myalgias, stiffness, jt. swelling, pain, limp, strain/sprain.  Skin: Denies pruritus, rash, hives, warts, acne, eczema, change in skin lesion(s). Neuro: No weakness, tremor, incoordination, spasms, paresthesia, or pain. Psychiatric: Denies confusion, memory loss, or sensory loss. Endo: Denies change in weight, skin, hair change.  Heme/Lymph: No excessive bleeding, bruising, orenlarged lymph nodes.  There were no vitals filed for this visit.  Estimated body mass index is 22.45 kg/(m^2) as calculated from the following:   Height as of 08/15/13: 5' 7.5" (1.715 m).   Weight as of 10/25/13: 145 lb 9.6 oz (66.044 kg).  On Exam: Appears well nourished - in no distress. Eyes: PERRLA, EOMs, conjunctiva no swelling or erythema. Sinuses: No frontal/maxillary tenderness ENT/Mouth: EAC's clear, TM's nl w/o erythema, bulging. Nares clear w/o erythema, swelling, exudates. Oropharynx clear without erythema or exudates. Oral hygiene is good. Tongue normal, non obstructing.  Hearing intact.  Neck: Supple. Thyroid nl. Car 2+/2+ without bruits, nodes or JVD. Chest: Respirations nl with BS clear & equal w/o rales, rhonchi, wheezing or stridor.  Cor: Heart sounds normal w/ regular rate and rhythm without sig. murmurs, gallops, clicks, or rubs. Peripheral pulses normal and equal  without edema.  Abdomen: Soft & bowel sounds normal. Non-tender w/o guarding, rebound, hernias, masses, or organomegaly.  Lymphatics: Unremarkable.  Musculoskeletal: Full ROM all peripheral extremities, joint stability, 5/5 strength, and normal gait.  Skin: Warm, dry without exposed rashes, lesions, ecchymosis apparent.  Neuro: Cranial nerves intact, reflexes equal bilaterally. Sensory-motor testing grossly intact. Tendon reflexes grossly intact.  Pysch: Alert & oriented x 3. Insight and judgement nl & appropriate. No ideations.  Assessment and Plan:  1. Hypertension - Continue monitor blood pressure at home. Continue diet/meds same.  2. Hyperlipidemia - Continue diet/meds, exercise,& lifestyle modifications. Continue monitor periodic cholesterol/liver & renal functions   3. Pre-diabetes/Insulin Resistance - Continue diet, exercise, lifestyle modifications. Monitor appropriate labs.  4. ASHD/ PAfib  5. Hx/o Prostate and Bladder Cancers   6. Vitamin D Deficiency - Continue supplementation.  Recommended regular exercise, BP monitoring, weight control, and discussed med and SE's. Recommended labs to assess and monitor clinical status. Further disposition pending results of labs.

## 2013-12-05 NOTE — Progress Notes (Signed)
Patient ID: Huston Foley., male   DOB: 20-Oct-1927, 79 y.o.   MRN: [id number]   This very nice 79 yo MWM currently living with and cared for by one of his daughters and whopresents for 3 month follow up with Hypertension, ASHD / pAfib, Hyperlipidemia, Pre-Diabetes, prostate and Bladder Cancers  and Vitamin D Deficiency.    BP has been controlled at home. Today's BP is 140/62. Patient denies any cardiac type chest pain, palpitations, dyspnea/orthopnea/PND, dizziness, claudication, or dependent edema.   Hyperlipidemia is controlled with diet. Last Cholesterol was  181, Triglycerides were 151, HDL 48 and LDL 103. Patient denies myalgias or other med SE's.    Also, the patient has history of PreDiabetes/insulin resistance with last A1c of 5.7% in August.  Patient has been treated with Megace, Remeron and low dose prednisone and supplemented with Ensure because of his cachexia felt secondary to his cancers.  Patient denies any symptoms of reactive hypoglycemia, diabetic polys, paresthesias or visual blurring.   With regards to his Prostate and Bladder Cancers - they are felt to be in remission at present. Recently his wife was admitted to a NH as she is apparently totally bed bound and requires total care in all ADL's.   Further, Patient has history of Vitamin D Deficiency with last vitamin D of 65 in August. Patient supplements vitamin D without any suspected side-effects.  Medication Sig Dispense Refill  . acetaminophen (TYLENOL 8 HOUR) 650 MG CR tablet Take 650 mg by mouth every 8 (eight) hours as needed for pain.      Marland Kitchen b complex vitamins tablet Take 1 tablet by mouth daily.      . Cholecalciferol (VITAMIN D3) 2000 UNITS capsule Take 4,000 Units by mouth daily.       Marland Kitchen docusate sodium (COLACE) 100 MG capsule Take 200 mg by mouth 2 (two) times daily.      . Ensure Plus (ENSURE PLUS) LIQD Take 237 mLs by mouth 2 (two) times daily between meals.      Marland Kitchen HYDROcodone-acetaminophen (NORCO) 10-325 MG  per tablet Take 1 tablet by mouth every 4 (four) hours as needed for pain.      Marland Kitchen LORazepam (ATIVAN) 1 MG tablet Take 1 mg by mouth 3 (three) times daily as needed. As needed for anxiety  Or sleep      . megestrol (MEGACE) 40 MG tablet       . metoprolol tartrate (LOPRESSOR) 25 MG tablet Take 25 mg by mouth 2 (two) times daily.      . mirtazapine (REMERON) 30 MG tablet Take 15 mg by mouth at bedtime.       Marland Kitchen MYRBETRIQ 25 MG TB24 tablet       . oxybutynin (DITROPAN) 5 MG tablet       . predniSONE (DELTASONE) 10 MG tablet 10 mg daily.       . Probiotic Product (PROBIOTIC DAILY PO) Take 1 capsule by mouth 1 day or 1 dose.      . sertraline (ZOLOFT) 50 MG tablet Takes 1 daily          No Known Allergies  PMHx:   Past Medical History  Diagnosis Date  . Pancreatitis   . Fractured hip 09/2011  . Prostate tumor     to have bx by Dr. Margarita Grizzle  . UTI (lower urinary tract infection) 06/13/2012    has had x 3 months  . Falls 06/13/12    hit head approx. 1 month ago  .  Unsteady gait 06/13/2012    "bad for awhile but getting worse recently"  . Slurred speech     slurred speech on 06/12/12 - didn't last long, slower talking recently  . Cancer     bladder  . Lung abnormality     spot on ct approx 3 months ago - to be followed - [facility]  . 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 Nose fracture     after a fall - Oct. 2012  . Hypertension   . Dysrhythmia     history of a-fib  - followed by Dr. Ladona Ridgel   . Osteopenia   . Fractures, stress     spine  . 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 Hallucinations     will hospitalized -  . Foley catheter in place   . Anxiety   . Ulcer of ankle     rt  . Atrial fibrillation     FHx:    Reviewed / unchanged  SHx:    Reviewed / unchanged  Systems Review: Constitutional: Denies fever, chills, wt changes, headaches, insomnia, fatigue, night sweats, change in appetite. Eyes: Denies redness, blurred  vision, diplopia, discharge, itchy, watery eyes.  ENT: Denies discharge, congestion, post nasal drip, epistaxis, sore throat, earache, hearing loss, dental pain, tinnitus, vertigo, sinus pain, snoring.  CV: Denies chest pain, palpitations, irregular heartbeat, syncope, dyspnea, diaphoresis, orthopnea, PND, claudication, edema. Respiratory: denies cough, dyspnea, DOE, pleurisy, hoarseness, laryngitis, wheezing.  Gastrointestinal: Denies dysphagia, odynophagia, heartburn, reflux, water brash, abdominal pain or cramps, nausea, vomiting, bloating, diarrhea, constipation, hematemesis, melena, hematochezia,  or hemorrhoids. Genitourinary: Denies dysuria, frequency, urgency, nocturia, hesitancy, discharge, hematuria, flank pain. Musculoskeletal: Denies arthralgias, myalgias, stiffness, jt. swelling, pain, limp, strain/sprain.  Skin: Denies pruritus, rash, hives, warts, acne, eczema, change in skin lesion(s). Neuro: No weakness, tremor, incoordination, spasms, paresthesia, or pain. Psychiatric: Denies confusion, memory loss, or sensory loss. Endo: Denies change in weight, skin, hair change.  Heme/Lymph: No excessive bleeding, bruising, orenlarged lymph nodes.  Filed Vitals:   12/05/13 1608  BP: 140/62  Pulse: 80  Temp: 97.8 F (36.6 C)  Resp: 18    Estimated body mass index is 23.13 kg/(m^2) as calculated from the following:   Height as of 08/15/13: 5' 7.5" (1.715 m).   Weight as of this encounter: 150 lb (68.04 kg).  On Exam: Appears well nourished - in no distress. Eyes: PERRLA, EOMs, conjunctiva no swelling or erythema. Sinuses: No frontal/maxillary tenderness ENT/Mouth: EAC's clear, TM's nl w/o erythema, bulging. Nares clear w/o erythema, swelling, exudates. Oropharynx clear without erythema or exudates. Oral hygiene is good. Tongue normal, non obstructing. Hearing intact.  Neck: Supple. Thyroid nl. Car 2+/2+ without bruits, nodes or JVD. Chest: Respirations nl with BS clear & equal w/o  rales, rhonchi, wheezing or stridor.  Cor: Heart sounds normal w/ regular rate and rhythm without sig. murmurs, gallops, clicks, or rubs. Peripheral pulses normal and equal  without edema.  Abdomen: Supra-pubic catheter in place. Soft & bowel sounds normal. Non-tender w/o guarding, rebound, hernias, masses, or organomegaly.  Lymphatics: Unremarkable.  Musculoskeletal: Full ROM all peripheral extremities, joint stability, 5/5 strength, and unsteady broad-based gait stabilized with a walker.  Skin: Warm, dry without exposed rashes, lesions, ecchymosis apparent.  Neuro: Cranial nerves intact, reflexes equal bilaterally. Sensory-motor testing grossly intact. Tendon reflexes grossly intact.  Pysch: Alert & oriented x 3. Insight and judgement nl & appropriate. No ideations. Spirits/attitude very positive.  Assessment and Plan:  1. Hypertension - Continue monitor blood pressure at home. Continue diet/meds same.  2. Hyperlipidemia - Continue diet/meds, exercise,& lifestyle modifications. Continue monitor periodic cholesterol/liver & renal functions   3. Pre-diabetes/Insulin Resistance - Continue diet, exercise, lifestyle modifications. Monitor appropriate labs.  4. Vitamin D Deficiency - Continue supplementation.  5. Prostate and Bladder Cancers in Remission  Recommended regular exercise, BP monitoring, weight control, and discussed med and SE's. Recommended labs to assess and monitor clinical status. Further disposition pending results of labs. Also checkfor UTI

## 2013-12-05 NOTE — Patient Instructions (Signed)
  Hypertension As your heart beats, it forces blood through your arteries. This force is your blood pressure. If the pressure is too high, it is called hypertension (HTN) or high blood pressure. HTN is dangerous because you may have it and not know it. High blood pressure may mean that your heart has to work harder to pump blood. Your arteries may be narrow or stiff. The extra work puts you at risk for heart disease, stroke, and other problems.  Blood pressure consists of two numbers, a higher number over a lower, 110/72, for example. It is stated as "110 over 72." The ideal is below 120 for the top number (systolic) and under 80 for the bottom (diastolic). Write down your blood pressure today. You should pay close attention to your blood pressure if you have certain conditions such as:  Heart failure.  Prior heart attack.  Diabetes  Chronic kidney disease.  Prior stroke.  Multiple risk factors for heart disease. To see if you have HTN, your blood pressure should be measured while you are seated with your arm held at the level of the heart. It should be measured at least twice. A one-time elevated blood pressure reading (especially in the Emergency Department) does not mean that you need treatment. There may be conditions in which the blood pressure is different between your right and left arms. It is important to see your caregiver soon for a recheck. Most people have essential hypertension which means that there is not a specific cause. This type of high blood pressure may be lowered by changing lifestyle factors such as:  Stress.  Smoking.  Lack of exercise.  Excessive weight.  Drug/tobacco/alcohol use.  Eating less salt. Most people do not have symptoms from high blood pressure until it has caused damage to the body. Effective treatment can often prevent, delay or reduce that damage. TREATMENT  When a cause has been identified, treatment for high blood pressure is directed at  the cause. There are a large number of medications to treat HTN. These fall into several categories, and your caregiver will help you select the medicines that are best for you. Medications may have side effects. You should review side effects with your caregiver. If your blood pressure stays high after you have made lifestyle changes or started on medicines,   Your medication(s) may need to be changed.  Other problems may need to be addressed.  Be certain you understand your prescriptions, and know how and when to take your medicine.  Be sure to follow up with your caregiver within the time frame advised (usually within two weeks) to have your blood pressure rechecked and to review your medications.  If you are taking more than one medicine to lower your blood pressure, make sure you know how and at what times they should be taken. Taking two medicines at the same time can result in blood pressure that is too low. SEEK IMMEDIATE MEDICAL CARE IF:  You develop a severe headache, blurred or changing vision, or confusion.  You have unusual weakness or numbness, or a faint feeling.  You have severe chest or abdominal pain, vomiting, or breathing problems. MAKE SURE YOU:   Understand these instructions.  Will watch your condition.  Will get help right away if you are not doing well or get worse. Document Released: 12/07/2005 Document Revised: 02/29/2012 Document Reviewed: 07/27/2008 [employer] Patient Information 2014 [employer]   Cholesterol Cholesterol is a white, waxy, fat-like protein needed by your body   in small amounts. The liver makes all the cholesterol you need. It is carried from the liver by the blood through the blood vessels. Deposits (plaque) may build up on blood vessel walls. This makes the arteries narrower and stiffer. Plaque increases the risk for heart attack and stroke. You cannot feel your cholesterol level even if it is very high. The only way to know is by a  blood test to check your lipid (fats) levels. Once you know your cholesterol levels, you should keep a record of the test results. Work with your caregiver to to keep your levels in the desired range. WHAT THE RESULTS MEAN:  Total cholesterol is a rough measure of all the cholesterol in your blood.  LDL is the so-called bad cholesterol. This is the type that deposits cholesterol in the walls of the arteries. You want this level to be low.  HDL is the good cholesterol because it cleans the arteries and carries the LDL away. You want this level to be high.  Triglycerides are fat that the body can either burn for energy or store. High levels are closely linked to heart disease. DESIRED LEVELS:  Total cholesterol below 200.  LDL below 100 for people at risk, below 70 for very high risk.  HDL above 50 is good, above 60 is best.  Triglycerides below 150. HOW TO LOWER YOUR CHOLESTEROL:  Diet.  Choose fish or white meat chicken and turkey, roasted or baked. Limit fatty cuts of red meat, fried foods, and processed meats, such as sausage and lunch meat.  Eat lots of fresh fruits and vegetables. Choose whole grains, beans, pasta, potatoes and cereals.  Use only small amounts of olive, corn or canola oils. Avoid butter, mayonnaise, shortening or palm kernel oils. Avoid foods with trans-fats.  Use skim/nonfat milk and low-fat/nonfat yogurt and cheeses. Avoid whole milk, cream, ice cream, egg yolks and cheeses. Healthy desserts include angel food cake, ginger snaps, animal crackers, hard candy, popsicles, and low-fat/nonfat frozen yogurt. Avoid pastries, cakes, pies and cookies.  Exercise.  A regular program helps decrease LDL and raises HDL.  Helps with weight control.  Do things that increase your activity level like gardening, walking, or taking the stairs.  Medication.  May be prescribed by your caregiver to help lowering cholesterol and the risk for heart disease.  You may need  medicine even if your levels are normal if you have several risk factors. HOME CARE INSTRUCTIONS   Follow your diet and exercise programs as suggested by your caregiver.  Take medications as directed.  Have blood work done when your caregiver feels it is necessary. MAKE SURE YOU:   Understand these instructions.  Will watch your condition.  Will get help right away if you are not doing well or get worse. Document Released: 09/01/2001 Document Revised: 02/29/2012 Document Reviewed: 02/22/2008 [employer] Patient Information 2014 [employer].   Diabetes and Exercise Exercising regularly is important. It is not just about losing weight. It has many health benefits, such as:  Improving your overall fitness, flexibility, and endurance.  Increasing your bone density.  Helping with weight control.  Decreasing your body fat.  Increasing your muscle strength.  Reducing stress and tension.  Improving your overall health. People with diabetes who exercise gain additional benefits because exercise:  Reduces appetite.  Improves the body's use of blood sugar (glucose).  Helps lower or control blood glucose.  Decreases blood pressure.  Helps control blood lipids (such as cholesterol and triglycerides).  Improves the   body's use of the hormone insulin by:  Increasing the body's insulin sensitivity.  Reducing the body's insulin needs.  Decreases the risk for heart disease because exercising:  Lowers cholesterol and triglycerides levels.  Increases the levels of good cholesterol (such as high-density lipoproteins [HDL]) in the body.  Lowers blood glucose levels. YOUR ACTIVITY PLAN  Choose an activity that you enjoy and set realistic goals. Your health care provider or diabetes educator can help you make an activity plan that works for you. You can break activities into 2 or 3 sessions throughout the day. Doing so is as good as one long session. Exercise ideas  include:  Taking the dog for a walk.  Taking the stairs instead of the elevator.  Dancing to your favorite song.  Doing your favorite exercise with a friend. RECOMMENDATIONS FOR EXERCISING WITH TYPE 1 OR TYPE 2 DIABETES   Check your blood glucose before exercising. If blood glucose levels are greater than 240 mg/dL, check for urine ketones. Do not exercise if ketones are present.  Avoid injecting insulin into areas of the body that are going to be exercised. For example, avoid injecting insulin into:  The arms when playing tennis.  The legs when jogging.  Keep a record of:  Food intake before and after you exercise.  Expected peak times of insulin action.  Blood glucose levels before and after you exercise.  The type and amount of exercise you have done.  Review your records with your health care provider. Your health care provider will help you to develop guidelines for adjusting food intake and insulin amounts before and after exercising.  If you take insulin or oral hypoglycemic agents, watch for signs and symptoms of hypoglycemia. They include:  Dizziness.  Shaking.  Sweating.  Chills.  Confusion.  Drink plenty of water while you exercise to prevent dehydration or heat stroke. Body water is lost during exercise and must be replaced.  Talk to your health care provider before starting an exercise program to make sure it is safe for you. Remember, almost any type of activity is better than none. Document Released: 02/27/2004 Document Revised: 08/09/2013 Document Reviewed: 05/16/2013 [employer] Patient Information 2014 [employer].   Vitamin D Deficiency Vitamin D is an important vitamin that your body needs. Having too little of it in your body is called a deficiency. A very bad deficiency can make your bones soft and can cause a condition called rickets.  Vitamin D is important to your body for different reasons, such as:   It helps your body absorb 2  minerals called calcium and phosphorus.  It helps make your bones healthy.  It may prevent some diseases, such as diabetes and multiple sclerosis.  It helps your muscles and heart. You can get vitamin D in several ways. It is a natural part of some foods. The vitamin is also added to some dairy products and cereals. Some people take vitamin D supplements. Also, your body makes vitamin D when you are in the sun. It changes the sun's rays into a form of the vitamin that your body can use. CAUSES   Not eating enough foods that contain vitamin D.  Not getting enough sunlight.  Having certain digestive system diseases that make it hard to absorb vitamin D. These diseases include Crohn's disease, chronic pancreatitis, and cystic fibrosis.  Having a surgery in which part of the stomach or small intestine is removed.  Being obese. Fat cells pull vitamin D out of your blood.   That means that obese people may not have enough vitamin D left in their blood and in other body tissues.  Having chronic kidney or liver disease. RISK FACTORS Risk factors are things that make you more likely to develop a vitamin D deficiency. They include:  Being older.  Not being able to get outside very much.  Living in a nursing home.  Having had broken bones.  Having weak or thin bones (osteoporosis).  Having a disease or condition that changes how your body absorbs vitamin D.  Having dark skin.  Some medicines such as seizure medicines or steroids.  Being overweight or obese. SYMPTOMS Mild cases of vitamin D deficiency may not have any symptoms. If you have a very bad case, symptoms may include:  Bone pain.  Muscle pain.  Falling often.  Broken bones caused by a minor injury, due to osteoporosis. DIAGNOSIS A blood test is the best way to tell if you have a vitamin D deficiency. TREATMENT Vitamin D deficiency can be treated in different ways. Treatment for vitamin D deficiency depends on what is  causing it. Options include:  Taking vitamin D supplements.  Taking a calcium supplement. Your caregiver will suggest what dose is best for you. HOME CARE INSTRUCTIONS  Take any supplements that your caregiver prescribes. Follow the directions carefully. Take only the suggested amount.  Have your blood tested 2 months after you start taking supplements.  Eat foods that contain vitamin D. Healthy choices include:  Fortified dairy products, cereals, or juices. Fortified means vitamin D has been added to the food. Check the label on the package to be sure.  Fatty fish like salmon or trout.  Eggs.  Oysters.  Do not use a tanning bed.  Keep your weight at a healthy level. Lose weight if you need to.  Keep all follow-up appointments. Your caregiver will need to perform blood tests to make sure your vitamin D deficiency is going away. SEEK MEDICAL CARE IF:  You have any questions about your treatment.  You continue to have symptoms of vitamin D deficiency.  You have nausea or vomiting.  You are constipated.  You feel confused.  You have severe abdominal or back pain. MAKE SURE YOU:  Understand these instructions.  Will watch your condition.  Will get help right away if you are not doing well or get worse. Document Released: 02/29/2012 Document Revised: 04/03/2013 Document Reviewed: 02/29/2012 [employer] Patient Information 2014 [employer].  

## 2013-12-05 NOTE — Patient Instructions (Signed)
Continue diet & medications same as discussed.   Further disposition pending lab results.     Hypertension As your heart beats, it forces blood through your arteries. This force is your blood pressure. If the pressure is too high, it is called hypertension (HTN) or high blood pressure. HTN is dangerous because you may have it and not know it. High blood pressure may mean that your heart has to work harder to pump blood. Your arteries may be narrow or stiff. The extra work puts you at risk for heart disease, stroke, and other problems.  Blood pressure consists of two numbers, a higher number over a lower, 110/72, for example. It is stated as "110 over 72." The ideal is below 120 for the top number (systolic) and under 80 for the bottom (diastolic). Write down your blood pressure today. You should pay close attention to your blood pressure if you have certain conditions such as:  Heart failure.  Prior heart attack.  Diabetes  Chronic kidney disease.  Prior stroke.  Multiple risk factors for heart disease. To see if you have HTN, your blood pressure should be measured while you are seated with your arm held at the level of the heart. It should be measured at least twice. A one-time elevated blood pressure reading (especially in the Emergency Department) does not mean that you need treatment. There may be conditions in which the blood pressure is different between your right and left arms. It is important to see your caregiver soon for a recheck. Most people have essential hypertension which means that there is not a specific cause. This type of high blood pressure may be lowered by changing lifestyle factors such as:  Stress.  Smoking.  Lack of exercise.  Excessive weight.  Drug/tobacco/alcohol use.  Eating less salt. Most people do not have symptoms from high blood pressure until it has caused damage to the body. Effective treatment can often prevent, delay or reduce that  damage. TREATMENT  When a cause has been identified, treatment for high blood pressure is directed at the cause. There are a large number of medications to treat HTN. These fall into several categories, and your caregiver will help you select the medicines that are best for you. Medications may have side effects. You should review side effects with your caregiver. If your blood pressure stays high after you have made lifestyle changes or started on medicines,   Your medication(s) may need to be changed.  Other problems may need to be addressed.  Be certain you understand your prescriptions, and know how and when to take your medicine.  Be sure to follow up with your caregiver within the time frame advised (usually within two weeks) to have your blood pressure rechecked and to review your medications.  If you are taking more than one medicine to lower your blood pressure, make sure you know how and at what times they should be taken. Taking two medicines at the same time can result in blood pressure that is too low. SEEK IMMEDIATE MEDICAL CARE IF:  You develop a severe headache, blurred or changing vision, or confusion.  You have unusual weakness or numbness, or a faint feeling.  You have severe chest or abdominal pain, vomiting, or breathing problems. MAKE SURE YOU:   Understand these instructions.  Will watch your condition.  Will get help right away if you are not doing well or get worse. Document Released: 12/07/2005 Document Revised: 02/29/2012 Document Reviewed: 07/27/2008 [employer] Patient Information  2014 [place], [place].   Vitamin D Deficiency Vitamin D is an important vitamin that your body needs. Having too little of it in your body is called a deficiency. A very bad deficiency can make your bones soft and can cause a condition called rickets.  Vitamin D is important to your body for different reasons, such as:   It helps your body absorb 2 minerals called calcium and  phosphorus.  It helps make your bones healthy.  It may prevent some diseases, such as diabetes and multiple sclerosis.  It helps your muscles and heart. You can get vitamin D in several ways. It is a natural part of some foods. The vitamin is also added to some dairy products and cereals. Some people take vitamin D supplements. Also, your body makes vitamin D when you are in the sun. It changes the sun's rays into a form of the vitamin that your body can use. CAUSES   Not eating enough foods that contain vitamin D.  Not getting enough sunlight.  Having certain digestive system diseases that make it hard to absorb vitamin D. These diseases include Crohn's disease, chronic pancreatitis, and cystic fibrosis.  Having a surgery in which part of the stomach or small intestine is removed.  Being obese. Fat cells pull vitamin D out of your blood. That means that obese people may not have enough vitamin D left in their blood and in other body tissues.  Having chronic kidney or liver disease. RISK FACTORS Risk factors are things that make you more likely to develop a vitamin D deficiency. They include:  Being older.  Not being able to get outside very much.  Living in a nursing home.  Having had broken bones.  Having weak or thin bones (osteoporosis).  Having a disease or condition that changes how your body absorbs vitamin D.  Having dark skin.  Some medicines such as seizure medicines or steroids.  Being overweight or obese. SYMPTOMS Mild cases of vitamin D deficiency may not have any symptoms. If you have a very bad case, symptoms may include:  Bone pain.  Muscle pain.  Falling often.  Broken bones caused by a minor injury, due to osteoporosis. DIAGNOSIS A blood test is the best way to tell if you have a vitamin D deficiency. TREATMENT Vitamin D deficiency can be treated in different ways. Treatment for vitamin D deficiency depends on what is causing it. Options  include:  Taking vitamin D supplements.  Taking a calcium supplement. Your caregiver will suggest what dose is best for you. HOME CARE INSTRUCTIONS  Take any supplements that your caregiver prescribes. Follow the directions carefully. Take only the suggested amount.  Have your blood tested 2 months after you start taking supplements.  Eat foods that contain vitamin D. Healthy choices include:  Fortified dairy products, cereals, or juices. Fortified means vitamin D has been added to the food. Check the label on the package to be sure.  Fatty fish like salmon or trout.  Eggs.  Oysters.  Do not use a tanning bed.  Keep your weight at a healthy level. Lose weight if you need to.  Keep all follow-up appointments. Your caregiver will need to perform blood tests to make sure your vitamin D deficiency is going away. SEEK MEDICAL CARE IF:  You have any questions about your treatment.  You continue to have symptoms of vitamin D deficiency.  You have nausea or vomiting.  You are constipated.  You feel confused.  You have  severe abdominal or back pain. MAKE SURE YOU:  Understand these instructions.  Will watch your condition.  Will get help right away if you are not doing well or get worse. Document Released: 02/29/2012 Document Revised: 04/03/2013 Document Reviewed: 02/29/2012 [facility] Patient Information 2014 [place], [place].

## 2013-12-06 ENCOUNTER — Other Ambulatory Visit: Payer: Medicare Other

## 2013-12-06 DIAGNOSIS — N3000 Acute cystitis without hematuria: Secondary | ICD-10-CM

## 2013-12-06 LAB — VITAMIN D 25 HYDROXY (VIT D DEFICIENCY, FRACTURES): Vit D, 25-Hydroxy: 52 ng/mL (ref 30–89)

## 2013-12-06 LAB — HEMOGLOBIN A1C: Mean Plasma Glucose: 120 mg/dL — ABNORMAL HIGH (ref <117)

## 2013-12-06 LAB — INSULIN, FASTING: Insulin fasting, serum: 10 u[IU]/mL (ref 3–28)

## 2013-12-07 ENCOUNTER — Ambulatory Visit: Payer: Self-pay | Admitting: Internal Medicine

## 2013-12-07 LAB — URINALYSIS, ROUTINE W REFLEX MICROSCOPIC
Bilirubin Urine: NEGATIVE
Glucose, UA: NEGATIVE mg/dL
Ketones, ur: NEGATIVE mg/dL
Protein, ur: 30 mg/dL — AB
Specific Gravity, Urine: 1.018 (ref 1.005–1.030)
Urobilinogen, UA: 0.2 mg/dL (ref 0.0–1.0)

## 2013-12-07 LAB — URINE CULTURE: Colony Count: 70000

## 2013-12-07 LAB — URINALYSIS, MICROSCOPIC ONLY
Crystals: NONE SEEN
RBC / HPF: >50 RBC/hpf — AB (ref <3)

## 2013-12-11 ENCOUNTER — Other Ambulatory Visit: Payer: Self-pay | Admitting: Physician Assistant

## 2013-12-11 MED ORDER — METOPROLOL TARTRATE 25 MG PO TABS: 25.0000 mg | ORAL_TABLET | Freq: Two times a day (BID) | ORAL | Status: DC

## 2014-01-15 ENCOUNTER — Encounter: Payer: Self-pay | Admitting: Physician Assistant

## 2014-01-18 ENCOUNTER — Encounter: Payer: Self-pay | Admitting: Physician Assistant

## 2014-01-18 ENCOUNTER — Ambulatory Visit (INDEPENDENT_AMBULATORY_CARE_PROVIDER_SITE_OTHER): Payer: Medicare Other | Admitting: Physician Assistant

## 2014-01-18 VITALS — BP 102/72 | HR 72 | Temp 97.7°F | Resp 16 | Ht 69.0 in | Wt 151.0 lb

## 2014-01-18 DIAGNOSIS — I10 Essential (primary) hypertension: Secondary | ICD-10-CM

## 2014-01-18 DIAGNOSIS — W19XXXA Unspecified fall, initial encounter: Secondary | ICD-10-CM

## 2014-01-18 DIAGNOSIS — N390 Urinary tract infection, site not specified: Secondary | ICD-10-CM

## 2014-01-18 DIAGNOSIS — Y92009 Unspecified place in unspecified non-institutional (private) residence as the place of occurrence of the external cause: Secondary | ICD-10-CM

## 2014-01-18 DIAGNOSIS — R531 Weakness: Secondary | ICD-10-CM

## 2014-01-18 DIAGNOSIS — I4891 Unspecified atrial fibrillation: Secondary | ICD-10-CM

## 2014-01-18 NOTE — Patient Instructions (Signed)
Hypotension Monitor BP and HR at home, you can decrease his metoprolol to 1/2 pill twice a day if his BP is below 110/70 or HR above 90.  Will start PT due to fall risk As your heart beats, it forces blood through your arteries. This force is your blood pressure. If your blood pressure is too low for you to go about your normal activities or to support the organs of your body, you have hypotension. Hypotension is also referred to as low blood pressure. When your blood pressure becomes too low, you may not get enough blood to your brain. As a result, you may feel weak, feel lightheaded, or develop a rapid heart rate. In a more severe case, you may faint. CAUSES Various conditions can cause hypotension. These include:  Blood loss.  Dehydration.  Heart or endocrine problems.  Pregnancy.  Severe infection.  Not having a well-balanced diet filled with needed nutrients.  Severe allergic reactions (anaphylaxis). Some medicines, such as blood pressure medicine or water pills (diuretics), may lower your blood pressure below normal. Sometimes taking too much medicine or taking medicine not as directed can cause hypotension. TREATMENT  Hospitalization is sometimes required for hypotension if fluid or blood replacement is needed, if time is needed for medicines to wear off, or if further monitoring is needed. Treatment might include changing your diet, changing your medicines (including medicines aimed at raising your blood pressure), and use of support stockings. HOME CARE INSTRUCTIONS   Drink enough fluids to keep your urine clear or pale yellow.  Take your medicines as directed by your health care provider.  Get up slowly from reclining or sitting positions. This gives your blood pressure a chance to adjust.  Wear support stockings as directed by your health care provider.  Maintain a healthy diet by including nutritious food, such as fruits, vegetables, nuts, whole grains, and lean  meats. SEEK MEDICAL CARE IF:  You have vomiting or diarrhea.  You have a fever for more than 2 3 days.  You feel more thirsty than usual.  You feel weak and tired. SEEK IMMEDIATE MEDICAL CARE IF:   You have chest pain or a fast or irregular heartbeat.  You have a loss of feeling in some part of your body, or you lose movement in your arms or legs.  You have trouble speaking.  You become sweaty or feel lightheaded.  You faint. MAKE SURE YOU:   Understand these instructions.  Will watch your condition.  Will get help right away if you are not doing well or get worse. Document Released: 12/07/2005 Document Revised: 09/27/2013 Document Reviewed: 06/09/2013 [facility] Patient Information 2014 [place]. Diet for Gastroesophageal Reflux Disease, Adult Reflux (acid reflux) is when acid from your stomach flows up into the esophagus. When acid comes in contact with the esophagus, the acid causes irritation and soreness (inflammation) in the esophagus. When reflux happens often or so severely that it causes damage to the esophagus, it is called gastroesophageal reflux disease (GERD). Nutrition therapy can help ease the discomfort of GERD. FOODS OR DRINKS TO AVOID OR LIMIT  Smoking or chewing tobacco. Nicotine is one of the most potent stimulants to acid production in the gastrointestinal tract.  Caffeinated and decaffeinated coffee and black tea.  Regular or low-calorie carbonated beverages or energy drinks (caffeine-free carbonated beverages are allowed).   Strong spices, such as black pepper, white pepper, red pepper, cayenne, curry powder, and chili powder.  Peppermint or spearmint.  Chocolate.  High-fat foods, including  meats and fried foods. Extra added fats including oils, butter, salad dressings, and nuts. Limit these to less than 8 tsp per day.  Fruits and vegetables if they are not tolerated, such as citrus fruits or tomatoes.  Alcohol.  Any food that seems to  aggravate your condition. If you have questions regarding your diet, call your caregiver or a registered dietitian. OTHER THINGS THAT MAY HELP GERD INCLUDE:   Eating your meals slowly, in a relaxed setting.  Eating 5 to 6 small meals per day instead of 3 large meals.  Eliminating food for a period of time if it causes distress.  Not lying down until 3 hours after eating a meal.  Keeping the head of your bed raised 6 to 9 inches (15 to 23 cm) by using a foam wedge or blocks under the legs of the bed. Lying flat may make symptoms worse.  Being physically active. Weight loss may be helpful in reducing reflux in overweight or obese adults.  Wear loose fitting clothing EXAMPLE MEAL PLAN This meal plan is approximately 2,000 calories based on [email or web address] meal planning guidelines. Breakfast   cup cooked oatmeal.  1 cup strawberries.  1 cup low-fat milk.  1 oz almonds. Snack  1 cup cucumber slices.  6 oz yogurt (made from low-fat or fat-free milk). Lunch  2 slice whole-wheat bread.  2 oz sliced [place].  2 tsp mayonnaise.  1 cup blueberries.  1 cup snap peas. Snack  6 whole-wheat crackers.  1 oz string cheese. Dinner   cup brown rice.  1 cup mixed veggies.  1 tsp olive oil.  3 oz grilled fish. Document Released: 12/07/2005 Document Revised: 02/29/2012 Document Reviewed: 10/23/2011 [facility] Patient Information 2014 [place], [place].

## 2014-01-18 NOTE — Progress Notes (Signed)
Complete Physical HPI Patient presents for complete physical.   Patient's blood pressure has been controlled at home. Patient denies chest pain, shortness of breath, dizziness. BP: 102/72 mmHg  Patient's cholesterol is diet controlled. The patient's cholesterol last visit was  Lab Results  Component Value Date   CHOL 170 12/05/2013   HDL 58 12/05/2013   LDLCALC 75 12/05/2013   TRIG 187* 12/05/2013   CHOLHDL 2.9 12/05/2013   The patient has been working on diet and exercise for prediabetes, denies changes in vision, polys, and paresthesias. Last A1C in office was  Hemoglobin A1C  Date Value Range Status  12/05/2013 5.8* <5.7 % Final   Patient had first fall in 3 months, has fallen 3 times in the past year. This most recent time he was bending over and felt off balance, denies changes in vision, dizzy, vertigo.  Denies LOC, hitting his head, or any pain. He does have both arms wrapped.  He uses a walker, he has suprapubic catheter. He is a high fall risk.  He will follow up with Dr. Jasmine December in 2 weeks who follows his PSA.  Wife was in nursing home and recently passed on 2022/12/31 which he has been depressed but states he is doing well. His daughter is with him and states he does sleep a lot.   Current Medications:  Current Outpatient Prescriptions on File Prior to Visit  Medication Sig Dispense Refill  . acetaminophen (TYLENOL 8 HOUR) 650 MG CR tablet Take 650 mg by mouth every 8 (eight) hours as needed for pain.      Marland Kitchen b complex vitamins tablet Take 1 tablet by mouth daily.      . Cholecalciferol (VITAMIN D3) 2000 UNITS capsule Take 4,000 Units by mouth daily.       Marland Kitchen docusate sodium (COLACE) 100 MG capsule Take 200 mg by mouth 2 (two) times daily.      . Ensure Plus (ENSURE PLUS) LIQD Take 237 mLs by mouth 2 (two) times daily between meals.      Marland Kitchen HYDROcodone-acetaminophen (NORCO) 10-325 MG per tablet Take 1 tablet by mouth every 4 (four) hours as needed for pain.      Marland Kitchen LORazepam  (ATIVAN) 1 MG tablet Take 1 mg by mouth 3 (three) times daily as needed. As needed for anxiety  Or sleep      . metoprolol tartrate (LOPRESSOR) 25 MG tablet Take 1 tablet (25 mg total) by mouth 2 (two) times daily.  60 tablet  11  . mirtazapine (REMERON) 30 MG tablet Take 15 mg by mouth at bedtime.       Marland Kitchen oxybutynin (DITROPAN) 5 MG tablet       . predniSONE (DELTASONE) 10 MG tablet 10 mg daily.       . Probiotic Product (PROBIOTIC DAILY PO) Take 1 capsule by mouth 1 day or 1 dose.      . sertraline (ZOLOFT) 50 MG tablet Takes 1 daily       No current facility-administered medications on file prior to visit.   Health Maintenance:  Tetanus: 2014 Pneumovax: 2012 Flu vaccine: 2014 Zostavax: 2013 DEXA: declines Colonoscopy: 2004 EGD: 2012  Allergies: No Known Allergies Medical History:  Past Medical History  Diagnosis Date  . Pancreatitis   . Fractured hip 09/2011  . Prostate tumor     to have bx by Dr. Jasmine December  . UTI (lower urinary tract infection) 06/13/2012    has had x 3 months  . Falls 06/13/12  hit head approx. 1 month ago  . Unsteady gait 06/13/2012    "bad for awhile but getting worse recently"  . Slurred speech     slurred speech on 06/12/12 - didn't last long, slower talking recently  . Cancer     bladder  . Lung abnormality     spot on ct approx 3 months ago - to be followed - [facility]  . 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 Nose fracture     after a fall - Oct. 2012  . Hypertension   . Dysrhythmia     history of a-fib  - followed by Dr. Lovena Le   . Osteopenia   . Fractures, stress     spine  . 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 Hallucinations     will hospitalized -  . Foley catheter in place   . Anxiety   . Ulcer of ankle     rt  . Atrial fibrillation    Surgical History:  Past Surgical History  Procedure Laterality Date  . Cholecystectomy    . Partial hip arthroplasty    . Cystoscopy      has  yearly due to bladder cancer  . Retinal detachment surgery      at least 3 and have been in both eyes  . Transurethral resection of bladder tumor    . Transurethral resection of prostate  01/18/2013    Procedure: TRANSURETHRAL RESECTION OF THE PROSTATE WITH GYRUS INSTRUMENTS;  Surgeon: Molli Hazard, MD;  Location: [facility];  Service: Urology;  Laterality: N/A;  CYSTOSCOPY, SUPRAPUBIC TUBE PLACEMENT, CHANNEL TURP, RECTAL EXAM    . Insertion of suprapubic catheter  01/18/2013    Procedure: INSERTION OF SUPRAPUBIC CATHETER;  Surgeon: Molli Hazard, MD;  Location: [facility];  Service: Urology;  Laterality: N/A;  . Cystoscopy  01/18/2013    Procedure: CYSTOSCOPY;  Surgeon: Molli Hazard, MD;  Location: [facility];  Service: Urology;  Laterality: N/A;   Family History:  Family History  Problem Relation Age of Onset  . Heart disease      No family history  . Pancreatitis Mother   . Alcohol abuse Father    Social History:  History   Social History  . Marital Status: Married    Spouse Name: N/A    Number of Children: N/A  . Years of Education: N/A   Occupational History  . Not on file.   Social History Main Topics  . Smoking status: Current Every Day Smoker -- 0.25 packs/day for 62 years    Types: Cigarettes  . Smokeless tobacco: Not on file  . Alcohol Use: No  . Drug Use: No  . Sexual Activity: No   Other Topics Concern  . Not on file   Social History Narrative  . No narrative on file   ROS Constitutional: Denies weight loss/gain, headaches, insomnia, fatigue, night sweats, and change in appetite. Eyes: Denies redness, blurred vision, diplopia, discharge, itchy, watery eyes.  ENT: Denies discharge, congestion, post nasal drip, sore throat, earache, hearing loss, dental pain, Tinnitus, Vertigo, Sinus pain, snoring.  Cardio: (Dr. Lovena Le) Echo 2012 Normal EF 55-60% mild AR Denies chest pain, palpitations, irregular heartbeat, dyspnea, diaphoresis, orthopnea, PND,  claudication, edema Respiratory: denies cough, dyspnea, pleurisy, hoarseness, wheezing.  Gastrointestinal: Denies dysphagia, heartburn, pain, cramps, nausea, vomiting, bloating, diarrhea, constipation, hematemesis, melena, hematochezia, hemorrhoids Genitourinary: (Dr. Jasmine December) Denies dysuria, frequency, urgency, nocturia, hesitancy, discharge,  hematuria, flank pain Musculoskeletal: Denies arthralgia, myalgia, stiffness, Jt. Swelling, pain, Skin: Denies pruritis, rash, hives,  acne, eczema, changing in skin lesion Neuro: Denies Weakness, tremor, incoordination, spasms, paresthesia, pain Psychiatric: Denies confusion, memory loss, sensory loss Endocrine: Denies change in weight, skin, hair change, nocturia, and paresthesia, Diabetic Denies Polys, visual blurring, hyper /hypo glycemic episodes.  Heme/Lymph: Denies Excessive bleeding, bruising, enlarged lymph nodes  Physical Exam: Estimated body mass index is 22.29 kg/(m^2) as calculated from the following:   Height as of this encounter: 5\' 9"  (1.753 m).   Weight as of this encounter: 151 lb (68.493 kg). Filed Vitals:   01/18/14 1511  BP: 102/72  Pulse: 72  Temp: 97.7 F (36.5 C)  Resp: 16   General Appearance: Well nourished, in no apparent distress. Eyes: PERRLA, EOMs, conjunctiva no swelling or erythema, normal fundi and vessels. Sinuses: No Frontal/maxillary tenderness ENT/Mouth: Ext aud canals clear, normal light reflex with TMs without erythema, bulging. Good dentition. No erythema, swelling, or exudate on post pharynx. Tonsils not swollen or erythematous. Hearing fair Neck: Supple, thyroid normal. No bruits Respiratory: Respiratory effort normal, BS equal bilaterally without rales, rhonchi, wheezing or stridor. Cardio: Irreg, reg without murmurs, rubs or gallops. Cold with brisk peripheral pulses without edema.  Chest: symmetric, with normal excursions and percussion. Abdomen: Soft, +BS. Non tender, no guarding, rebound, hernias,  masses, or organomegaly. .  Lymphatics: Non tender without lymphadenopathy.  Genitourinary: defer Musculoskeletal: Full ROM all peripheral extremities,4/5 strength, and wide stance gait with walker Skin: Warm, dry without rashes, lesions, ecchymosis. Left foot dry with fungal infection, no ulcers.  Neuro: Cranial nerves intact, reflexes equal bilaterally. Mild muscle atrophy bilateral legs, no cerebellar symptoms. Sensation intact.  Psych: Awake and oriented X 3, normal affect, Insight and Judgment appropriate.   EKG:  RRR, WNL no changes.  Assessment and Plan: Diagnosis  Pancreatitis- no recent episodes- monitor  History of Fractured hip s/p fall - recent fall- increased risk- suggest PT- very difficult for PT to leave house- do to walker, weakness- will suggest home health  Prostate tumor- Follow up Dr. Jasmine December  UTI (lower urinary tract infection)- recheck UA  Cancer, bladder with catheter in place- follow up Dr. Jasmine December  Lung abnormality- declines repeat CT scan lung at this time  Hypertension- some hypotension? With recent fall- suggest cutting in half and checking HR/BP  Osteopenia- need DEXA next year  Anxiety- controlled  Hx of ulcer right ankle- no ulcers today, continue feet checks.   Atrial fibrillation- EKG shows NSR and refer back to Dr. Lovena Le  Failure to thrive- has improved with prednisone and daughter and patient wishes to continue.   Discussed med's effects and SE's. Screening labs and tests as requested with regular follow-up as recommended.  Vicie Mutters 3:23 PM

## 2014-01-19 LAB — URINALYSIS, ROUTINE W REFLEX MICROSCOPIC
BILIRUBIN URINE: NEGATIVE
Glucose, UA: NEGATIVE mg/dL
Ketones, ur: NEGATIVE mg/dL
NITRITE: NEGATIVE
PROTEIN: NEGATIVE mg/dL
Specific Gravity, Urine: 1.013 (ref 1.005–1.030)
UROBILINOGEN UA: 0.2 mg/dL (ref 0.0–1.0)
pH: 6 (ref 5.0–8.0)

## 2014-01-19 LAB — MICROALBUMIN / CREATININE URINE RATIO
Creatinine, Urine: 60.5 mg/dL
MICROALB UR: 6.15 mg/dL — AB (ref 0.00–1.89)
Microalb Creat Ratio: 101.7 mg/g — ABNORMAL HIGH (ref 0.0–30.0)

## 2014-01-19 LAB — URINE CULTURE: Colony Count: >=100000

## 2014-01-19 LAB — URINALYSIS, MICROSCOPIC ONLY
BACTERIA UA: NONE SEEN
CRYSTALS: NONE SEEN
Casts: NONE SEEN
SQUAMOUS EPITHELIAL / LPF: NONE SEEN

## 2014-03-15 ENCOUNTER — Ambulatory Visit: Payer: Self-pay | Admitting: Physician Assistant

## 2014-03-16 ENCOUNTER — Encounter: Payer: Self-pay | Admitting: Physician Assistant

## 2014-03-16 ENCOUNTER — Ambulatory Visit (INDEPENDENT_AMBULATORY_CARE_PROVIDER_SITE_OTHER): Payer: Medicare Other | Admitting: Physician Assistant

## 2014-03-16 VITALS — BP 128/62 | HR 72 | Temp 97.9°F | Resp 16 | Wt 153.0 lb

## 2014-03-16 DIAGNOSIS — Z0000 Encounter for general adult medical examination without abnormal findings: Secondary | ICD-10-CM

## 2014-03-16 DIAGNOSIS — Z1331 Encounter for screening for depression: Secondary | ICD-10-CM

## 2014-03-16 DIAGNOSIS — I10 Essential (primary) hypertension: Secondary | ICD-10-CM

## 2014-03-16 NOTE — Patient Instructions (Signed)
Preventative Care for Adults, Male       REGULAR HEALTH EXAMS:  A routine yearly physical is a good way to check in with your primary care provider about your health and preventive screening. It is also an opportunity to share updates about your health and any concerns you have, and receive a thorough all-over exam.   Most health insurance companies pay for at least some preventative services.  Check with your health plan for specific coverages.  WHAT PREVENTATIVE SERVICES DO MEN NEED?  Adult men should have their weight and blood pressure checked regularly.   Men age 35 and older should have their cholesterol levels checked regularly.  Beginning at age 50 and continuing to age 75, men should be screened for colorectal cancer.  Certain people should may need continued testing until age 85.  Other cancer screening may include exams for testicular and prostate cancer.  Updating vaccinations is part of preventative care.  Vaccinations help protect against diseases such as the flu.  Lab tests are generally done as part of preventative care to screen for anemia and blood disorders, to screen for problems with the kidneys and liver, to screen for bladder problems, to check blood sugar, and to check your cholesterol level.  Preventative services generally include counseling about diet, exercise, avoiding tobacco, drugs, excessive alcohol consumption, and sexually transmitted infections.    GENERAL RECOMMENDATIONS FOR GOOD HEALTH:  Healthy diet:  Eat a variety of foods, including fruit, vegetables, animal or vegetable protein, such as meat, fish, chicken, and eggs, or beans, lentils, tofu, and grains, such as rice.  Drink plenty of water daily.  Decrease saturated fat in the diet, avoid lots of red meat, processed foods, sweets, fast foods, and fried foods.  Exercise:  Aerobic exercise helps maintain good heart health. At least 30-40 minutes of moderate-intensity exercise is recommended.  For example, a brisk walk that increases your heart rate and breathing. This should be done on most days of the week.   Find a type of exercise or a variety of exercises that you enjoy so that it becomes a part of your daily life.  Examples are running, walking, swimming, water aerobics, and biking.  For motivation and support, explore group exercise such as aerobic class, spin class, Zumba, Yoga,or  martial arts, etc.    Set exercise goals for yourself, such as a certain weight goal, walk or run in a race such as a 5k walk/run.  Speak to your primary care provider about exercise goals.  Disease prevention:  If you smoke or chew tobacco, find out from your caregiver how to quit. It can literally save your life, no matter how long you have been a tobacco user. If you do not use tobacco, never begin.   Maintain a healthy diet and normal weight. Increased weight leads to problems with blood pressure and diabetes.   The Body Mass Index or BMI is a way of measuring how much of your body is fat. Having a BMI above 27 increases the risk of heart disease, diabetes, hypertension, stroke and other problems related to obesity. Your caregiver can help determine your BMI and based on it develop an exercise and dietary program to help you achieve or maintain this important measurement at a healthful level.  High blood pressure causes heart and blood vessel problems.  Persistent high blood pressure should be treated with medicine if weight loss and exercise do not work.   Fat and cholesterol leaves deposits in your arteries   that can block them. This causes heart disease and vessel disease elsewhere in your body.  If your cholesterol is found to be high, or if you have heart disease or certain other medical conditions, then you may need to have your cholesterol monitored frequently and be treated with medication.   Ask if you should have a stress test if your history suggests this. A stress test is a test done on  a treadmill that looks for heart disease. This test can find disease prior to there being a problem.  Avoid drinking alcohol in excess (more than two drinks per day).  Avoid use of street drugs. Do not share needles with anyone. Ask for professional help if you need assistance or instructions on stopping the use of alcohol, cigarettes, and/or drugs.  Brush your teeth twice a day with fluoride toothpaste, and floss once a day. Good oral hygiene prevents tooth decay and gum disease. The problems can be painful, unattractive, and can cause other health problems. Visit your dentist for a routine oral and dental check up and preventive care every 6-12 months.   Look at your skin regularly.  Use a mirror to look at your back. Notify your caregivers of changes in moles, especially if there are changes in shapes, colors, a size larger than a pencil eraser, an irregular border, or development of new moles.  Safety:  Use seatbelts 100% of the time, whether driving or as a passenger.  Use safety devices such as hearing protection if you work in environments with loud noise or significant background noise.  Use safety glasses when doing any work that could send debris in to the eyes.  Use a helmet if you ride a bike or motorcycle.  Use appropriate safety gear for contact sports.  Talk to your caregiver about gun safety.  Use sunscreen with a SPF (or skin protection factor) of 15 or greater.  Lighter skinned people are at a greater risk of skin cancer. Don't forget to also wear sunglasses in order to protect your eyes from too much damaging sunlight. Damaging sunlight can accelerate cataract formation.   Practice safe sex. Use condoms. Condoms are used for birth control and to help reduce the spread of sexually transmitted infections (or STIs).  Some of the STIs are gonorrhea (the clap), chlamydia, syphilis, trichomonas, herpes, HPV (human papilloma virus) and HIV (human immunodeficiency virus) which causes AIDS.  The herpes, HIV and HPV are viral illnesses that have no cure. These can result in disability, cancer and death.   Keep carbon monoxide and smoke detectors in your home functioning at all times. Change the batteries every 6 months or use a model that plugs into the wall.   Vaccinations:  Stay up to date with your tetanus shots and other required immunizations. You should have a booster for tetanus every 10 years. Be sure to get your flu shot every year, since 5%-20% of the [place] population comes down with the flu. The flu vaccine changes each year, so being vaccinated once is not enough. Get your shot in the fall, before the flu season peaks.   Other vaccines to consider:  Pneumococcal vaccine to protect against certain types of pneumonia.  This is normally recommended for adults age 65 or older.  However, adults younger than 78 years old with certain underlying conditions such as diabetes, heart or lung disease should also receive the vaccine.  Shingles vaccine to protect against Varicella Zoster if you are older than age 60, or younger   than 78 years old with certain underlying illness.  Hepatitis A vaccine to protect against a form of infection of the liver by a virus acquired from food.  Hepatitis B vaccine to protect against a form of infection of the liver by a virus acquired from blood or body fluids, particularly if you work in health care.  If you plan to travel internationally, check with your local health department for specific vaccination recommendations.  Cancer Screening:  Most routine colon cancer screening begins at the age of 50. On a yearly basis, doctors may provide special easy to use take-home tests to check for hidden blood in the stool. Sigmoidoscopy or colonoscopy can detect the earliest forms of colon cancer and is life saving. These tests use a small camera at the end of a tube to directly examine the colon. Speak to your caregiver about this at age 50, when routine  screening begins (and is repeated every 5 years unless early forms of pre-cancerous polyps or small growths are found).   At the age of 50 men usually start screening for prostate cancer every year. Screening may begin at a younger age for those with higher risk. Those at higher risk include African-Americans or having a family history of prostate cancer. There are two types of tests for prostate cancer:   Prostate-specific antigen (PSA) testing. Recent studies raise questions about prostate cancer using PSA and you should discuss this with your caregiver.   Digital rectal exam (in which your doctor's lubricated and gloved finger feels for enlargement of the prostate through the anus).   Screening for testicular cancer.  Do a monthly exam of your testicles. Gently roll each testicle between your thumb and fingers, feeling for any abnormal lumps. The best time to do this is after a hot shower or bath when the tissues are looser. Notify your caregivers of any lumps, tenderness or changes in size or shape immediately.     

## 2014-03-16 NOTE — Progress Notes (Signed)
Subjective:  Brent Day. is a 80 y.o. male who presents for Medicare Annual Wellness Visit and 3 month follow up for HTN, hyperlipidemia, prediabetes, and vitamin D Def.  Date of last medicare wellness visit was is unknown.  His blood pressure has been controlled at home, today their BP is BP: 128/62 mmHg He does workout, walks up and down the drive way and in the house. He denies chest pain, shortness of breath, dizziness.  He is not on cholesterol medication and denies myalgias. His cholesterol is at goal. The cholesterol last visit was:   Lab Results  Component Value Date   CHOL 170 12/05/2013   HDL 58 12/05/2013   LDLCALC 75 12/05/2013   TRIG 187* 12/05/2013   CHOLHDL 2.9 12/05/2013   He has been working on diet and exercise for prediabetes, and denies foot ulcerations, paresthesia of the feet, polydipsia and polyuria. Last A1C in the office was:  Lab Results  Component Value Date   HGBA1C 5.8* 12/05/2013   Patient is on Vitamin D supplement.    He has a suprapubic catheter in place, he lost his wife in Jan and admits to being sad about that but he has a very supportive family, his daughter is here and he is now living with his son. His daughter is going on vacation and wants Home health to come out. She irrigated his foley and helps with day to day activities. Please see below. His daughter states he has been sleeping more and he is currently dosing off occ. Takes 1-2 ativan a day, 1 in the afternoon and 1 in the evening, he will take 2 at a time if he is crying from his wifes passing. He went down to Remeron to 15mg  at night due to getting up in the night and cooking which his family believes it is not safe.    Names of Other Physician/Practitioners you currently use: 1. [facility] here for primary care 2. Dr. Zadie Rhine, eye doctor, last visit was yesterday 47. uknown, dentist, last visit unknown Patient Care Team: Unk Pinto, MD  as PCP - General (Internal Medicine)  Medication Review: Current Outpatient Prescriptions on File Prior to Visit  Medication Sig Dispense Refill  . acetaminophen (TYLENOL 8 HOUR) 650 MG CR tablet Take 650 mg by mouth every 8 (eight) hours as needed for pain.      Marland Kitchen b complex vitamins tablet Take 1 tablet by mouth daily.      . Cholecalciferol (VITAMIN D3) 2000 UNITS capsule Take 4,000 Units by mouth daily.       Marland Kitchen docusate sodium (COLACE) 100 MG capsule Take 200 mg by mouth 2 (two) times daily.      . Ensure Plus (ENSURE PLUS) LIQD Take 237 mLs by mouth 2 (two) times daily between meals.      Marland Kitchen HYDROcodone-acetaminophen (NORCO) 10-325 MG per tablet Take 1 tablet by mouth every 4 (four) hours as needed for pain.      Marland Kitchen LORazepam (ATIVAN) 1 MG tablet Take 1 mg by mouth 3 (three) times daily as needed. As needed for anxiety  Or sleep      . metoprolol tartrate (LOPRESSOR) 25 MG tablet Take 1 tablet (25 mg total) by mouth 2 (two) times daily.  60 tablet  11  . mirtazapine (REMERON) 30 MG tablet Take 15 mg by mouth at bedtime.       Marland Kitchen oxybutynin (DITROPAN) 5 MG tablet       .  predniSONE (DELTASONE) 10 MG tablet 10 mg daily.       . Probiotic Product (PROBIOTIC DAILY PO) Take 1 capsule by mouth 1 day or 1 dose.      . sertraline (ZOLOFT) 50 MG tablet Takes 1 daily       No current facility-administered medications on file prior to visit.    Current Problems (verified) Patient Active Problem List   Diagnosis Date Noted  . Pulmonary nodule 11/24/2012  . UTI (lower urinary tract infection) 07/27/2012  . Cancer   . Generalized weakness 06/13/2012  . FTT (failure to thrive) in adult 06/13/2012  . Nausea 06/13/2012  . Essential hypertension, benign 10/18/2009  . Atrial fibrillation 10/18/2009  . SINUS BRADYCARDIA 10/18/2009    Screening Tests Health Maintenance  Topic Date Due  . Colonoscopy  12/22/2012  . Influenza Vaccine  07/21/2014  . Tetanus/tdap  08/16/2023  . Pneumococcal  Polysaccharide Vaccine Age 78 And Over  Completed  . Zostavax  Completed   Prior vaccinations:  Immunization History  Administered Date(s) Administered  . Influenza, High Dose Seasonal PF 10/05/2013  . Influenza-Unspecified 10/21/2012  . Pneumococcal-Unspecified 10/05/2011  . Td 08/15/2013  . Zoster 01/19/2012   Preventative care: Last colonoscopy: 2004  History reviewed: allergies, current medications, past family history, past medical history, past social history, past surgical history and problem list   Risk Factors: Tobacco History  Substance Use Topics  . Smoking status: Current Every Day Smoker -- 0.25 packs/day for 62 years    Types: Cigarettes  . Smokeless tobacco: Not on file  . Alcohol Use: No   He does smoke.  Are there smokers in your home (other than you)?  No  Alcohol Current alcohol use: none  Caffeine Current caffeine use: coffee 1-2 /day  Exercise Current exercise habits: The patient does not participate in regular exercise at present.  Current exercise: walking  Nutrition/Diet Current diet: in general, a "healthy" diet    Cardiac risk factors: advanced age (older than 70 for men, 64 for women), dyslipidemia, hypertension, male gender, sedentary lifestyle and smoking/ tobacco exposure.  Depression Screen Nurse depression screen reviewed. + due to wife passing in Jan.   (Note: if answer to either of the following is "Yes", a more complete depression screening is indicated)   Q1: Over the past two weeks, have you felt down, depressed or hopeless? Yes  Q2: Over the past two weeks, have you felt little interest or pleasure in doing things? Yes  Have you lost interest or pleasure in daily life? No  Do you often feel hopeless? No  Do you cry easily over simple problems? No  Activities of Daily Living Nurse ADLs screen reviewed. Staying with his son as of today.   In your present state of health, do you have any difficulty performing the following  activities?:  Driving? Yes Managing money?  Yes Feeding yourself? No Getting from bed to chair? No Climbing a flight of stairs? Yes Preparing food and eating?: No Bathing or showering? No Getting dressed: No Getting to the toilet? No Using the toilet:No Moving around from place to place: Yes In the past year have you fallen or had a near fall?:Yes but no injury due to it, walks with walker  Has home health, no rugs, has shower with handles, seems very safe house.    Are you sexually active?  No  Do you have more than one partner?  No  Vision Difficulties: Yes  Hearing Difficulties: Yes- cleared out right  ear and it is better Do you often ask people to speak up or repeat themselves? Yes Do you experience ringing or noises in your ears? No Do you have difficulty understanding soft or whispered voices? Yes  Cognition  Do you feel that you have a problem with memory?No  Do you often misplace items? No  Do you feel safe at home?  Yes  Advanced directives Does patient have a [place]? Yes Does patient have a Living Will? Yes   Objective:     Vision and hearing screens reviewed.   Blood pressure 128/62, pulse 72, temperature 97.9 F (36.6 C), resp. rate 16, weight 153 lb (69.4 kg). Body mass index is 22.58 kg/(m^2).  General appearance: alert, no distress, WD/WN, male Cognitive Testing  Alert? Yes  Normal Appearance?Yes  Oriented to person? Yes  Place? Yes   Time? Yes  Recall of three objects?  Yes  Can perform simple calculations? Yes  Displays appropriate judgment?Yes  Can read the correct time from a watch face?Yes  HEENT: normocephalic, sclerae anicteric, TMs pearly, nares patent, no discharge or erythema, pharynx normal Oral cavity: MMM, no lesions Neck: supple, no lymphadenopathy, no thyromegaly, no masses Heart: RRR, normal S1, S2 Lungs: CTA bilaterally, no wheezes, rhonchi, or rales Abdomen: +bs, soft, non tender, non distended, with  suprapubic catheter Musculoskeletal: nontender, no swelling, no obvious deformity, slowly walks with heavy steps with a walker Extremities: no edema, no cyanosis, no clubbing Pulses: 2+ symmetric, upper and lower extremities, normal cap refill Neurological: alert, oriented x 3, CN2-12 intact, strength normal upper extremities and lower extremities, throughout, no cerebellar signs Psychiatric: normal affect, some lethargy, falling asleep in the chair But very pleasant pleasant   Assessment:   Face to face encounter- patient has a difficult time leaving the home due to weakness, afib, and catheter- he would benefit from home health for catheter irrigation and replacement.  Lethargy- check CO2, BMP, decrease ativan use   Plan:   During the course of the visit the patient was educated and counseled about appropriate screening and preventive services including:    Influenza vaccine  Screening electrocardiogram  Diabetes screening  Glaucoma screening  Nutrition counseling   Smoking cessation counseling  Screening recommendations, referrals: Vaccinations: Tdap vaccine not done  Influenza vaccine not done Pneumococcal vaccine not done Shingles vaccine not done Hep B vaccine not done  Nutrition assessed and recommended  Colonoscopy no Recommended yearly ophthalmology/optometry visit for glaucoma screening and checkup Recommended yearly dental visit for hygiene and checkup Advanced directives - yes  Conditions/risks identified: BMI: Discussed weight loss, diet, and increase physical activity.  Increase physical activity: AHA recommends 150 minutes of physical activity a week.  Medications reviewed Diabetes is at goal, ACE/ARB therapy: No, Reason not on Ace Inhibitor/ARB therapy:  only prediabetes Urinary Incontinence is not an issue: discussed non pharmacology and pharmacology options.  Fall risk: high- discussed PT, home fall assessment, medications.    Medicare  Attestation I have personally reviewed: The patient's medical and social history Their use of alcohol, tobacco or illicit drugs Their current medications and supplements The patient's functional ability including ADLs,fall risks, home safety risks, cognitive, and hearing and visual impairment Diet and physical activities Evidence for depression or mood disorders  The patient's weight, height, BMI, and visual acuity have been recorded in the chart.  I have made referrals, counseling, and provided education to the patient based on review of the above and I have provided the patient with  a written personalized care plan for preventive services.     Vicie Mutters, PA-C   03/16/2014

## 2014-03-17 LAB — BASIC METABOLIC PANEL WITH GFR
BUN: 28 mg/dL — AB (ref 6–23)
CO2: 24 meq/L (ref 19–32)
CREATININE: 1.49 mg/dL — AB (ref 0.50–1.35)
Calcium: 9.1 mg/dL (ref 8.4–10.5)
Chloride: 107 mEq/L (ref 96–112)
GFR, Est African American: 49 mL/min — ABNORMAL LOW
GFR, Est Non African American: 42 mL/min — ABNORMAL LOW
GLUCOSE: 149 mg/dL — AB (ref 70–99)
Potassium: 4.3 mEq/L (ref 3.5–5.3)
Sodium: 142 mEq/L (ref 135–145)

## 2014-03-28 ENCOUNTER — Other Ambulatory Visit: Payer: Self-pay | Admitting: Emergency Medicine

## 2014-03-28 MED ORDER — LORAZEPAM 1 MG PO TABS
1.0000 mg | ORAL_TABLET | Freq: Three times a day (TID) | ORAL | Status: DC | PRN
Start: 2014-03-28 — End: 2014-05-10

## 2014-04-10 ENCOUNTER — Ambulatory Visit: Payer: Self-pay | Admitting: Internal Medicine

## 2014-04-19 ENCOUNTER — Encounter: Payer: Self-pay | Admitting: Internal Medicine

## 2014-04-19 ENCOUNTER — Ambulatory Visit (INDEPENDENT_AMBULATORY_CARE_PROVIDER_SITE_OTHER): Payer: Medicare Other | Admitting: Internal Medicine

## 2014-04-19 VITALS — BP 132/68 | HR 72 | Temp 98.1°F | Resp 18 | Ht 69.0 in | Wt 160.2 lb

## 2014-04-19 DIAGNOSIS — E559 Vitamin D deficiency, unspecified: Secondary | ICD-10-CM

## 2014-04-19 DIAGNOSIS — R7309 Other abnormal glucose: Secondary | ICD-10-CM

## 2014-04-19 DIAGNOSIS — I10 Essential (primary) hypertension: Secondary | ICD-10-CM

## 2014-04-19 DIAGNOSIS — Z79899 Other long term (current) drug therapy: Secondary | ICD-10-CM

## 2014-04-19 DIAGNOSIS — E782 Mixed hyperlipidemia: Secondary | ICD-10-CM

## 2014-04-19 DIAGNOSIS — R7303 Prediabetes: Secondary | ICD-10-CM | POA: Insufficient documentation

## 2014-04-19 LAB — CBC WITH DIFFERENTIAL/PLATELET
Basophils Absolute: 0 10*3/uL (ref 0.0–0.1)
Basophils Relative: 0 % (ref 0–1)
EOS PCT: 1 % (ref 0–5)
Eosinophils Absolute: 0.1 10*3/uL (ref 0.0–0.7)
HEMATOCRIT: 39.9 % (ref 39.0–52.0)
Hemoglobin: 13.6 g/dL (ref 13.0–17.0)
LYMPHS PCT: 10 % — AB (ref 12–46)
Lymphs Abs: 1.4 10*3/uL (ref 0.7–4.0)
MCH: 31.5 pg (ref 26.0–34.0)
MCHC: 34.1 g/dL (ref 30.0–36.0)
MCV: 92.4 fL (ref 78.0–100.0)
MONO ABS: 1.2 10*3/uL — AB (ref 0.1–1.0)
Monocytes Relative: 9 % (ref 3–12)
Neutro Abs: 10.8 10*3/uL — ABNORMAL HIGH (ref 1.7–7.7)
Neutrophils Relative %: 80 % — ABNORMAL HIGH (ref 43–77)
Platelets: 239 10*3/uL (ref 150–400)
RBC: 4.32 MIL/uL (ref 4.22–5.81)
RDW: 15.3 % (ref 11.5–15.5)
WBC: 13.5 10*3/uL — AB (ref 4.0–10.5)

## 2014-04-19 LAB — HEMOGLOBIN A1C
Hgb A1c MFr Bld: 6.2 % — ABNORMAL HIGH (ref <5.7)
MEAN PLASMA GLUCOSE: 131 mg/dL — AB (ref <117)

## 2014-04-19 NOTE — Patient Instructions (Addendum)
Try decrease Lorazepam to only 1/2 pill in the morning  +++++++++++++++++++++++++++++++++++++++++++++++++++++++++++++++  Decrease Prednisone 10 mg to 1 pill every other day for  1 month (take even days)  Then  Decrease to 1/2 pill every day til next OV  (take even days)    Hypertension As your heart beats, it forces blood through your arteries. This force is your blood pressure. If the pressure is too high, it is called hypertension (HTN) or high blood pressure. HTN is dangerous because you may have it and not know it. High blood pressure may mean that your heart has to work harder to pump blood. Your arteries may be narrow or stiff. The extra work puts you at risk for heart disease, stroke, and other problems.  Blood pressure consists of two numbers, a higher number over a lower, 110/72, for example. It is stated as "110 over 72." The ideal is below 120 for the top number (systolic) and under 80 for the bottom (diastolic). Write down your blood pressure today. You should pay close attention to your blood pressure if you have certain conditions such as:  Heart failure.  Prior heart attack.  Diabetes  Chronic kidney disease.  Prior stroke.  Multiple risk factors for heart disease. To see if you have HTN, your blood pressure should be measured while you are seated with your arm held at the level of the heart. It should be measured at least twice. A one-time elevated blood pressure reading (especially in the Emergency Department) does not mean that you need treatment. There may be conditions in which the blood pressure is different between your right and left arms. It is important to see your caregiver soon for a recheck. Most people have essential hypertension which means that there is not a specific cause. This type of high blood pressure may be lowered by changing lifestyle factors such as:  Stress.  Smoking.  Lack of exercise.  Excessive weight.  Drug/tobacco/alcohol  use.  Eating less salt. Most people do not have symptoms from high blood pressure until it has caused damage to the body. Effective treatment can often prevent, delay or reduce that damage. TREATMENT  When a cause has been identified, treatment for high blood pressure is directed at the cause. There are a large number of medications to treat HTN. These fall into several categories, and your caregiver will help you select the medicines that are best for you. Medications may have side effects. You should review side effects with your caregiver. If your blood pressure stays high after you have made lifestyle changes or started on medicines,   Your medication(s) may need to be changed.  Other problems may need to be addressed.  Be certain you understand your prescriptions, and know how and when to take your medicine.  Be sure to follow up with your caregiver within the time frame advised (usually within two weeks) to have your blood pressure rechecked and to review your medications.  If you are taking more than one medicine to lower your blood pressure, make sure you know how and at what times they should be taken. Taking two medicines at the same time can result in blood pressure that is too low. SEEK IMMEDIATE MEDICAL CARE IF:  You develop a severe headache, blurred or changing vision, or confusion.  You have unusual weakness or numbness, or a faint feeling.  You have severe chest or abdominal pain, vomiting, or breathing problems. MAKE SURE YOU:   Understand these instructions.  Will  watch your condition.  Will get help right away if you are not doing well or get worse.   Diabetes and Exercise Exercising regularly is important. It is not just about losing weight. It has many health benefits, such as:  Improving your overall fitness, flexibility, and endurance.  Increasing your bone density.  Helping with weight control.  Decreasing your body fat.  Increasing your muscle  strength.  Reducing stress and tension.  Improving your overall health. People with diabetes who exercise gain additional benefits because exercise:  Reduces appetite.  Improves the body's use of blood sugar (glucose).  Helps lower or control blood glucose.  Decreases blood pressure.  Helps control blood lipids (such as cholesterol and triglycerides).  Improves the body's use of the hormone insulin by:  Increasing the body's insulin sensitivity.  Reducing the body's insulin needs.  Decreases the risk for heart disease because exercising:  Lowers cholesterol and triglycerides levels.  Increases the levels of good cholesterol (such as high-density lipoproteins [HDL]) in the body.  Lowers blood glucose levels. YOUR ACTIVITY PLAN  Choose an activity that you enjoy and set realistic goals. Your health care provider or diabetes educator can help you make an activity plan that works for you. You can break activities into 2 or 3 sessions throughout the day. Doing so is as good as one long session. Exercise ideas include:  Taking the dog for a walk.  Taking the stairs instead of the elevator.  Dancing to your favorite song.  Doing your favorite exercise with a friend. RECOMMENDATIONS FOR EXERCISING WITH TYPE 1 OR TYPE 2 DIABETES   Check your blood glucose before exercising. If blood glucose levels are greater than 240 mg/dL, check for urine ketones. Do not exercise if ketones are present.  Avoid injecting insulin into areas of the body that are going to be exercised. For example, avoid injecting insulin into:  The arms when playing tennis.  The legs when jogging.  Keep a record of:  Food intake before and after you exercise.  Expected peak times of insulin action.  Blood glucose levels before and after you exercise.  The type and amount of exercise you have done.  Review your records with your health care provider. Your health care provider will help you to develop  guidelines for adjusting food intake and insulin amounts before and after exercising.  If you take insulin or oral hypoglycemic agents, watch for signs and symptoms of hypoglycemia. They include:  Dizziness.  Shaking.  Sweating.  Chills.  Confusion.  Drink plenty of water while you exercise to prevent dehydration or heat stroke. Body water is lost during exercise and must be replaced.  Talk to your health care provider before starting an exercise program to make sure it is safe for you. Remember, almost any type of activity is better than none.    Cholesterol Cholesterol is a white, waxy, fat-like protein needed by your body in small amounts. The liver makes all the cholesterol you need. It is carried from the liver by the blood through the blood vessels. Deposits (plaque) may build up on blood vessel walls. This makes the arteries narrower and stiffer. Plaque increases the risk for heart attack and stroke. You cannot feel your cholesterol level even if it is very high. The only way to know is by a blood test to check your lipid (fats) levels. Once you know your cholesterol levels, you should keep a record of the test results. Work with your caregiver to to  keep your levels in the desired range. WHAT THE RESULTS MEAN:  Total cholesterol is a rough measure of all the cholesterol in your blood.  LDL is the so-called bad cholesterol. This is the type that deposits cholesterol in the walls of the arteries. You want this level to be low.  HDL is the good cholesterol because it cleans the arteries and carries the LDL away. You want this level to be high.  Triglycerides are fat that the body can either burn for energy or store. High levels are closely linked to heart disease. DESIRED LEVELS:  Total cholesterol below 200.  LDL below 100 for people at risk, below 70 for very high risk.  HDL above 50 is good, above 60 is best.  Triglycerides below 150. HOW TO LOWER YOUR  CHOLESTEROL:  Diet.  Choose fish or white meat chicken and [place], roasted or baked. Limit fatty cuts of red meat, fried foods, and processed meats, such as sausage and lunch meat.  Eat lots of fresh fruits and vegetables. Choose whole grains, beans, pasta, potatoes and cereals.  Use only small amounts of olive, corn or canola oils. Avoid butter, mayonnaise, shortening or palm kernel oils. Avoid foods with trans-fats.  Use skim/nonfat milk and low-fat/nonfat yogurt and cheeses. Avoid whole milk, cream, ice cream, egg yolks and cheeses. Healthy desserts include angel food cake, ginger snaps, animal crackers, hard candy, popsicles, and low-fat/nonfat frozen yogurt. Avoid pastries, cakes, pies and cookies.  Exercise.  A regular program helps decrease LDL and raises HDL.  Helps with weight control.  Do things that increase your activity level like gardening, walking, or taking the stairs.  Medication.  May be prescribed by your caregiver to help lowering cholesterol and the risk for heart disease.  You may need medicine even if your levels are normal if you have several risk factors. HOME CARE INSTRUCTIONS   Follow your diet and exercise programs as suggested by your caregiver.  Take medications as directed.  Have blood work done when your caregiver feels it is necessary. MAKE SURE YOU:   Understand these instructions.  Will watch your condition.  Will get help right away if you are not doing well or get worse.      Vitamin D Deficiency Vitamin D is an important vitamin that your body needs. Having too little of it in your body is called a deficiency. A very bad deficiency can make your bones soft and can cause a condition called rickets.  Vitamin D is important to your body for different reasons, such as:   It helps your body absorb 2 minerals called calcium and phosphorus.  It helps make your bones healthy.  It may prevent some diseases, such as diabetes and multiple  sclerosis.  It helps your muscles and heart. You can get vitamin D in several ways. It is a natural part of some foods. The vitamin is also added to some dairy products and cereals. Some people take vitamin D supplements. Also, your body makes vitamin D when you are in the sun. It changes the sun's rays into a form of the vitamin that your body can use. CAUSES   Not eating enough foods that contain vitamin D.  Not getting enough sunlight.  Having certain digestive system diseases that make it hard to absorb vitamin D. These diseases include Crohn's disease, chronic pancreatitis, and cystic fibrosis.  Having a surgery in which part of the stomach or small intestine is removed.  Being obese. Fat cells pull  vitamin D out of your blood. That means that obese people may not have enough vitamin D left in their blood and in other body tissues.  Having chronic kidney or liver disease. RISK FACTORS Risk factors are things that make you more likely to develop a vitamin D deficiency. They include:  Being older.  Not being able to get outside very much.  Living in a nursing home.  Having had broken bones.  Having weak or thin bones (osteoporosis).  Having a disease or condition that changes how your body absorbs vitamin D.  Having dark skin.  Some medicines such as seizure medicines or steroids.  Being overweight or obese. SYMPTOMS Mild cases of vitamin D deficiency may not have any symptoms. If you have a very bad case, symptoms may include:  Bone pain.  Muscle pain.  Falling often.  Broken bones caused by a minor injury, due to osteoporosis. DIAGNOSIS A blood test is the best way to tell if you have a vitamin D deficiency. TREATMENT Vitamin D deficiency can be treated in different ways. Treatment for vitamin D deficiency depends on what is causing it. Options include:  Taking vitamin D supplements.  Taking a calcium supplement. Your caregiver will suggest what dose is best  for you. HOME CARE INSTRUCTIONS  Take any supplements that your caregiver prescribes. Follow the directions carefully. Take only the suggested amount.  Have your blood tested 2 months after you start taking supplements.  Eat foods that contain vitamin D. Healthy choices include:  Fortified dairy products, cereals, or juices. Fortified means vitamin D has been added to the food. Check the label on the package to be sure.  Fatty fish like salmon or trout.  Eggs.  Oysters.  Do not use a tanning bed.  Keep your weight at a healthy level. Lose weight if you need to.  Keep all follow-up appointments. Your caregiver will need to perform blood tests to make sure your vitamin D deficiency is going away. SEEK MEDICAL CARE IF:  You have any questions about your treatment.  You continue to have symptoms of vitamin D deficiency.  You have nausea or vomiting.  You are constipated.  You feel confused.  You have severe abdominal or back pain. MAKE SURE YOU:  Understand these instructions.  Will watch your condition.  Will get help right away if you are not doing well or get worse.

## 2014-04-19 NOTE — Progress Notes (Signed)
Patient ID: Brent Pickerel., male   DOB: [birth year], 80 y.o.   MRN: [id number]    This very nice 80 y.o. [name] presents for 3 month follow up with Hypertension, Hyperlipidemia, Pre-Diabetes and Vitamin D Deficiency. Patient also has Hx/o ProstateBladder Cancer with both suprapubic and indwelling urinary cathaters and followed by Dr Jasmine December. Patient was recently widowed and is still grieving. He is living with his caretaker daughter.   HTN predates since 2010. BP has been controlled and today's BP: 132/68 mmHg . Patient denies any cardiac type chest pain, palpitations, dyspnea/orthopnea/PND, dizziness, claudication, or dependent edema.   Hyperlipidemia is controlled with diet. Last lipid profile as below shows cholesterol at goal.  Lab Results  Component Value Date   CHOL 170 12/05/2013   HDL 58 12/05/2013   LDLCALC 75 12/05/2013   TRIG 187* 12/05/2013   CHOLHDL 2.9 12/05/2013    Also, the patient has history of PreDiabetes since 2010 and  last A1cwas 5.8% in Dec 2014. Patient denies any symptoms of reactive hypoglycemia, diabetic polys, paresthesias or visual blurring.   Further, Patient has history of Vitamin D Deficiency with last vitamin D of  65 in Aug 2014. Patient supplements vitamin D without any suspected side-effects.  Medication Sig  . acetaminophen (TYLENOL 8 HOUR) 650 MG  Take 650 mg by mouth every 8 (eight) hours as needed for pain.  Marland Kitchen b complex vitamins tablet Take 1 tablet by mouth daily.  . Cholecalciferol (VITAMIN D3) 2000 UNITS  Take 4,000 Units by mouth daily.   Marland Kitchen docusate sodium (COLACE) 100 MG capsule Take 200 mg by mouth 2 (two) times daily.  . Ensure Plus (ENSURE PLUS) LIQD Take 237 mLs by mouth 2 (two) times daily between meals.  . NORCO 10-325 MG  Take 1 tablet by mouth every 4 (four) hours as needed for pain.  Marland Kitchen LORazepam (ATIVAN) 1 MG tablet Take 1 tablet (1 mg total) by mouth 3 (three) times daily as needed.   . metoprolol tartrate (LOPRESSOR) 25 MG  Take 1  tablet (25 mg total) by mouth 2 (two) times daily.  . mirtazapine (REMERON) 30 MG tablet Take 15 mg by mouth at bedtime.   Marland Kitchen oxybutynin (DITROPAN) 5 MG tablet   . predniSONE (DELTASONE) 10 MG tablet 10 mg daily.   . Probiotic Product (PROBIOTIC DAILY PO) Take 1 capsule by mouth 1 day or 1 dose.  . sertraline (ZOLOFT) 50 MG tablet Takes 1 daily   No Known Allergies  PMHx:   Past Medical History  Diagnosis Date  . Pancreatitis   . Fractured hip 09/2011  . Prostate tumor     to have bx by Dr. Jasmine December  . UTI (lower urinary tract infection) 06/13/2012    has had x 3 months  . Falls 06/13/12    hit head approx. 1 month ago  . Unsteady gait 06/13/2012    "bad for awhile but getting worse recently"  . Slurred speech     slurred speech on 06/12/12 - didn't last long, slower talking recently  . Cancer     bladder  . Lung abnormality     spot on ct approx 3 months ago - to be followed - [facility]  . 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 Nose fracture     after a fall - Oct. 2012  . Hypertension   . Dysrhythmia     history of a-fib  - followed by Dr. Lovena Le   .  Osteopenia   . Fractures, stress     spine  . 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 Hallucinations     will hospitalized -  . Foley catheter in place   . Anxiety   . Ulcer of ankle     rt  . Atrial fibrillation     FHx:    Reviewed / unchanged  SHx:    Reviewed / unchanged   Systems Review: Constitutional: Denies fever, chills, wt changes, headaches, insomnia, fatigue, night sweats, change in appetite. Eyes: Denies redness, blurred vision, diplopia, discharge, itchy, watery eyes.  ENT: Denies discharge, congestion, post nasal drip, epistaxis, sore throat, earache, hearing loss, dental pain, tinnitus, vertigo, sinus pain, snoring.  CV: Denies chest pain, palpitations, irregular heartbeat, syncope, dyspnea, diaphoresis, orthopnea, PND, claudication, edema. Respiratory:  denies cough, dyspnea, DOE, pleurisy, hoarseness, laryngitis, wheezing.  Gastrointestinal: Denies dysphagia, odynophagia, heartburn, reflux, water brash, abdominal pain or cramps, nausea, vomiting, bloating, diarrhea, constipation, hematemesis, melena, hematochezia,  or hemorrhoids. Genitourinary: Denies dysuria, frequency, urgency, nocturia, hesitancy, discharge, hematuria, flank pain. Musculoskeletal: Denies arthralgias, myalgias, stiffness, jt. swelling, pain, limp, strain/sprain.  Skin: Denies pruritus, rash, hives, warts, acne, eczema, change in skin lesion(s). Neuro: No weakness, tremor, incoordination, spasms, paresthesia, or pain. Psychiatric: Denies confusion, memory loss, or sensory loss. Endo: Denies change in weight, skin, hair change.  Heme/Lymph: No excessive bleeding, bruising, orenlarged lymph nodes.   Exam:  BP 132/68  Pulse 72  Temp(Src) 98.1 F (36.7 C) (Temporal)  Resp 18  Ht 5\' 9"  (1.753 m)  Wt 160 lb 3.2 oz (72.666 kg)  BMI 23.65 kg/m2  Appears well nourished - in no distress. Eyes: PERRLA, EOMs, conjunctiva no swelling or erythema. Sinuses: No frontal/maxillary tenderness ENT/Mouth: EAC's clear, TM's nl w/o erythema, bulging. Nares clear w/o erythema, swelling, exudates. Oropharynx clear without erythema or exudates. Oral hygiene is good. Tongue normal, non obstructing. Hearing intact.  Neck: Supple. Thyroid nl. Car 2+/2+ without bruits, nodes or JVD. Chest: Respirations nl with BS clear & equal w/o rales, rhonchi, wheezing or stridor.  Cor: Heart sounds normal w/ regular rate and rhythm without sig. murmurs, gallops, clicks, or rubs. Peripheral pulses normal and equal  without edema.  Abdomen: Soft & bowel sounds normal. Non-tender w/o guarding, rebound, hernias, masses, or organomegaly.  Lymphatics: Unremarkable.  Musculoskeletal: Full ROM all peripheral extremities, joint stability, 5/5 strength, and normal gait.  Skin: Warm, dry without exposed rashes,  lesions, ecchymosis apparent.  Neuro: Cranial nerves intact, reflexes equal bilaterally. Sensory-motor testing grossly intact. Tendon reflexes grossly intact.  Pysch: Alert & oriented x 3. Insight and judgement nl & appropriate. No ideations.  Assessment and Plan:  1. Hypertension - Continue monitor blood pressure at home. Continue diet/meds same.  2. Hyperlipidemia - Continue diet, exercise,& lifestyle modifications. Continue monitor periodic cholesterol/liver & renal functions   3. Pre-diabetes - Continue diet, exercise, lifestyle modifications. Monitor appropriate labs.  4. Vitamin D Deficiency - Continue supplementation.  5. Hx / o Prostate & Bladder Cancer   Recommended regular exercise, BP monitoring, weight control, and discussed med and SE's. Recommended labs to assess and monitor clinical status. Further disposition pending results of labs.   Patient and daughter were advised to taper his prednisone 10 mg to QOD for 1 month the to cut down to 1/2 = 5 mg qod til next ov in 3 months. May be able to taper / D/C Remeron at next OV if he is more emotionally recovered.

## 2014-04-20 LAB — TSH: TSH: 1.569 u[IU]/mL (ref 0.350–4.500)

## 2014-04-20 LAB — HEPATIC FUNCTION PANEL
ALK PHOS: 63 U/L (ref 39–117)
ALT: 19 U/L (ref 0–53)
AST: 21 U/L (ref 0–37)
Albumin: 3.7 g/dL (ref 3.5–5.2)
BILIRUBIN DIRECT: 0.1 mg/dL (ref 0.0–0.3)
BILIRUBIN TOTAL: 0.5 mg/dL (ref 0.2–1.2)
Indirect Bilirubin: 0.4 mg/dL (ref 0.2–1.2)
Total Protein: 6.8 g/dL (ref 6.0–8.3)

## 2014-04-20 LAB — BASIC METABOLIC PANEL WITH GFR
BUN: 27 mg/dL — ABNORMAL HIGH (ref 6–23)
CHLORIDE: 103 meq/L (ref 96–112)
CO2: 24 meq/L (ref 19–32)
CREATININE: 1.28 mg/dL (ref 0.50–1.35)
Calcium: 9.5 mg/dL (ref 8.4–10.5)
GFR, EST NON AFRICAN AMERICAN: 51 mL/min — AB
GFR, Est African American: 59 mL/min — ABNORMAL LOW
Glucose, Bld: 75 mg/dL (ref 70–99)
Potassium: 4.8 mEq/L (ref 3.5–5.3)
Sodium: 138 mEq/L (ref 135–145)

## 2014-04-20 LAB — MAGNESIUM: Magnesium: 1.9 mg/dL (ref 1.5–2.5)

## 2014-04-20 LAB — VITAMIN D 25 HYDROXY (VIT D DEFICIENCY, FRACTURES): Vit D, 25-Hydroxy: 50 ng/mL (ref 30–89)

## 2014-04-20 LAB — INSULIN, FASTING: Insulin fasting, serum: 17 u[IU]/mL (ref 3–28)

## 2014-05-04 ENCOUNTER — Other Ambulatory Visit: Payer: Self-pay | Admitting: Urology

## 2014-05-04 DIAGNOSIS — N6320 Unspecified lump in the left breast, unspecified quadrant: Secondary | ICD-10-CM

## 2014-05-09 ENCOUNTER — Ambulatory Visit (INDEPENDENT_AMBULATORY_CARE_PROVIDER_SITE_OTHER): Payer: Medicare Other | Admitting: Physician Assistant

## 2014-05-09 ENCOUNTER — Encounter: Payer: Self-pay | Admitting: Physician Assistant

## 2014-05-09 VITALS — BP 110/60 | HR 76 | Temp 98.2°F | Resp 16 | Ht 69.0 in | Wt 157.0 lb

## 2014-05-09 DIAGNOSIS — I10 Essential (primary) hypertension: Secondary | ICD-10-CM

## 2014-05-09 DIAGNOSIS — L309 Dermatitis, unspecified: Secondary | ICD-10-CM

## 2014-05-09 DIAGNOSIS — R229 Localized swelling, mass and lump, unspecified: Secondary | ICD-10-CM

## 2014-05-09 DIAGNOSIS — C4441 Basal cell carcinoma of skin of scalp and neck: Secondary | ICD-10-CM

## 2014-05-09 MED ORDER — TRIAMCINOLONE ACETONIDE 0.1 % EX CREA: 1.0000 "application " | TOPICAL_CREAM | Freq: Two times a day (BID) | CUTANEOUS | Status: DC

## 2014-05-09 NOTE — Progress Notes (Signed)
HPI 80 y.o. male  presents for a face to face encounter.  His blood pressure has been controlled at home, today their BP is BP: 110/60 mmHg He has a suprapubic catheter in place,  he has a very supportive family, his daughter is here and he is now living with his son. His daughter is going on vacation to the beach and wants Home health to come out. She irrigates his foley and cleans his suprapubic foley site every other day and helps with day to day activities.  He has backed off his Remeron to 15mg  and only take 1/2 of the ativan and is doing better with fatigue during the day and not getting up at night.  He walks with a walker and it is difficult for him to leave him home due to this and with the catheter.    Current Medications:  Current Outpatient Prescriptions on File Prior to Visit  Medication Sig Dispense Refill  . acetaminophen (TYLENOL 8 HOUR) 650 MG CR tablet Take 650 mg by mouth every 8 (eight) hours as needed for pain.      Marland Kitchen b complex vitamins tablet Take 1 tablet by mouth daily.      . Cholecalciferol (VITAMIN D3) 2000 UNITS capsule Take 4,000 Units by mouth daily.       Marland Kitchen docusate sodium (COLACE) 100 MG capsule Take 200 mg by mouth 2 (two) times daily.      . Ensure Plus (ENSURE PLUS) LIQD Take 237 mLs by mouth 2 (two) times daily between meals.      Marland Kitchen HYDROcodone-acetaminophen (NORCO) 10-325 MG per tablet Take 1 tablet by mouth every 4 (four) hours as needed for pain.      Marland Kitchen LORazepam (ATIVAN) 1 MG tablet Take 1 tablet (1 mg total) by mouth 3 (three) times daily as needed. As needed for anxiety  Or sleep  90 tablet  0  . metoprolol tartrate (LOPRESSOR) 25 MG tablet Take 1 tablet (25 mg total) by mouth 2 (two) times daily.  60 tablet  11  . mirtazapine (REMERON) 30 MG tablet Take 15 mg by mouth at bedtime.       Marland Kitchen oxybutynin (DITROPAN) 5 MG tablet       . predniSONE (DELTASONE) 10 MG tablet 10 mg daily.       . Probiotic Product (PROBIOTIC DAILY PO) Take 1 capsule by mouth 1 day  or 1 dose.      . sertraline (ZOLOFT) 50 MG tablet Takes 1 daily       No current facility-administered medications on file prior to visit.   Medical History:  Past Medical History  Diagnosis Date  . Pancreatitis   . Fractured hip 09/2011  . Prostate tumor     to have bx by Dr. Jasmine December  . UTI (lower urinary tract infection) 06/13/2012    has had x 3 months  . Falls 06/13/12    hit head approx. 1 month ago  . Unsteady gait 06/13/2012    "bad for awhile but getting worse recently"  . Slurred speech     slurred speech on 06/12/12 - didn't last long, slower talking recently  . Cancer     bladder  . Lung abnormality     spot on ct approx 3 months ago - to be followed - [facility]  . 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 Nose fracture     after a fall - Oct. 2012  .  Hypertension   . Dysrhythmia     history of a-fib  - followed by Dr. Lovena Le   . Osteopenia   . Fractures, stress     spine  . 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 Hallucinations     will hospitalized -  . Foley catheter in place   . Anxiety   . Ulcer of ankle     rt  . Atrial fibrillation    Allergies: No Known Allergies   Review of Systems: [X]  = complains of  [ ]  = denies  General: Fatigue [ ]  Fever [ ]  Chills [ ]  Weakness [ ]   Insomnia [ ]  Eyes: Redness [ ]  Blurred vision [ ]  Diplopia [ ]   ENT: Congestion [ ]  Sinus Pain [ ]  Post Nasal Drip [ ]  Sore Throat [ ]  Earache [ ]   Cardiac: Chest pain/pressure [ ]  SOB [ ]  Orthopnea [ ]   Palpitations [ ]   Paroxysmal nocturnal dyspnea[ ]  Claudication [ ]  Edema [ ]   Pulmonary: Cough [ ]  Wheezing[ ]   SOB [ ]   Snoring [ ]   GI: Nausea [ ]  Vomiting[ ]  Dysphagia[ ]  Heartburn[ ]  Abdominal pain [ ]  Constipation [ ] ; Diarrhea [ ] ; BRBPR [ ]  Melena[ ]  GU: Hematuria[ ]  Dysuria [ ]  Nocturia[ ]  Urgency [ ]   Hesitancy [ ]  Discharge [ ]  Neuro: Headaches[ ]  Vertigo[ ]  Paresthesias[ ]  Spasm [ ]  Speech changes [ ]  Incoordination [ ]    Ortho: Arthritis [ ]  Joint pain [ ]  Muscle pain [ ]  Joint swelling [ ]  Back Pain [ ]  Skin:  Rash [ ]   Pruritis [ ]  Change in skin lesion [ ]   Psych: Depression[ ]  Anxiety[ ]  Confusion [ ]  Memory loss [ ]   Heme/Lypmh: Bleeding [ ]  Bruising [ ]  Enlarged lymph nodes [ ]   Endocrine: Visual blurring [ ]  Paresthesia [ ]  Polyuria [ ]  Polydypsea [ ]    Heat/cold intolerance [ ]  Hypoglycemia [ ]   Family history- Review and unchanged Social history- Review and unchanged Physical Exam: BP 110/60  Pulse 76  Temp(Src) 98.2 F (36.8 C)  Resp 16  Ht 5\' 9"  (1.753 m)  Wt 157 lb (71.215 kg)  BMI 23.17 kg/m2 Wt Readings from Last 3 Encounters:  05/09/14 157 lb (71.215 kg)  04/19/14 160 lb 3.2 oz (72.666 kg)  03/16/14 153 lb (69.4 kg)   HEENT: normocephalic, sclerae anicteric, TMs pearly, nares patent, no discharge or erythema, pharynx normal  Oral cavity: MMM, no lesions  Neck: supple, no lymphadenopathy, no thyromegaly, no masses  Heart: RRR, normal S1, S2  Lungs: CTA bilaterally, no wheezes, rhonchi, or rales  Abdomen: +bs, soft, non tender, non distended, with suprapubic catheter with slight serosanguinous discharge seen on bandage, no warmth Musculoskeletal: nontender, no swelling, no obvious deformity, walks with walker Extremities: no edema, no cyanosis, no clubbing  Pulses: 2+ symmetric, upper and lower extremities, normal cap refill  Neurological: alert, oriented x 3, CN2-12 intact, strength normal upper extremities and lower extremities, throughout, no cerebellar signs  Psychiatric: normal affect, very pleasant  Skin: on his left nostril and right temple 2 nodules scaly and erythematous, some scaly diffuse patches behind both ears and along neck line.    Assessment and Plan:  Face to face-patient has a difficult time leaving the home due to afib, and catheter- he would benefit from home health for catheter irrigation, cleaning of catheter wound every other day and replacement.  ?  Squamous vs Basal on nose-  3 freeze and thaw technique, if they return will send to Derm  Eczema- Triamcinolone cream  Continue diet and meds as discussed. Further disposition pending results of labs.  Vicie Mutters 11:37 AM

## 2014-05-10 ENCOUNTER — Other Ambulatory Visit: Payer: Self-pay | Admitting: Emergency Medicine

## 2014-05-10 MED ORDER — LORAZEPAM 1 MG PO TABS: 1.0000 mg | ORAL_TABLET | Freq: Three times a day (TID) | ORAL | Status: DC | PRN

## 2014-05-15 ENCOUNTER — Ambulatory Visit
Admission: RE | Admit: 2014-05-15 | Discharge: 2014-05-15 | Disposition: A | Discharge status: Home / Self Care | Payer: Medicare Other | Source: Ambulatory Visit | Attending: Urology | Admitting: Urology

## 2014-05-15 DIAGNOSIS — N6320 Unspecified lump in the left breast, unspecified quadrant: Secondary | ICD-10-CM

## 2014-05-15 DIAGNOSIS — I10 Essential (primary) hypertension: Secondary | ICD-10-CM

## 2014-05-15 DIAGNOSIS — Z435 Encounter for attention to cystostomy: Secondary | ICD-10-CM

## 2014-05-15 DIAGNOSIS — C689 Malignant neoplasm of urinary organ, unspecified: Secondary | ICD-10-CM

## 2014-05-15 DIAGNOSIS — I4891 Unspecified atrial fibrillation: Secondary | ICD-10-CM

## 2014-05-17 ENCOUNTER — Ambulatory Visit: Payer: Self-pay | Admitting: Internal Medicine

## 2014-06-21 ENCOUNTER — Other Ambulatory Visit: Payer: Self-pay

## 2014-06-21 MED ORDER — SERTRALINE HCL 100 MG PO TABS: 100.0000 mg | ORAL_TABLET | Freq: Every day | ORAL | Status: DC

## 2014-06-21 NOTE — Telephone Encounter (Signed)
Patient daughter Jackelyn Poling called and states that patients wife recently passed away and she wanted to know if we can increase Zoloft, per Vicie Mutters, PA okay to increase to 100 mg daily and he will need to schedule a follow up soon, new Rx sent to pharmacy, daughter made aware of RX sent and to follow up

## 2014-06-26 ENCOUNTER — Other Ambulatory Visit: Payer: Self-pay | Admitting: Internal Medicine

## 2014-06-26 ENCOUNTER — Other Ambulatory Visit: Payer: Self-pay | Admitting: Emergency Medicine

## 2014-06-26 MED ORDER — LORAZEPAM 1 MG PO TABS: 1.0000 mg | ORAL_TABLET | Freq: Three times a day (TID) | ORAL | Status: DC | PRN

## 2014-07-02 ENCOUNTER — Inpatient Hospital Stay (HOSPITAL_COMMUNITY)
Admission: EM | Admit: 2014-07-02 | Discharge: 2014-07-06 | DRG: 689 | Disposition: A | Discharge status: Home Health | Payer: Medicare Other | Attending: Internal Medicine | Admitting: Internal Medicine

## 2014-07-02 ENCOUNTER — Inpatient Hospital Stay (HOSPITAL_COMMUNITY): Payer: Medicare Other

## 2014-07-02 ENCOUNTER — Encounter (HOSPITAL_COMMUNITY): Payer: Self-pay | Admitting: Emergency Medicine

## 2014-07-02 DIAGNOSIS — Q619 Cystic kidney disease, unspecified: Secondary | ICD-10-CM | POA: Diagnosis not present

## 2014-07-02 DIAGNOSIS — Z436 Encounter for attention to other artificial openings of urinary tract: Secondary | ICD-10-CM | POA: Diagnosis not present

## 2014-07-02 DIAGNOSIS — M899 Disorder of bone, unspecified: Secondary | ICD-10-CM | POA: Diagnosis present

## 2014-07-02 DIAGNOSIS — C61 Malignant neoplasm of prostate: Secondary | ICD-10-CM | POA: Diagnosis present

## 2014-07-02 DIAGNOSIS — R339 Retention of urine, unspecified: Secondary | ICD-10-CM | POA: Diagnosis present

## 2014-07-02 DIAGNOSIS — I4891 Unspecified atrial fibrillation: Secondary | ICD-10-CM | POA: Diagnosis present

## 2014-07-02 DIAGNOSIS — Z66 Do not resuscitate: Secondary | ICD-10-CM | POA: Diagnosis present

## 2014-07-02 DIAGNOSIS — I10 Essential (primary) hypertension: Secondary | ICD-10-CM | POA: Diagnosis present

## 2014-07-02 DIAGNOSIS — G9340 Encephalopathy, unspecified: Secondary | ICD-10-CM

## 2014-07-02 DIAGNOSIS — F29 Unspecified psychosis not due to a substance or known physiological condition: Secondary | ICD-10-CM | POA: Diagnosis present

## 2014-07-02 DIAGNOSIS — D649 Anemia, unspecified: Secondary | ICD-10-CM | POA: Diagnosis present

## 2014-07-02 DIAGNOSIS — Z79899 Other long term (current) drug therapy: Secondary | ICD-10-CM | POA: Diagnosis not present

## 2014-07-02 DIAGNOSIS — N179 Acute kidney failure, unspecified: Secondary | ICD-10-CM | POA: Diagnosis present

## 2014-07-02 DIAGNOSIS — C679 Malignant neoplasm of bladder, unspecified: Secondary | ICD-10-CM | POA: Diagnosis present

## 2014-07-02 DIAGNOSIS — E669 Obesity, unspecified: Secondary | ICD-10-CM | POA: Diagnosis present

## 2014-07-02 DIAGNOSIS — R443 Hallucinations, unspecified: Secondary | ICD-10-CM | POA: Diagnosis present

## 2014-07-02 DIAGNOSIS — M949 Disorder of cartilage, unspecified: Secondary | ICD-10-CM | POA: Diagnosis present

## 2014-07-02 DIAGNOSIS — IMO0002 Reserved for concepts with insufficient information to code with codable children: Secondary | ICD-10-CM

## 2014-07-02 DIAGNOSIS — B9689 Other specified bacterial agents as the cause of diseases classified elsewhere: Secondary | ICD-10-CM | POA: Diagnosis present

## 2014-07-02 DIAGNOSIS — E860 Dehydration: Secondary | ICD-10-CM | POA: Diagnosis present

## 2014-07-02 DIAGNOSIS — N390 Urinary tract infection, site not specified: Principal | ICD-10-CM | POA: Diagnosis present

## 2014-07-02 DIAGNOSIS — R3989 Other symptoms and signs involving the genitourinary system: Secondary | ICD-10-CM | POA: Diagnosis present

## 2014-07-02 DIAGNOSIS — D6489 Other specified anemias: Secondary | ICD-10-CM | POA: Diagnosis present

## 2014-07-02 DIAGNOSIS — F17200 Nicotine dependence, unspecified, uncomplicated: Secondary | ICD-10-CM | POA: Diagnosis present

## 2014-07-02 DIAGNOSIS — Z96649 Presence of unspecified artificial hip joint: Secondary | ICD-10-CM

## 2014-07-02 DIAGNOSIS — R627 Adult failure to thrive: Secondary | ICD-10-CM | POA: Diagnosis present

## 2014-07-02 DIAGNOSIS — I482 Chronic atrial fibrillation, unspecified: Secondary | ICD-10-CM

## 2014-07-02 HISTORY — DX: Retention of urine, unspecified: R33.9

## 2014-07-02 LAB — CBC WITH DIFFERENTIAL/PLATELET
Basophils Absolute: 0 10*3/uL (ref 0.0–0.1)
Basophils Relative: 0 % (ref 0–1)
EOS ABS: 0 10*3/uL (ref 0.0–0.7)
Eosinophils Relative: 0 % (ref 0–5)
HCT: 36.9 % — ABNORMAL LOW (ref 39.0–52.0)
Hemoglobin: 12.1 g/dL — ABNORMAL LOW (ref 13.0–17.0)
LYMPHS ABS: 0.8 10*3/uL (ref 0.7–4.0)
LYMPHS PCT: 6 % — AB (ref 12–46)
MCH: 30.9 pg (ref 26.0–34.0)
MCHC: 32.8 g/dL (ref 30.0–36.0)
MCV: 94.1 fL (ref 78.0–100.0)
Monocytes Absolute: 0.9 10*3/uL (ref 0.1–1.0)
Monocytes Relative: 7 % (ref 3–12)
NEUTROS PCT: 87 % — AB (ref 43–77)
Neutro Abs: 11.3 10*3/uL — ABNORMAL HIGH (ref 1.7–7.7)
Platelets: 164 10*3/uL (ref 150–400)
RBC: 3.92 MIL/uL — AB (ref 4.22–5.81)
RDW: 14.6 % (ref 11.5–15.5)
WBC: 13.1 10*3/uL — AB (ref 4.0–10.5)

## 2014-07-02 LAB — BASIC METABOLIC PANEL
ANION GAP: 14 (ref 5–15)
BUN: 41 mg/dL — ABNORMAL HIGH (ref 6–23)
CALCIUM: 8.8 mg/dL (ref 8.4–10.5)
CO2: 20 mEq/L (ref 19–32)
Chloride: 103 mEq/L (ref 96–112)
Creatinine, Ser: 1.57 mg/dL — ABNORMAL HIGH (ref 0.50–1.35)
GFR calc Af Amer: 45 mL/min — ABNORMAL LOW (ref >90)
GFR, EST NON AFRICAN AMERICAN: 39 mL/min — AB (ref >90)
GLUCOSE: 146 mg/dL — AB (ref 70–99)
Potassium: 4.3 mEq/L (ref 3.7–5.3)
SODIUM: 137 meq/L (ref 137–147)

## 2014-07-02 LAB — URINALYSIS, ROUTINE W REFLEX MICROSCOPIC
Bilirubin Urine: NEGATIVE
Glucose, UA: NEGATIVE mg/dL
Ketones, ur: NEGATIVE mg/dL
NITRITE: POSITIVE — AB
PROTEIN: 30 mg/dL — AB
SPECIFIC GRAVITY, URINE: 1.017 (ref 1.005–1.030)
Urobilinogen, UA: 0.2 mg/dL (ref 0.0–1.0)
pH: 8.5 — ABNORMAL HIGH (ref 5.0–8.0)

## 2014-07-02 LAB — URINE MICROSCOPIC-ADD ON

## 2014-07-02 MED ORDER — CEFTRIAXONE SODIUM 1 G IJ SOLR
1.0000 g | INTRAMUSCULAR | Status: DC
  Administered 2014-07-03: 1 g via INTRAVENOUS
  Filled 2014-07-02: qty 10

## 2014-07-02 MED ORDER — SODIUM CHLORIDE 0.9 % IV SOLN
INTRAVENOUS | Status: DC
  Administered 2014-07-02: via INTRAVENOUS

## 2014-07-02 MED ORDER — SODIUM CHLORIDE 0.9 % IV BOLUS (SEPSIS)
1000.0000 mL | Freq: Once | INTRAVENOUS | Status: AC
  Administered 2014-07-02: 1000 mL via INTRAVENOUS

## 2014-07-02 NOTE — ED Notes (Signed)
Bed: WA04 Expected date:  Expected time:  Means of arrival:  Comments: EMS/80 yo male with possible UTI-blood in urine and low grade fever

## 2014-07-02 NOTE — ED Provider Notes (Signed)
CSN: [id number]     Arrival date & time 07/02/14  2124 History   First MD Initiated Contact with Patient 07/02/14 2135     Chief Complaint  Patient presents with  . Urinary Tract Infection     (Consider location/radiation/quality/duration/timing/severity/associated sxs/prior Treatment) HPI Comments: Patient brought to the ER by daughter for evaluation of sediment and blood in his urine. She reports that last night he developed a fever and was "out of it". Daughter has given Tylenol for the fever. Patient has a suprapubic catheter. He was scheduled to see his urologist tomorrow to have the catheter replaced. He is not experiencing any pain.  Patient is a 80 y.o. male presenting with urinary tract infection.  Urinary Tract Infection    Past Medical History  Diagnosis Date  . Pancreatitis   . Fractured hip 09/2011  . Prostate tumor     to have bx by Dr. Jasmine December  . UTI (lower urinary tract infection) 06/13/2012    has had x 3 months  . Falls 06/13/12    hit head approx. 1 month ago  . Unsteady gait 06/13/2012    "bad for awhile but getting worse recently"  . Slurred speech     slurred speech on 06/12/12 - didn't last long, slower talking recently  . Cancer     bladder  . Lung abnormality     spot on ct approx 3 months ago - to be followed - [facility]  . 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 Nose fracture     after a fall - Oct. 2012  . Hypertension   . Dysrhythmia     history of a-fib  - followed by Dr. Lovena Le   . Osteopenia   . Fractures, stress     spine  . 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 Hallucinations     will hospitalized -  . Foley catheter in place   . Anxiety   . Ulcer of ankle     rt  . Atrial fibrillation    Past Surgical History  Procedure Laterality Date  . Cholecystectomy    . Partial hip arthroplasty    . Cystoscopy      has yearly due to bladder cancer  . Retinal detachment surgery      at least  3 and have been in both eyes  . Transurethral resection of bladder tumor    . Transurethral resection of prostate  01/18/2013    Procedure: TRANSURETHRAL RESECTION OF THE PROSTATE WITH GYRUS INSTRUMENTS;  Surgeon: Molli Hazard, MD;  Location: [facility];  Service: Urology;  Laterality: N/A;  CYSTOSCOPY, SUPRAPUBIC TUBE PLACEMENT, CHANNEL TURP, RECTAL EXAM    . Insertion of suprapubic catheter  01/18/2013    Procedure: INSERTION OF SUPRAPUBIC CATHETER;  Surgeon: Molli Hazard, MD;  Location: [facility];  Service: Urology;  Laterality: N/A;  . Cystoscopy  01/18/2013    Procedure: CYSTOSCOPY;  Surgeon: Molli Hazard, MD;  Location: [facility];  Service: Urology;  Laterality: N/A;   Family History  Problem Relation Age of Onset  . Heart disease      No family history  . Pancreatitis Mother   . Alcohol abuse Father    History  Substance Use Topics  . Smoking status: Current Every Day Smoker -- 0.25 packs/day for 62 years    Types: Cigarettes  . Smokeless tobacco: Not on file  . Alcohol Use: No  Review of Systems  Genitourinary: Positive for hematuria.  All other systems reviewed and are negative.     Allergies  Review of patient's allergies indicates no known allergies.  Home Medications   Prior to Admission medications   Medication Sig Start Date End Date Taking? Authorizing Provider  b complex vitamins tablet Take 1 tablet by mouth daily.   Yes Historical Provider, MD  Cholecalciferol (VITAMIN D3) 2000 UNITS capsule Take 4,000 Units by mouth daily.    Yes Historical Provider, MD  docusate sodium (COLACE) 100 MG capsule Take 200 mg by mouth 2 (two) times daily.   Yes Historical Provider, MD  Ensure Plus (ENSURE PLUS) LIQD Take 237 mLs by mouth 2 (two) times daily between meals.   Yes Historical Provider, MD  LORazepam (ATIVAN) 0.5 MG tablet Take 0.5-1 mg by mouth 2 (two) times daily. 0.5mg  in the morning and 1mg  at bedtime   Yes Historical Provider, MD  metoprolol  tartrate (LOPRESSOR) 25 MG tablet Take 1 tablet (25 mg total) by mouth 2 (two) times daily. 12/11/13  Yes Vicie Mutters, PA-C  mirtazapine (REMERON) 30 MG tablet Take 30 mg by mouth at bedtime.   Yes Historical Provider, MD  oxybutynin (DITROPAN) 5 MG tablet Take 5 mg by mouth daily as needed for bladder spasms.  11/06/13  Yes Historical Provider, MD  predniSONE (DELTASONE) 10 MG tablet Take 5 mg by mouth daily.  10/11/13  Yes Historical Provider, MD  Probiotic Product (PROBIOTIC DAILY PO) Take 1 capsule by mouth daily.    Yes Historical Provider, MD  sertraline (ZOLOFT) 50 MG tablet Take 50 mg by mouth daily.   Yes Historical Provider, MD  triamcinolone cream (KENALOG) 0.1 % Apply 1 application topically 2 (two) times daily. 05/09/14  Yes Vicie Mutters, PA-C   BP 112/50  Pulse 80  Temp(Src) 97.9 F (36.6 C) (Oral)  Resp 24  SpO2 98% Physical Exam  Constitutional: He is oriented to person, place, and time. He appears well-developed and well-nourished. No distress.  HENT:  Head: Normocephalic and atraumatic.  Right Ear: Hearing normal.  Left Ear: Hearing normal.  Nose: Nose normal.  Mouth/Throat: Oropharynx is clear and moist and mucous membranes are normal.  Eyes: Conjunctivae and EOM are normal. Pupils are equal, round, and reactive to light.  Neck: Normal range of motion. Neck supple.  Cardiovascular: Regular rhythm, S1 normal and S2 normal.  Exam reveals no gallop and no friction rub.   No murmur heard. Pulmonary/Chest: Effort normal and breath sounds normal. No respiratory distress. He exhibits no tenderness.  Abdominal: Soft. Normal appearance and bowel sounds are normal. There is no hepatosplenomegaly. There is no tenderness. There is no rebound, no guarding, no tenderness at McBurney's point and negative Murphy's sign. No hernia.  Musculoskeletal: Normal range of motion.  Neurological: He is alert and oriented to person, place, and time. He has normal strength. No cranial nerve  deficit or sensory deficit. Coordination normal. GCS eye subscore is 4. GCS verbal subscore is 5. GCS motor subscore is 6.  Skin: Skin is warm, dry and intact. No rash noted. No cyanosis.  Psychiatric: He has a normal mood and affect. His speech is normal and behavior is normal. Thought content normal.    ED Course  Procedures (including critical care time) Labs Review Labs Reviewed  URINALYSIS, ROUTINE W REFLEX MICROSCOPIC - Abnormal; Notable for the following:    APPearance TURBID (*)    pH 8.5 (*)    Hgb urine dipstick MODERATE (*)  Protein, ur 30 (*)    Nitrite POSITIVE (*)    Leukocytes, UA LARGE (*)    All other components within normal limits  CBC WITH DIFFERENTIAL - Abnormal; Notable for the following:    WBC 13.1 (*)    RBC 3.92 (*)    Hemoglobin 12.1 (*)    HCT 36.9 (*)    Neutrophils Relative % 87 (*)    Neutro Abs 11.3 (*)    Lymphocytes Relative 6 (*)    All other components within normal limits  BASIC METABOLIC PANEL - Abnormal; Notable for the following:    Glucose, Bld 146 (*)    BUN 41 (*)    Creatinine, Ser 1.57 (*)    GFR calc non Af Amer 39 (*)    GFR calc Af Amer 45 (*)    All other components within normal limits  URINE MICROSCOPIC-ADD ON - Abnormal; Notable for the following:    Bacteria, UA MANY (*)    Crystals TRIPLE PHOSPHATE CRYSTALS (*)    All other components within normal limits  URINE CULTURE  CULTURE, BLOOD (ROUTINE X 2)  CULTURE, BLOOD (ROUTINE X 2)    Imaging Review No results found.   EKG Interpretation   Date/Time:  Monday July 02 2014 21:31:49 EDT Ventricular Rate:  67 PR Interval:  157 QRS Duration: 129 QT Interval:  471 QTC Calculation: 497 R Axis:   69 Text Interpretation:  Sinus or ectopic atrial rhythm Nonspecific  intraventricular conduction delay Abnormal lateral Q waves No significant  change since last tracing Confirmed by Letina Luckett  MD, Davidson Palmieri [phone]) on  07/02/2014 10:24:11 PM      MDM   Final  diagnoses:  UTI (lower urinary tract infection)  AKI (acute kidney injury)    Patient presents to ER for evaluation of hematuria, sediment in the ER and in the setting of a suprapubic catheter. The patient has been running a fever for the last 2 days, giving Tylenol for treatment. He has been confused and exhibiting signs of delirium at home. Urinalysis does show obvious infection, culture pending. Patient with fever, delirium, leukocytosis, and dehydration with mild acute kidney injury. Will ask hospitalist to admit the patient.  Orpah Greek, MD 07/02/14 [phone]

## 2014-07-02 NOTE — ED Notes (Signed)
Patient arrives via [facility] from home, where patient lives with his daughter (who is a home Programmer, applications) Patient with hematuria for 3-4 days, also with low grade fever--daughter has been treating with antipyretics PRN Per EMS, patient is supposed to be seen by urology "sometime this week" for these complaints

## 2014-07-03 ENCOUNTER — Encounter (HOSPITAL_COMMUNITY): Payer: Self-pay | Admitting: Internal Medicine

## 2014-07-03 ENCOUNTER — Inpatient Hospital Stay (HOSPITAL_COMMUNITY): Payer: Medicare Other

## 2014-07-03 DIAGNOSIS — I4891 Unspecified atrial fibrillation: Secondary | ICD-10-CM

## 2014-07-03 DIAGNOSIS — G9340 Encephalopathy, unspecified: Secondary | ICD-10-CM

## 2014-07-03 DIAGNOSIS — N390 Urinary tract infection, site not specified: Principal | ICD-10-CM

## 2014-07-03 DIAGNOSIS — N179 Acute kidney failure, unspecified: Secondary | ICD-10-CM

## 2014-07-03 LAB — COMPREHENSIVE METABOLIC PANEL
ALT: 25 U/L (ref 0–53)
AST: 71 U/L — AB (ref 0–37)
Albumin: 2.7 g/dL — ABNORMAL LOW (ref 3.5–5.2)
Alkaline Phosphatase: 54 U/L (ref 39–117)
Anion gap: 14 (ref 5–15)
BUN: 36 mg/dL — ABNORMAL HIGH (ref 6–23)
CALCIUM: 8.6 mg/dL (ref 8.4–10.5)
CO2: 20 meq/L (ref 19–32)
CREATININE: 1.41 mg/dL — AB (ref 0.50–1.35)
Chloride: 103 mEq/L (ref 96–112)
GFR calc Af Amer: 51 mL/min — ABNORMAL LOW (ref >90)
GFR calc non Af Amer: 44 mL/min — ABNORMAL LOW (ref >90)
Glucose, Bld: 111 mg/dL — ABNORMAL HIGH (ref 70–99)
Potassium: 4.3 mEq/L (ref 3.7–5.3)
SODIUM: 137 meq/L (ref 137–147)
TOTAL PROTEIN: 6.4 g/dL (ref 6.0–8.3)
Total Bilirubin: 0.4 mg/dL (ref 0.3–1.2)

## 2014-07-03 LAB — CBC
HCT: 36.6 % — ABNORMAL LOW (ref 39.0–52.0)
HEMOGLOBIN: 12 g/dL — AB (ref 13.0–17.0)
MCH: 31.2 pg (ref 26.0–34.0)
MCHC: 32.8 g/dL (ref 30.0–36.0)
MCV: 95.1 fL (ref 78.0–100.0)
Platelets: 156 10*3/uL (ref 150–400)
RBC: 3.85 MIL/uL — AB (ref 4.22–5.81)
RDW: 14.7 % (ref 11.5–15.5)
WBC: 13 10*3/uL — AB (ref 4.0–10.5)

## 2014-07-03 LAB — MRSA PCR SCREENING: MRSA BY PCR: NEGATIVE

## 2014-07-03 MED ORDER — SERTRALINE HCL 50 MG PO TABS
50.0000 mg | ORAL_TABLET | Freq: Every day | ORAL | Status: DC
  Administered 2014-07-03 – 2014-07-06 (×4): 50 mg via ORAL
  Filled 2014-07-03 (×4): qty 1

## 2014-07-03 MED ORDER — BOOST PLUS PO LIQD
237.0000 mL | Freq: Two times a day (BID) | ORAL | Status: DC
  Administered 2014-07-03 – 2014-07-06 (×7): 237 mL via ORAL
  Filled 2014-07-03 (×8): qty 237

## 2014-07-03 MED ORDER — MIRTAZAPINE 30 MG PO TABS
30.0000 mg | ORAL_TABLET | Freq: Every day | ORAL | Status: DC
  Administered 2014-07-03 – 2014-07-05 (×3): 30 mg via ORAL
  Filled 2014-07-03 (×4): qty 1

## 2014-07-03 MED ORDER — ACETAMINOPHEN 325 MG PO TABS: 650.0000 mg | ORAL_TABLET | Freq: Four times a day (QID) | ORAL | Status: DC | PRN

## 2014-07-03 MED ORDER — VITAMIN D 1000 UNITS PO TABS
4000.0000 [IU] | ORAL_TABLET | Freq: Every day | ORAL | Status: DC
  Administered 2014-07-03 – 2014-07-06 (×4): 4000 [IU] via ORAL
  Filled 2014-07-03 (×4): qty 4

## 2014-07-03 MED ORDER — ONDANSETRON HCL 4 MG PO TABS: 4.0000 mg | ORAL_TABLET | Freq: Four times a day (QID) | ORAL | Status: DC | PRN

## 2014-07-03 MED ORDER — PREDNISONE 5 MG PO TABS
5.0000 mg | ORAL_TABLET | Freq: Every day | ORAL | Status: DC
Start: 2014-07-03 — End: 2014-07-06
  Administered 2014-07-03 – 2014-07-06 (×4): 5 mg via ORAL
  Filled 2014-07-03 (×4): qty 1

## 2014-07-03 MED ORDER — METOPROLOL TARTRATE 25 MG PO TABS
25.0000 mg | ORAL_TABLET | Freq: Two times a day (BID) | ORAL | Status: DC
  Administered 2014-07-03 – 2014-07-06 (×7): 25 mg via ORAL
  Filled 2014-07-03 (×8): qty 1

## 2014-07-03 MED ORDER — FA-PYRIDOXINE-CYANOCOBALAMIN 2.5-25-2 MG PO TABS
1.0000 | ORAL_TABLET | Freq: Every day | ORAL | Status: DC
  Administered 2014-07-03 – 2014-07-06 (×4): 1 via ORAL
  Filled 2014-07-03 (×4): qty 1

## 2014-07-03 MED ORDER — LORAZEPAM 1 MG PO TABS
1.0000 mg | ORAL_TABLET | Freq: Every day | ORAL | Status: DC
  Administered 2014-07-03 – 2014-07-05 (×3): 1 mg via ORAL
  Filled 2014-07-03 (×3): qty 1

## 2014-07-03 MED ORDER — ACETAMINOPHEN 650 MG RE SUPP: 650.0000 mg | Freq: Four times a day (QID) | RECTAL | Status: DC | PRN

## 2014-07-03 MED ORDER — OXYBUTYNIN CHLORIDE 5 MG PO TABS
5.0000 mg | ORAL_TABLET | Freq: Every day | ORAL | Status: DC | PRN
  Filled 2014-07-03: qty 1

## 2014-07-03 MED ORDER — TRIAMCINOLONE ACETONIDE 0.1 % EX CREA
1.0000 "application " | TOPICAL_CREAM | Freq: Two times a day (BID) | CUTANEOUS | Status: DC
  Administered 2014-07-04 – 2014-07-06 (×3): 1 via TOPICAL
  Filled 2014-07-03: qty 15

## 2014-07-03 MED ORDER — SODIUM CHLORIDE 0.9 % IV SOLN
250.0000 mg | Freq: Four times a day (QID) | INTRAVENOUS | Status: DC
  Administered 2014-07-03 – 2014-07-05 (×9): 250 mg via INTRAVENOUS
  Filled 2014-07-03 (×10): qty 250

## 2014-07-03 MED ORDER — SODIUM CHLORIDE 0.9 % IV SOLN
INTRAVENOUS | Status: AC
  Administered 2014-07-03 (×3): via INTRAVENOUS

## 2014-07-03 MED ORDER — ENOXAPARIN SODIUM 40 MG/0.4ML ~~LOC~~ SOLN
40.0000 mg | SUBCUTANEOUS | Status: DC
  Administered 2014-07-03 – 2014-07-06 (×4): 40 mg via SUBCUTANEOUS
  Filled 2014-07-03 (×4): qty 0.4

## 2014-07-03 MED ORDER — DOCUSATE SODIUM 100 MG PO CAPS
200.0000 mg | ORAL_CAPSULE | Freq: Two times a day (BID) | ORAL | Status: DC
  Administered 2014-07-03 – 2014-07-06 (×4): 200 mg via ORAL
  Filled 2014-07-03 (×8): qty 2

## 2014-07-03 MED ORDER — LORAZEPAM 0.5 MG PO TABS
0.5000 mg | ORAL_TABLET | Freq: Every day | ORAL | Status: DC
  Administered 2014-07-04 – 2014-07-06 (×2): 0.5 mg via ORAL
  Filled 2014-07-03 (×2): qty 1

## 2014-07-03 MED ORDER — ONDANSETRON HCL 4 MG/2ML IJ SOLN: 4.0000 mg | Freq: Four times a day (QID) | INTRAMUSCULAR | Status: DC | PRN

## 2014-07-03 NOTE — Progress Notes (Signed)
ANTIBIOTIC CONSULT NOTE - INITIAL  Pharmacy Consult for primaxin Indication: UTI  No Known Allergies  Patient Measurements: Height: 5\' 11"  (180.3 cm) Weight: 157 lb (71.215 kg) IBW/kg (Calculated) : 75.3 Adjusted Body Weight:   Vital Signs: Temp: 100.5 F (38.1 C) (07/14 0246) Temp src: Oral (07/14 0246) BP: 111/72 mmHg (07/14 0246) Pulse Rate: 99 (07/14 0246) Intake/Output from previous day:   Intake/Output from this shift:    Labs:  Recent Labs  07/02/14 2148  WBC 13.1*  HGB 12.1*  PLT 164  CREATININE 1.57*   Estimated Creatinine Clearance: 34.6 ml/min (by C-G formula based on Cr of 1.57). No results found for this basename: VANCOTROUGH, VANCOPEAK, VANCORANDOM, GENTTROUGH, GENTPEAK, GENTRANDOM, TOBRATROUGH, TOBRAPEAK, TOBRARND, AMIKACINPEAK, AMIKACINTROU, AMIKACIN,  in the last 72 hours   Microbiology: No results found for this or any previous visit (from the past 720 hour(s)).  Medical History: Past Medical History  Diagnosis Date  . Pancreatitis   . Fractured hip 09/2011  . Prostate tumor     to have bx by Dr. Jasmine December  . UTI (lower urinary tract infection) 06/13/2012    has had x 3 months  . Falls 06/13/12    hit head approx. 1 month ago  . Unsteady gait 06/13/2012    "bad for awhile but getting worse recently"  . Slurred speech     slurred speech on 06/12/12 - didn't last long, slower talking recently  . Cancer     bladder  . Lung abnormality     spot on ct approx 3 months ago - to be followed - [facility]  . 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 Nose fracture     after a fall - Oct. 2012  . Hypertension   . Dysrhythmia     history of a-fib  - followed by Dr. Lovena Le   . Osteopenia   . Fractures, stress     spine  . 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 Hallucinations     will hospitalized -  . Foley catheter in place   . Anxiety   . Ulcer of ankle     rt  . Atrial fibrillation      Medications:  Anti-infectives   Start     Dose/Rate Route Frequency Ordered Stop   07/03/14 0315  imipenem-cilastatin (PRIMAXIN) 250 mg in sodium chloride 0.9 % 100 mL IVPB     250 mg 200 mL/hr over 30 Minutes Intravenous 4 times per day 07/03/14 0311     07/02/14 2345  cefTRIAXone (ROCEPHIN) 1 g in dextrose 5 % 50 mL IVPB     1 g 100 mL/hr over 30 Minutes Intravenous Every 24 hours 07/02/14 2339       Assessment: Patient with UTI.    Goal of Therapy:  Primaxin dosed based on patient weight and renal function   Plan:  Follow up culture results Priamxin 250mg  iv q6hr  Tyler Deis, Shea Stakes Crowford 07/03/2014,3:11 AM

## 2014-07-03 NOTE — H&P (Signed)
[facility] History and Physical  Brent Day. CHY:[id number] DOB: 03/05/1927 DOA: 07/02/2014  Referring physician: ER physician. PCP: Brent Richards, MD   Chief Complaint: Confusion and fever. History obtained from patient's daughter.  HPI: Brent Day. is a 80 y.o. male with history of bladder cancer and prostate cancer on suprapubic catheter, atrial fibrillation not on anticoagulation secondary to falls was brought to the ER after patient was found to be having increasing confusion and hallucinations with fever over the last 2 days. Patient's daughter also states that patient had one episode of nausea vomiting. Over the last one week patient's daughter noticed that patient's urine was getting increasingly cloudy. In the ER patient was found to have UA compatible with UTI. Patient has chronic indwelling suprapubic catheter secondary to outlet obstruction. On exam patient is obese alert and awake and follows commands. CT head did not show anything acute. Urine cultures were obtained and patient has been started on antibiotics for UTI. On exam patient is complaining of right flank and upper quadrant pain. CT abdomen and pelvis is pending.   Review of Systems: As presented in the history of presenting illness, rest negative.  Past Medical History  Diagnosis Date  . Pancreatitis   . Fractured hip 09/2011  . Prostate tumor     to have bx by Dr. Jasmine December  . UTI (lower urinary tract infection) 06/13/2012    has had x 3 months  . Falls 06/13/12    hit head approx. 1 month ago  . Unsteady gait 06/13/2012    "bad for awhile but getting worse recently"  . Slurred speech     slurred speech on 06/12/12 - didn't last long, slower talking recently  . Cancer     bladder  . Lung abnormality     spot on ct approx 3 months ago - to be followed - [facility]  . 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 Nose fracture     after a fall - Oct. 2012  . Hypertension    . Dysrhythmia     history of a-fib  - followed by Dr. Lovena Le   . Osteopenia   . Fractures, stress     spine  . 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 Hallucinations     will hospitalized -  . Foley catheter in place   . Anxiety   . Ulcer of ankle     rt  . Atrial fibrillation    Past Surgical History  Procedure Laterality Date  . Cholecystectomy    . Partial hip arthroplasty    . Cystoscopy      has yearly due to bladder cancer  . Retinal detachment surgery      at least 3 and have been in both eyes  . Transurethral resection of bladder tumor    . Transurethral resection of prostate  01/18/2013    Procedure: TRANSURETHRAL RESECTION OF THE PROSTATE WITH GYRUS INSTRUMENTS;  Surgeon: Molli Hazard, MD;  Location: [facility];  Service: Urology;  Laterality: N/A;  CYSTOSCOPY, SUPRAPUBIC TUBE PLACEMENT, CHANNEL TURP, RECTAL EXAM    . Insertion of suprapubic catheter  01/18/2013    Procedure: INSERTION OF SUPRAPUBIC CATHETER;  Surgeon: Molli Hazard, MD;  Location: [facility];  Service: Urology;  Laterality: N/A;  . Cystoscopy  01/18/2013    Procedure: CYSTOSCOPY;  Surgeon: Molli Hazard, MD;  Location: [facility];  Service: Urology;  Laterality: N/A;   Social History:  reports that he has been smoking Cigarettes.  He has a 15.5 pack-year smoking history. He does not have any smokeless tobacco history on file. He reports that he does not drink alcohol or use illicit drugs. Where does patient live home. Can patient participate in ADLs? Not sure.  No Known Allergies  Family History:  Family History  Problem Relation Age of Onset  . Heart disease      No family history  . Pancreatitis Mother   . Alcohol abuse Father       Prior to Admission medications   Medication Sig Start Date End Date Taking? Authorizing Provider  b complex vitamins tablet Take 1 tablet by mouth daily.   Yes Historical Provider, MD  Cholecalciferol (VITAMIN D3) 2000  UNITS capsule Take 4,000 Units by mouth daily.    Yes Historical Provider, MD  docusate sodium (COLACE) 100 MG capsule Take 200 mg by mouth 2 (two) times daily.   Yes Historical Provider, MD  Ensure Plus (ENSURE PLUS) LIQD Take 237 mLs by mouth 2 (two) times daily between meals.   Yes Historical Provider, MD  LORazepam (ATIVAN) 0.5 MG tablet Take 0.5-1 mg by mouth 2 (two) times daily. 0.5mg  in the morning and 1mg  at bedtime   Yes Historical Provider, MD  metoprolol tartrate (LOPRESSOR) 25 MG tablet Take 1 tablet (25 mg total) by mouth 2 (two) times daily. 12/11/13  Yes Vicie Mutters, PA-C  mirtazapine (REMERON) 30 MG tablet Take 30 mg by mouth at bedtime.   Yes Historical Provider, MD  oxybutynin (DITROPAN) 5 MG tablet Take 5 mg by mouth daily as needed for bladder spasms.  11/06/13  Yes Historical Provider, MD  predniSONE (DELTASONE) 10 MG tablet Take 5 mg by mouth daily.  10/11/13  Yes Historical Provider, MD  Probiotic Product (PROBIOTIC DAILY PO) Take 1 capsule by mouth daily.    Yes Historical Provider, MD  sertraline (ZOLOFT) 50 MG tablet Take 50 mg by mouth daily.   Yes Historical Provider, MD  triamcinolone cream (KENALOG) 0.1 % Apply 1 application topically 2 (two) times daily. 05/09/14  Yes Vicie Mutters, PA-C    Physical Exam: Filed Vitals:   07/02/14 2132 07/02/14 2215 07/03/14 0007 07/03/14 0246  BP: 112/50   111/72  Pulse: 66 80 69 99  Temp: 97.9 F (36.6 C)   100.5 F (38.1 C)  TempSrc: Oral   Oral  Resp: 30 24 18 18   Height:    5\' 11"  (1.803 m)  Weight:    71.215 kg (157 lb)  SpO2: 94% 98% 94% 99%     General:  Well-developed and nourished.  Eyes:  Anicteric no pallor.  ENT: No discharge from the ears eyes nose mouth.  Neck: No mass felt.  Cardiovascular: S1-S2 heard.  Respiratory: No rhonchi or crepitations.  Abdomen: Soft mild tenderness in the right flank area. No guarding or rigidity. Has suprapubic catheter.  Skin: Multiple bruises on the skin upper  and lower extremities.  Musculoskeletal: No edema.  Psychiatric: Presently alert awake and follows commands.  Neurologic: Alert awake and follows commands and moves all extremities.  Labs on Admission:  Basic Metabolic Panel:  Recent Labs Lab 07/02/14 2148  NA 137  K 4.3  CL 103  CO2 20  GLUCOSE 146*  BUN 41*  CREATININE 1.57*  CALCIUM 8.8   Liver Function Tests: No results found for this basename: AST, ALT, ALKPHOS, BILITOT, PROT, ALBUMIN,  in the last 168  hours No results found for this basename: LIPASE, AMYLASE,  in the last 168 hours No results found for this basename: AMMONIA,  in the last 168 hours CBC:  Recent Labs Lab 07/02/14 2148  WBC 13.1*  NEUTROABS 11.3*  HGB 12.1*  HCT 36.9*  MCV 94.1  PLT 164   Cardiac Enzymes: No results found for this basename: CKTOTAL, CKMB, CKMBINDEX, TROPONINI,  in the last 168 hours  BNP (last 3 results) No results found for this basename: PROBNP,  in the last 8760 hours CBG: No results found for this basename: GLUCAP,  in the last 168 hours  Radiological Exams on Admission: Ct Abdomen Pelvis Wo Contrast  07/03/2014   CLINICAL DATA:  Check for hydronephrosis.  Urinary tract infection  EXAM: CT ABDOMEN AND PELVIS WITHOUT CONTRAST  TECHNIQUE: Multidetector CT imaging of the abdomen and pelvis was performed following the standard protocol without IV contrast.  COMPARISON:  04/08/2012  FINDINGS: LOWER CHEST: Bilateral gynecomastia.  Mild dependent atelectasis.  ABDOMEN/PELVIS:  Liver: Stable appearance of numerous liver cysts.  Biliary: Cholecystectomy.  No evidence of biliary obstruction.  Pancreas: Stable appearance of extensive coarse calcification within in the pancreatic body and tail, with atrophy. No evidence of active inflammation or progression from 2013.  Spleen: Unremarkable.  Adrenals: Stable mild thickening of the inferior left gland.  Kidneys and ureters: Stable appearance of a indeterminate density exophytic lesion from  the interpolar left kidney measuring 14 mm. This is a complex cyst based on previous MR imaging report. There may be new layering milk of calcium within the cyst. There is a large cyst within the interpolar right kidney, herniating into the sinus, measuring 5.3 cm. No hydronephrosis or nephrolithiasis.  Bladder: Suprapubic catheter which is in good position. Evaluation of the bladder is limited by decompressed state and streak artifact from right hip prosthesis.  Reproductive: No acute findings  Bowel: No obstruction. Negative appendix.  Retroperitoneum: No mass or adenopathy.  Peritoneum: No ascites or pneumoperitoneum.  Vascular: 2.9 cm infrarenal abdominal aortic aneurysm which is unchanged from 2013. There is extensive atherosclerosis of the aorta and branch vessels.  OSSEOUS: Remote L1 compression fracture with 50-60% height loss. Compression deformities of the L2 superior endplate which is stable from 10/05/2013. New from 2014 is a L3 inferior plate fracture. No unhealed fracture line identified. There is chronic L4-5 anterolisthesis related to advanced facet osteoarthritis. Chronic L5-S1 disc calcification. Right hip hemiarthroplasty; no acute adverse features.  IMPRESSION: 1. No hydronephrosis or other acute intra-abdominal findings. 2. L3 inferior endplate fracture is new from 10/05/2013, but favored remote. 3. Chronic findings stable from 2013 are noted above.   Electronically Signed   By: Jorje Guild M.D.   On: 07/03/2014 02:45   Ct Head Wo Contrast  07/03/2014   CLINICAL DATA:  Confusion.  EXAM: CT HEAD WITHOUT CONTRAST  TECHNIQUE: Contiguous axial images were obtained from the base of the skull through the vertex without intravenous contrast.  COMPARISON:  Prior CT from 10/15/2011.  FINDINGS: Atrophy with advanced chronic microvascular ischemic disease is present.  There is no acute intracranial hemorrhage or infarct. No mass lesion or midline shift. Gray-white matter differentiation is well  maintained. Ventricles are normal in size without evidence of hydrocephalus. CSF containing spaces are within normal limits. No extra-axial fluid collection.  The calvarium is intact.  Orbital soft tissues are within normal limits.  The paranasal sinuses and mastoid air cells are well pneumatized and free of fluid.  Scalp soft tissues are unremarkable.  IMPRESSION: 1. No acute intracranial abnormality. 2. Atrophy with advanced chronic microvascular ischemic disease.   Electronically Signed   By: Jeannine Boga M.D.   On: 07/03/2014 00:28   Dg Chest Port 1 View  07/03/2014   CLINICAL DATA:  Fever  EXAM: PORTABLE CHEST - 1 VIEW  COMPARISON:  September 18, 2013  FINDINGS: There is no edema or consolidation. The heart size is upper normal with pulmonary vascularity within normal limits. No adenopathy. No bone lesions. Mild mediastinal prominence is probably due to great vessel prominence in this age group.  IMPRESSION: No edema or consolidation.   Electronically Signed   By: Lowella Grip M.D.   On: 07/03/2014 02:01     Assessment/Plan Principal Problem:   Acute encephalopathy Active Problems:   Atrial fibrillation   UTI (lower urinary tract infection)   AKI (acute kidney injury)   1. Acute encephalopathy most likely secondary to UTI - patient has been placed on Primaxin for complicated UTI. Follow urine cultures. Patient has chronic indwelling suprapubic catheter. 2. Acute renal failure - closely follow intake output and metabolic panel. Gently hydrate. 3. Atrial fibrillation - presently rate controlled. Patient not on anticoagulation secondary to falls. Continue metoprolol. 4. Anemia - follow CBC. 5. History of bladder cancer and prostate cancer - per urologist.    Code Status: DO NOT RESUSCITATE.  Family Communication: Patient's daughter.  Disposition Plan: Admit to inpatient.    Garrette Caine N. [facility] Pager [phone].  If 7PM-7AM, please contact  night-coverage [email or web address] Password [email or web address] 07/03/2014, 3:01 AM

## 2014-07-03 NOTE — Care Management Note (Addendum)
    Page 1 of 2   07/05/2014     2:26:36 PM CARE MANAGEMENT NOTE 07/05/2014  Patient:  Brent Day, Brent Day   Account Number:  [id number]  Date Initiated:  07/03/2014  Documentation initiated by:  [facility]  Subjective/Objective Assessment:   adm: Confusion and fever/UTI     Action/Plan:   discharge planning   Anticipated DC Date:  07/06/2014   Anticipated DC Plan:  [place]  CM consult      [facility] Choice  HOME HEALTH   Choice offered to / List presented to:  C-1 Patient        [place] arranged  HH-3 OT  HH-1 RN  [place]      [place] agency  [place]   Status of service:  Completed, signed off Medicare Important Message given?  YES (If response is "NO", the following Medicare IM given date fields will be blank) Date Medicare IM given:   Medicare IM given by:   Date Additional Medicare IM given:  07/05/2014 Additional Medicare IM given by:  [facility] Ayaat Jansma  Discharge Disposition:  [place]  Per UR Regulation:  Reviewed for med. necessity/level of care/duration of stay  If discussed at [place] of Stay Meetings, dates discussed:    Comments:  07/05/14 13:50 CM spoke with son of pt, Brent Day, who is very upset his father is not convalescing in the hospital.  CM explained his father has reached medical stability  and is ready to move on to the next level of recuperation which is home health or short term skilled nursing facility.  Neither option was suitable to son.  Son states he is going to the beach and feels his father should stay here until he gets back.  After a lengthly conversation, [place] requests a call from the MD.  MD notified. No other CM needs were communicated.  Brent Day, BSN, Brent Day [phone].   07/05/14 11:55 CM spoke with both Brent and Brent Day (daughters of pt) who state pt will go home to [place] and grandaughter, Brent Day  [phone] will be taking care of pt while the rest of the family is at the beach.  Pt will go to [facility], [place], [place] [address] at discharge.  Brent requests Brent Day for [facility] services.  CM confirmed [facility] with pt who states it's fine.  Brent also requests Brent Day to be [facility] but understands this may not be possible bc of staffing but request will be made with referral.  [facility] called to notify pt will be going with [facility] per family request.  Referral called into [facility] rep, Jada for HHPT/OT/Aide/RN/SW.  Brent Day requests facesheet, [id number], orders, evals, H&P, pertinent progress notes be faxed to [facility].  No other CM needs were communicated.  Brent Day, BSN, [place].   07/03/14 12:00 CM finds pt asleep and speaks with his daughter (who he lives with), Brent Day, [phone] who states pt's baseline is clear-headed, ambulates indp. (though uses his walker occasionally for safety).  Pt woke later and CM offered choice of Home health agency if it is requeired at discharge and pt chooses [facility].  Referral texted to [facility] to place pt on hotlist for probable [facility] services. Will continue to monitor for disposition.  Brent Day, BSN, CM [phone].

## 2014-07-03 NOTE — ED Notes (Signed)
Report given to floor All questions answered Patient to have CT abdomen prior to transporting upstairs

## 2014-07-03 NOTE — ED Notes (Signed)
Portable CXR at bedside.

## 2014-07-03 NOTE — ED Notes (Signed)
Admitting MD at bedside.

## 2014-07-03 NOTE — Progress Notes (Signed)
I have seen and assessed the patient and agree with Dr. Moise Boring assessment and plan. We'll consult with urology for a change of suprapubic catheter. Continue empiric IV antibiotics while awaiting urine cultures.

## 2014-07-03 NOTE — Progress Notes (Signed)
INITIAL NUTRITION ASSESSMENT  DOCUMENTATION CODES Per approved criteria  -Not Applicable   INTERVENTION: -Continue with Boost Plus BID -Encouraged PO intake; consider liberalizing diet to promote appetite -Will continue to monitor  NUTRITION DIAGNOSIS: Inadequate oral intake related to confusion/lethargy as evidenced by PO intake <75%.   Goal: Pt to meet >/= 90% of their estimated nutrition needs    Monitor:  Total protein/energy intake, labs, weights, diet order  Reason for Assessment: MST  80 y.o. male  Admitting Dx: Acute encephalopathy  ASSESSMENT: Brent Vasudevan. is a 80 y.o. male with history of bladder cancer and prostate cancer on suprapubic catheter, atrial fibrillation not on anticoagulation secondary to falls was brought to the ER after patient was found to be having increasing confusion and hallucinations with fever over the last 2 days  -Pt currently very lethargic, and unable to provide significant food/nutrition related hx.  -Denied any recent changes in weight or appetite; however pt with 0% PO intake of breakfast tray during RD assessment. Has Boost Plus BID ordered, pt appeared to have consumed 25% of one supplement, likely with medications. Pt noted he consumes 1-2 [employer] supplements daily -Offered alternative breakfast to encourage appetite; however pt declined. NT also noted pt refusing baths d/t lethargy. Consider diet liberalization if no further improvement in PO intake -Appears to have gained 7-10 lbs in past 6 months per previous medical records. Some mild muscle wasting and subcutaneous fat loss noted; however this is likely d/t aging process and much improvement is seen from previous RD assessment in 12/2013 Nutrition Focused Physical Exam:  Subcutaneous Fat:  Orbital Region: WDL Upper Arm Region: mild depletion Thoracic and Lumbar Region: n/a  Muscle:  Temple Region: mild Clavicle Bone Region: WDL Clavicle and Acromion Bone Region:  WDL Scapular Bone Region: WDL Dorsal Hand: mild Patellar Region: WDL Anterior Thigh Region: mild Posterior Calf Region: mild  Edema: none noted     Height: Ht Readings from Last 1 Encounters:  07/03/14 5\' 11"  (1.803 m)    Weight: Wt Readings from Last 1 Encounters:  07/03/14 157 lb (71.215 kg)    Ideal Body Weight: 172 lbs  % Ideal Body Weight: 91%  Wt Readings from Last 10 Encounters:  07/03/14 157 lb (71.215 kg)  05/09/14 157 lb (71.215 kg)  04/19/14 160 lb 3.2 oz (72.666 kg)  03/16/14 153 lb (69.4 kg)  01/18/14 151 lb (68.493 kg)  12/05/13 150 lb (68.04 kg)  10/25/13 145 lb 9.6 oz (66.044 kg)  09/18/13 146 lb (66.225 kg)  08/15/13 146 lb (66.225 kg)  01/18/13 137 lb (62.143 kg)    Usual Body Weight: 150-160 per previous medical records  % Usual Body Weight: 100%  BMI:  Body mass index is 21.91 kg/(m^2).  Estimated Nutritional Needs: Kcal: 1800-2000 Protein: 85-95 gram Fluid: >/=1800 ml/daily  Skin: WDL  Diet Order: Cardiac  EDUCATION NEEDS: -No education needs identified at this time   Intake/Output Summary (Last 24 hours) at 07/03/14 1148 Last data filed at 07/03/14 0500  Gross per 24 hour  Intake      0 ml  Output    800 ml  Net   -800 ml    Last BM: 7/13   Labs:   Recent Labs Lab 07/02/14 2148 07/03/14 0426  NA 137 137  K 4.3 4.3  CL 103 103  CO2 20 20  BUN 41* 36*  CREATININE 1.57* 1.41*  CALCIUM 8.8 8.6  GLUCOSE 146* 111*    CBG (last 3)  No results found for this basename: GLUCAP,  in the last 72 hours  Scheduled Meds: . cholecalciferol  4,000 Units Oral Daily  . docusate sodium  200 mg Oral BID  . enoxaparin (LOVENOX) injection  40 mg Subcutaneous Q24H  . folic acid-pyridoxine-cyancobalamin  1 tablet Oral Daily  . imipenem-cilastatin  250 mg Intravenous 4 times per day  . lactose free nutrition  237 mL Oral BID BM  . LORazepam  0.5 mg Oral Daily  . LORazepam  1 mg Oral QHS  . metoprolol tartrate  25 mg Oral BID   . mirtazapine  30 mg Oral QHS  . predniSONE  5 mg Oral Daily  . sertraline  50 mg Oral Daily  . triamcinolone cream  1 application Topical BID    Continuous Infusions: . sodium chloride 100 mL/hr at 07/03/14 0330    Past Medical History  Diagnosis Date  . Pancreatitis   . Fractured hip 09/2011  . Prostate tumor     to have bx by Dr. Jasmine December  . UTI (lower urinary tract infection) 06/13/2012    has had x 3 months  . Falls 06/13/12    hit head approx. 1 month ago  . Unsteady gait 06/13/2012    "bad for awhile but getting worse recently"  . Slurred speech     slurred speech on 06/12/12 - didn't last long, slower talking recently  . Cancer     bladder  . Lung abnormality     spot on ct approx 3 months ago - to be followed - [facility]  . 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 Nose fracture     after a fall - Oct. 2012  . Hypertension   . Dysrhythmia     history of a-fib  - followed by Dr. Lovena Le   . Osteopenia   . Fractures, stress     spine  . 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 Hallucinations     will hospitalized -  . Foley catheter in place   . Anxiety   . Ulcer of ankle     rt  . Atrial fibrillation     Past Surgical History  Procedure Laterality Date  . Cholecystectomy    . Partial hip arthroplasty    . Cystoscopy      has yearly due to bladder cancer  . Retinal detachment surgery      at least 3 and have been in both eyes  . Transurethral resection of bladder tumor    . Transurethral resection of prostate  01/18/2013    Procedure: TRANSURETHRAL RESECTION OF THE PROSTATE WITH GYRUS INSTRUMENTS;  Surgeon: Molli Hazard, MD;  Location: [facility];  Service: Urology;  Laterality: N/A;  CYSTOSCOPY, SUPRAPUBIC TUBE PLACEMENT, CHANNEL TURP, RECTAL EXAM    . Insertion of suprapubic catheter  01/18/2013    Procedure: INSERTION OF SUPRAPUBIC CATHETER;  Surgeon: Molli Hazard, MD;  Location: [facility];  Service:  Urology;  Laterality: N/A;  . Cystoscopy  01/18/2013    Procedure: CYSTOSCOPY;  Surgeon: Molli Hazard, MD;  Location: [facility];  Service: Urology;  Laterality: N/A;    Atlee Abide MS RD LDN Clinical Dietitian [phone]

## 2014-07-03 NOTE — ED Notes (Signed)
Patient transported to CT 

## 2014-07-03 NOTE — ED Notes (Signed)
Floor nurse in patient's room Call back number given

## 2014-07-03 NOTE — ED Notes (Signed)
Patient back from CT dept

## 2014-07-03 NOTE — Consult Note (Signed)
Reason for Consult: Urinary Retention, Prostate Cancer, Acute Renal Failure, Right Renal Cyst  Referring Physician: Irine Seal MD  Brent Pickerel. is an 80 y.o. male.   HPI:   1 - Urinary Retention - pt with long h/o retention now dependant on SP tube (62F) chanced Q monthly at [facility] office. Failed trial of void after TURP.   2 - High Risk Prostate Cancer - Multifocal Gleason 9 adenocarcinoma. No advanced / distant disease by imaging 2015. Currently on palliative mode of treatment with intermitant androgen deprivation for new symptoms and none since 2013. Recent PSA 1.45 2015.  3 -  Acute Renal Failure - baseline Cr 1-1.2 range. Cr 1.57 on admit. CT this admission without hydro and bladder decompressed.  4 - Right Renal Cyst - partially complex Rt renal cyst seen on periodic imaging. Not candidate for any further evalution / treatment.   5 - Bacteruria, Low-Grade Fever - UA on admit with chronic bacteruria also low-grade temps <101. CT w/o hydro / abscess / renal stranding.   Today "Brent Day" is seen as inpatient consult for above, specifically to chance SP Tube as he is overdue. Currently admitted for FTT / mental status changes and low grade fever.  Past Medical History  Diagnosis Date  . Pancreatitis   . Fractured hip 09/2011  . Prostate tumor     to have bx by Dr. Jasmine December  . UTI (lower urinary tract infection) 06/13/2012    has had x 3 months  . Falls 06/13/12    hit head approx. 1 month ago  . Unsteady gait 06/13/2012    "bad for awhile but getting worse recently"  . Slurred speech     slurred speech on 06/12/12 - didn't last long, slower talking recently  . Cancer     bladder  . Lung abnormality     spot on ct approx 3 months ago - to be followed - [facility]  . 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 Nose fracture     after a fall - Oct. 2012  . Hypertension   . Dysrhythmia     history of a-fib  - followed by Dr. Lovena Le   . Osteopenia    . Fractures, stress     spine  . 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 Hallucinations     will hospitalized -  . Foley catheter in place   . Anxiety   . Ulcer of ankle     rt  . Atrial fibrillation     Past Surgical History  Procedure Laterality Date  . Cholecystectomy    . Partial hip arthroplasty    . Cystoscopy      has yearly due to bladder cancer  . Retinal detachment surgery      at least 3 and have been in both eyes  . Transurethral resection of bladder tumor    . Transurethral resection of prostate  01/18/2013    Procedure: TRANSURETHRAL RESECTION OF THE PROSTATE WITH GYRUS INSTRUMENTS;  Surgeon: Molli Hazard, MD;  Location: [facility];  Service: Urology;  Laterality: N/A;  CYSTOSCOPY, SUPRAPUBIC TUBE PLACEMENT, CHANNEL TURP, RECTAL EXAM    . Insertion of suprapubic catheter  01/18/2013    Procedure: INSERTION OF SUPRAPUBIC CATHETER;  Surgeon: Molli Hazard, MD;  Location: [facility];  Service: Urology;  Laterality: N/A;  . Cystoscopy  01/18/2013    Procedure: CYSTOSCOPY;  Surgeon: Dennard Schaumann  Jasmine December, MD;  Location: [facility];  Service: Urology;  Laterality: N/A;    Family History  Problem Relation Age of Onset  . Heart disease      No family history  . Pancreatitis Mother   . Alcohol abuse Father     Social History:  reports that he has been smoking Cigarettes.  He has a 15.5 pack-year smoking history. He does not have any smokeless tobacco history on file. He reports that he does not drink alcohol or use illicit drugs.  Allergies: No Known Allergies  Medications: I have reviewed the patient's current medications.  Results for orders placed during the hospital encounter of 07/02/14 (from the past 48 hour(s))  CBC WITH DIFFERENTIAL     Status: Abnormal   Collection Time    07/02/14  9:48 PM      Result Value Ref Range   WBC 13.1 (*) 4.0 - 10.5 K/uL   RBC 3.92 (*) 4.22 - 5.81 MIL/uL   Hemoglobin 12.1 (*) 13.0 - 17.0 g/dL    HCT 36.9 (*) 39.0 - 52.0 %   MCV 94.1  78.0 - 100.0 fL   MCH 30.9  26.0 - 34.0 pg   MCHC 32.8  30.0 - 36.0 g/dL   RDW 14.6  11.5 - 15.5 %   Platelets 164  150 - 400 K/uL   Neutrophils Relative % 87 (*) 43 - 77 %   Neutro Abs 11.3 (*) 1.7 - 7.7 K/uL   Lymphocytes Relative 6 (*) 12 - 46 %   Lymphs Abs 0.8  0.7 - 4.0 K/uL   Monocytes Relative 7  3 - 12 %   Monocytes Absolute 0.9  0.1 - 1.0 K/uL   Eosinophils Relative 0  0 - 5 %   Eosinophils Absolute 0.0  0.0 - 0.7 K/uL   Basophils Relative 0  0 - 1 %   Basophils Absolute 0.0  0.0 - 0.1 K/uL  BASIC METABOLIC PANEL     Status: Abnormal   Collection Time    07/02/14  9:48 PM      Result Value Ref Range   Sodium 137  137 - 147 mEq/L   Potassium 4.3  3.7 - 5.3 mEq/L   Chloride 103  96 - 112 mEq/L   CO2 20  19 - 32 mEq/L   Glucose, Bld 146 (*) 70 - 99 mg/dL   BUN 41 (*) 6 - 23 mg/dL   Creatinine, Ser 1.57 (*) 0.50 - 1.35 mg/dL   Calcium 8.8  8.4 - 10.5 mg/dL   GFR calc non Af Amer 39 (*) >90 mL/min   GFR calc Af Amer 45 (*) >90 mL/min   Comment: (NOTE)     The eGFR has been calculated using the CKD EPI equation.     This calculation has not been validated in all clinical situations.     eGFR's persistently <90 mL/min signify possible Chronic Kidney     Disease.   Anion gap 14  5 - 15  URINALYSIS, ROUTINE W REFLEX MICROSCOPIC     Status: Abnormal   Collection Time    07/02/14 10:39 PM      Result Value Ref Range   Color, Urine YELLOW  YELLOW   APPearance TURBID (*) CLEAR   Specific Gravity, Urine 1.017  1.005 - 1.030   pH 8.5 (*) 5.0 - 8.0   Glucose, UA NEGATIVE  NEGATIVE mg/dL   Hgb urine dipstick MODERATE (*) NEGATIVE   Bilirubin Urine NEGATIVE  NEGATIVE  Ketones, ur NEGATIVE  NEGATIVE mg/dL   Protein, ur 30 (*) NEGATIVE mg/dL   Urobilinogen, UA 0.2  0.0 - 1.0 mg/dL   Nitrite POSITIVE (*) NEGATIVE   Leukocytes, UA LARGE (*) NEGATIVE  URINE MICROSCOPIC-ADD ON     Status: Abnormal   Collection Time    07/02/14 10:39  PM      Result Value Ref Range   WBC, UA OBSCURED BY BACTERIA AND MUCOUS  <3 WBC/hpf   RBC / HPF OBSCURED BY BACTERIA AND MUCOUS  <3 RBC/hpf   Bacteria, UA MANY (*) RARE   Crystals TRIPLE PHOSPHATE CRYSTALS (*) NEGATIVE   Urine-Other MUCOUS PRESENT     Comment: LESS THAN 10 mL OF URINE SUBMITTED     URINALYSIS PERFORMED ON SUPERNATANT     MICROSCOPIC EXAM PERFORMED ON UNCONCENTRATED URINE  COMPREHENSIVE METABOLIC PANEL     Status: Abnormal   Collection Time    07/03/14  4:26 AM      Result Value Ref Range   Sodium 137  137 - 147 mEq/L   Potassium 4.3  3.7 - 5.3 mEq/L   Chloride 103  96 - 112 mEq/L   CO2 20  19 - 32 mEq/L   Glucose, Bld 111 (*) 70 - 99 mg/dL   BUN 36 (*) 6 - 23 mg/dL   Creatinine, Ser 1.41 (*) 0.50 - 1.35 mg/dL   Calcium 8.6  8.4 - 10.5 mg/dL   Total Protein 6.4  6.0 - 8.3 g/dL   Albumin 2.7 (*) 3.5 - 5.2 g/dL   AST 71 (*) 0 - 37 U/L   ALT 25  0 - 53 U/L   Alkaline Phosphatase 54  39 - 117 U/L   Total Bilirubin 0.4  0.3 - 1.2 mg/dL   GFR calc non Af Amer 44 (*) >90 mL/min   GFR calc Af Amer 51 (*) >90 mL/min   Comment: (NOTE)     The eGFR has been calculated using the CKD EPI equation.     This calculation has not been validated in all clinical situations.     eGFR's persistently <90 mL/min signify possible Chronic Kidney     Disease.   Anion gap 14  5 - 15  CBC     Status: Abnormal   Collection Time    07/03/14  4:26 AM      Result Value Ref Range   WBC 13.0 (*) 4.0 - 10.5 K/uL   RBC 3.85 (*) 4.22 - 5.81 MIL/uL   Hemoglobin 12.0 (*) 13.0 - 17.0 g/dL   HCT 36.6 (*) 39.0 - 52.0 %   MCV 95.1  78.0 - 100.0 fL   MCH 31.2  26.0 - 34.0 pg   MCHC 32.8  30.0 - 36.0 g/dL   RDW 14.7  11.5 - 15.5 %   Platelets 156  150 - 400 K/uL  MRSA PCR SCREENING     Status: None   Collection Time    07/03/14 10:50 AM      Result Value Ref Range   MRSA by PCR NEGATIVE  NEGATIVE   Comment:            The GeneXpert MRSA Assay (FDA     approved for NASAL specimens      only), is one component of a     comprehensive MRSA colonization     surveillance program. It is not     intended to diagnose MRSA     infection nor to guide or  monitor treatment for     MRSA infections.    Ct Abdomen Pelvis Wo Contrast  07/03/2014   CLINICAL DATA:  Check for hydronephrosis.  Urinary tract infection  EXAM: CT ABDOMEN AND PELVIS WITHOUT CONTRAST  TECHNIQUE: Multidetector CT imaging of the abdomen and pelvis was performed following the standard protocol without IV contrast.  COMPARISON:  04/08/2012  FINDINGS: LOWER CHEST: Bilateral gynecomastia.  Mild dependent atelectasis.  ABDOMEN/PELVIS:  Liver: Stable appearance of numerous liver cysts.  Biliary: Cholecystectomy.  No evidence of biliary obstruction.  Pancreas: Stable appearance of extensive coarse calcification within in the pancreatic body and tail, with atrophy. No evidence of active inflammation or progression from 2013.  Spleen: Unremarkable.  Adrenals: Stable mild thickening of the inferior left gland.  Kidneys and ureters: Stable appearance of a indeterminate density exophytic lesion from the interpolar left kidney measuring 14 mm. This is a complex cyst based on previous MR imaging report. There may be new layering milk of calcium within the cyst. There is a large cyst within the interpolar right kidney, herniating into the sinus, measuring 5.3 cm. No hydronephrosis or nephrolithiasis.  Bladder: Suprapubic catheter which is in good position. Evaluation of the bladder is limited by decompressed state and streak artifact from right hip prosthesis.  Reproductive: No acute findings  Bowel: No obstruction. Negative appendix.  Retroperitoneum: No mass or adenopathy.  Peritoneum: No ascites or pneumoperitoneum.  Vascular: 2.9 cm infrarenal abdominal aortic aneurysm which is unchanged from 2013. There is extensive atherosclerosis of the aorta and branch vessels.  OSSEOUS: Remote L1 compression fracture with 50-60% height loss.  Compression deformities of the L2 superior endplate which is stable from 10/05/2013. New from 2014 is a L3 inferior plate fracture. No unhealed fracture line identified. There is chronic L4-5 anterolisthesis related to advanced facet osteoarthritis. Chronic L5-S1 disc calcification. Right hip hemiarthroplasty; no acute adverse features.  IMPRESSION: 1. No hydronephrosis or other acute intra-abdominal findings. 2. L3 inferior endplate fracture is new from 10/05/2013, but favored remote. 3. Chronic findings stable from 2013 are noted above.   Electronically Signed   By: Jorje Guild M.D.   On: 07/03/2014 02:45   Ct Head Wo Contrast  07/03/2014   CLINICAL DATA:  Confusion.  EXAM: CT HEAD WITHOUT CONTRAST  TECHNIQUE: Contiguous axial images were obtained from the base of the skull through the vertex without intravenous contrast.  COMPARISON:  Prior CT from 10/15/2011.  FINDINGS: Atrophy with advanced chronic microvascular ischemic disease is present.  There is no acute intracranial hemorrhage or infarct. No mass lesion or midline shift. Gray-white matter differentiation is well maintained. Ventricles are normal in size without evidence of hydrocephalus. CSF containing spaces are within normal limits. No extra-axial fluid collection.  The calvarium is intact.  Orbital soft tissues are within normal limits.  The paranasal sinuses and mastoid air cells are well pneumatized and free of fluid.  Scalp soft tissues are unremarkable.  IMPRESSION: 1. No acute intracranial abnormality. 2. Atrophy with advanced chronic microvascular ischemic disease.   Electronically Signed   By: Jeannine Boga M.D.   On: 07/03/2014 00:28   Dg Chest Port 1 View  07/03/2014   CLINICAL DATA:  Fever  EXAM: PORTABLE CHEST - 1 VIEW  COMPARISON:  September 18, 2013  FINDINGS: There is no edema or consolidation. The heart size is upper normal with pulmonary vascularity within normal limits. No adenopathy. No bone lesions. Mild mediastinal  prominence is probably due to great vessel prominence in this age group.  IMPRESSION: No edema or consolidation.   Electronically Signed   By: Lowella Grip M.D.   On: 07/03/2014 02:01    Review of Systems  Constitutional: Positive for fever and malaise/fatigue.  HENT: Negative.   Eyes: Negative.   Respiratory: Negative.   Cardiovascular: Negative.   Gastrointestinal: Negative for nausea, vomiting and abdominal pain.  Genitourinary: Positive for dysuria. Negative for flank pain.  Musculoskeletal: Negative.   Skin: Negative.   Neurological: Positive for weakness.  Endo/Heme/Allergies: Bruises/bleeds easily.  Psychiatric/Behavioral: Positive for memory loss.   Blood pressure 117/51, pulse 81, temperature 97.6 F (36.4 C), temperature source Oral, resp. rate 18, height _0  (1.803 m), weight 71.215 kg (157 lb), SpO2 93.00%. Physical Exam  Constitutional:  Frail, elderly, no distress.  HENT:  Head: Normocephalic and atraumatic.  Eyes: Pupils are equal, round, and reactive to light.  Neck: Normal range of motion. Neck supple.  Cardiovascular: Normal rate.   Respiratory: Effort normal.  GI: Soft. Bowel sounds are normal.  Genitourinary: Penis normal.  SP Tube in position with some cloudy yellow urine.   Musculoskeletal: Normal range of motion.  Neurological: He is alert.  AO x 2.   Skin: Skin is warm and dry.  Psychiatric: His behavior is normal.  Stigmata of dementia   Using aseptic technique, SP tube exchanged for new 44F SP tube and tubing with 10cc sterile water in balloon. Irrigated quantitatively with 80cc NS confirming position.   Assessment/Plan:  1 - Urinary Retention - SP tube changed as per above. Continue Qmonthly changes at Urology office for now. Alternatively could be performed by [facility] RN if pt's condition deteriorates.   2 - High Risk Prostate Cancer - New imaging this admission confirms likely localized, agree with palliate management with prn androgen  deprivation for which there is presently no indication.  3 -  Acute Renal Failure - likely poor PO intake driven / pre-renal. No hydro on imaging this admission.   4 - Right Renal Cyst -  Not candidate for any further evalution / treatment, approximately stable by imaging this admission.   5 - Bacteruria, Low-Grade Fever - Unclear if UTI as source of fevers / confusion, but agree with empiric treatment. No drainable fluid collestions / hydro to necessitate more aggressive intervention.  6 - Will follow prn while in house, call with questions.    Brent Day 07/03/2014, 12:43 PM

## 2014-07-04 ENCOUNTER — Encounter (HOSPITAL_COMMUNITY): Payer: Self-pay | Admitting: Internal Medicine

## 2014-07-04 DIAGNOSIS — R339 Retention of urine, unspecified: Secondary | ICD-10-CM | POA: Diagnosis present

## 2014-07-04 HISTORY — DX: Retention of urine, unspecified: R33.9

## 2014-07-04 MED ORDER — SODIUM CHLORIDE 0.9 % IV SOLN
INTRAVENOUS | Status: DC
  Filled 2014-07-04: qty 1000

## 2014-07-04 MED ORDER — SODIUM CHLORIDE 0.9 % IV SOLN
INTRAVENOUS | Status: AC
  Administered 2014-07-04 – 2014-07-05 (×3): via INTRAVENOUS

## 2014-07-04 NOTE — Progress Notes (Signed)
[facility] PROGRESS NOTE  Brent Day. ZWC:[id number] DOB: 01-20-1927 DOA: 07/02/2014 PCP: Alesia Richards, MD  Assessment/Plan: #1 acute encephalopathy Metabolic in nature secondary to urinary tract infection. Patient with clinical improvement. Urine cultures pending. Continue empiric IV Primaxin. Follow.  #2 urinary tract infection Urine cultures are pending. Continue IV Primaxin.  #3 urinary retention status post suprapubic catheter Suprapubic catheter has been exchanged by urology. Appreciate urology input and recommendations.  #4 history of prostate cancer Outpatient followup with urology.  #5 acute renal insufficiency Secondary to prerenal azotemia. Renal function improving daily with hydration. Follow.  #6 atrial fibrillation Currently rate controlled on metoprolol. Patient is not an anticoagulation candidate secondary to falls.  #7 anemia H&H stable. Follow.  #8 prophylaxis Lovenox for DVT prophylaxis.  Code Status: Full Family Communication: Updated patient. Spoke with daughter via telephone. Disposition Plan: Home when medically stable.   Consultants:  Neurology: Dr. Tresa Moore 07/03/2014  Procedures:  Exchange of suprapubic catheter per Dr. Tresa Moore 07/03/2014  CT abdomen and pelvis 07/03/2014  CT head 07/03/2014  Chest x-ray 07/03/2014  Antibiotics:  IV Primaxin 07/03/2014  HPI/Subjective: Patient with no complaints. Patient sleeping, easily arousable. Per nursing patient tolerating diet.  Objective: Filed Vitals:   07/04/14 0516  BP: 141/50  Pulse: 100  Temp: 98 F (36.7 C)  Resp: 18    Intake/Output Summary (Last 24 hours) at 07/04/14 1733 Last data filed at 07/04/14 0849  Gross per 24 hour  Intake   1720 ml  Output   1700 ml  Net     20 ml   Filed Weights   07/03/14 0246  Weight: 71.215 kg (157 lb)    Exam:   General:  NAD  Cardiovascular: RRR  Respiratory: CTAB anterior lung fields.  Abdomen: Soft,  nontender, nondistended, positive bowel sounds  Musculoskeletal: No clubbing cyanosis or edema  Data Reviewed: Basic Metabolic Panel:  Recent Labs Lab 07/02/14 2148 07/03/14 0426  NA 137 137  K 4.3 4.3  CL 103 103  CO2 20 20  GLUCOSE 146* 111*  BUN 41* 36*  CREATININE 1.57* 1.41*  CALCIUM 8.8 8.6   Liver Function Tests:  Recent Labs Lab 07/03/14 0426  AST 71*  ALT 25  ALKPHOS 54  BILITOT 0.4  PROT 6.4  ALBUMIN 2.7*   No results found for this basename: LIPASE, AMYLASE,  in the last 168 hours No results found for this basename: AMMONIA,  in the last 168 hours CBC:  Recent Labs Lab 07/02/14 2148 07/03/14 0426  WBC 13.1* 13.0*  NEUTROABS 11.3*  --   HGB 12.1* 12.0*  HCT 36.9* 36.6*  MCV 94.1 95.1  PLT 164 156   Cardiac Enzymes: No results found for this basename: CKTOTAL, CKMB, CKMBINDEX, TROPONINI,  in the last 168 hours BNP (last 3 results) No results found for this basename: PROBNP,  in the last 8760 hours CBG: No results found for this basename: GLUCAP,  in the last 168 hours  Recent Results (from the past 240 hour(s))  URINE CULTURE     Status: None   Collection Time    07/02/14 10:39 PM      Result Value Ref Range Status   Specimen Description URINE, SUPRAPUBIC   Final   Special Requests NONE   Final   Culture  Setup Time     Final   Value: 07/03/2014 01:38     Performed at [facility]     Final   Value: >=100,000  COLONIES/ML     Performed at [facility]     Final   Value: [place]     Performed at [facility]   Report Status PENDING   Incomplete  CULTURE, BLOOD (ROUTINE X 2)     Status: None   Collection Time    07/03/14 12:03 AM      Result Value Ref Range Status   Specimen Description BLOOD RIGHT WRIST   Final   Special Requests BOTTLES DRAWN AEROBIC AND ANAEROBIC 3CC   Final   Culture  Setup Time     Final   Value: 07/03/2014 03:43     Performed at [facility]    Culture     Final   Value:        BLOOD CULTURE RECEIVED NO GROWTH TO DATE CULTURE WILL BE HELD FOR 5 DAYS BEFORE ISSUING A FINAL NEGATIVE REPORT     Performed at [facility]   Report Status PENDING   Incomplete  CULTURE, BLOOD (ROUTINE X 2)     Status: None   Collection Time    07/03/14 12:49 AM      Result Value Ref Range Status   Specimen Description BLOOD RIGHT ANTECUBITAL   Final   Special Requests BOTTLES DRAWN AEROBIC AND ANAEROBIC 3CC   Final   Culture  Setup Time     Final   Value: 07/03/2014 03:43     Performed at [facility]   Culture     Final   Value:        BLOOD CULTURE RECEIVED NO GROWTH TO DATE CULTURE WILL BE HELD FOR 5 DAYS BEFORE ISSUING A FINAL NEGATIVE REPORT     Performed at [facility]   Report Status PENDING   Incomplete  MRSA PCR SCREENING     Status: None   Collection Time    07/03/14 10:50 AM      Result Value Ref Range Status   MRSA by PCR NEGATIVE  NEGATIVE Final   Comment:            The GeneXpert MRSA Assay (FDA     approved for NASAL specimens     only), is one component of a     comprehensive MRSA colonization     surveillance program. It is not     intended to diagnose MRSA     infection nor to guide or     monitor treatment for     MRSA infections.     Studies: Ct Abdomen Pelvis Wo Contrast  07/03/2014   CLINICAL DATA:  Check for hydronephrosis.  Urinary tract infection  EXAM: CT ABDOMEN AND PELVIS WITHOUT CONTRAST  TECHNIQUE: Multidetector CT imaging of the abdomen and pelvis was performed following the standard protocol without IV contrast.  COMPARISON:  04/08/2012  FINDINGS: LOWER CHEST: Bilateral gynecomastia.  Mild dependent atelectasis.  ABDOMEN/PELVIS:  Liver: Stable appearance of numerous liver cysts.  Biliary: Cholecystectomy.  No evidence of biliary obstruction.  Pancreas: Stable appearance of extensive coarse calcification within in the pancreatic body and tail, with atrophy. No evidence of active  inflammation or progression from 2013.  Spleen: Unremarkable.  Adrenals: Stable mild thickening of the inferior left gland.  Kidneys and ureters: Stable appearance of a indeterminate density exophytic lesion from the interpolar left kidney measuring 14 mm. This is a complex cyst based on previous MR imaging report. There may be new layering milk of calcium within the cyst.  There is a large cyst within the interpolar right kidney, herniating into the sinus, measuring 5.3 cm. No hydronephrosis or nephrolithiasis.  Bladder: Suprapubic catheter which is in good position. Evaluation of the bladder is limited by decompressed state and streak artifact from right hip prosthesis.  Reproductive: No acute findings  Bowel: No obstruction. Negative appendix.  Retroperitoneum: No mass or adenopathy.  Peritoneum: No ascites or pneumoperitoneum.  Vascular: 2.9 cm infrarenal abdominal aortic aneurysm which is unchanged from 2013. There is extensive atherosclerosis of the aorta and branch vessels.  OSSEOUS: Remote L1 compression fracture with 50-60% height loss. Compression deformities of the L2 superior endplate which is stable from 10/05/2013. New from 2014 is a L3 inferior plate fracture. No unhealed fracture line identified. There is chronic L4-5 anterolisthesis related to advanced facet osteoarthritis. Chronic L5-S1 disc calcification. Right hip hemiarthroplasty; no acute adverse features.  IMPRESSION: 1. No hydronephrosis or other acute intra-abdominal findings. 2. L3 inferior endplate fracture is new from 10/05/2013, but favored remote. 3. Chronic findings stable from 2013 are noted above.   Electronically Signed   By: Jorje Guild M.D.   On: 07/03/2014 02:45   Ct Head Wo Contrast  07/03/2014   CLINICAL DATA:  Confusion.  EXAM: CT HEAD WITHOUT CONTRAST  TECHNIQUE: Contiguous axial images were obtained from the base of the skull through the vertex without intravenous contrast.  COMPARISON:  Prior CT from 10/15/2011.   FINDINGS: Atrophy with advanced chronic microvascular ischemic disease is present.  There is no acute intracranial hemorrhage or infarct. No mass lesion or midline shift. Gray-white matter differentiation is well maintained. Ventricles are normal in size without evidence of hydrocephalus. CSF containing spaces are within normal limits. No extra-axial fluid collection.  The calvarium is intact.  Orbital soft tissues are within normal limits.  The paranasal sinuses and mastoid air cells are well pneumatized and free of fluid.  Scalp soft tissues are unremarkable.  IMPRESSION: 1. No acute intracranial abnormality. 2. Atrophy with advanced chronic microvascular ischemic disease.   Electronically Signed   By: Jeannine Boga M.D.   On: 07/03/2014 00:28   Dg Chest Port 1 View  07/03/2014   CLINICAL DATA:  Fever  EXAM: PORTABLE CHEST - 1 VIEW  COMPARISON:  September 18, 2013  FINDINGS: There is no edema or consolidation. The heart size is upper normal with pulmonary vascularity within normal limits. No adenopathy. No bone lesions. Mild mediastinal prominence is probably due to great vessel prominence in this age group.  IMPRESSION: No edema or consolidation.   Electronically Signed   By: Lowella Grip M.D.   On: 07/03/2014 02:01    Scheduled Meds: . cholecalciferol  4,000 Units Oral Daily  . docusate sodium  200 mg Oral BID  . enoxaparin (LOVENOX) injection  40 mg Subcutaneous Q24H  . folic acid-pyridoxine-cyancobalamin  1 tablet Oral Daily  . imipenem-cilastatin  250 mg Intravenous 4 times per day  . lactose free nutrition  237 mL Oral BID BM  . LORazepam  0.5 mg Oral Daily  . LORazepam  1 mg Oral QHS  . metoprolol tartrate  25 mg Oral BID  . mirtazapine  30 mg Oral QHS  . predniSONE  5 mg Oral Daily  . sertraline  50 mg Oral Daily  . triamcinolone cream  1 application Topical BID   Continuous Infusions: . sodium chloride 75 mL/hr at 07/04/14 1002    Principal Problem:   Acute  encephalopathy Active Problems:   Hypertension   Atrial fibrillation  UTI (lower urinary tract infection)   Bladder cancer   AKI (acute kidney injury)   Urinary retention    Time spent: 52 mins    Aleni Andrus M.D. [facility] Pager [phone]. If 7PM-7AM, please contact night-coverage at [email or web address], password [email or web address] 07/04/2014, 5:33 PM  LOS: 2 days

## 2014-07-04 NOTE — Evaluation (Signed)
Physical Therapy Evaluation Patient Details Name: Brent Day. MRN: [id number] DOB: 1927-08-26 Today's Date: 07/04/2014   History of Present Illness  80 yo male admitted 07/02/14 with fever, confusion, falls, c/w UTI, has suprapubic catheter, h/o prostate cancer/  Clinical Impression  Pt did mobilize  In room  And ambulate to BR. Per daughter, pt will return home with niece as daughter has planned to leave town soon. Daughter reports that "REHAB" was no a good experience. Pt will benefit from PT to address problems listed in note.  Follow Up Recommendations Home health PT;Supervision/Assistance - 24 hour    Equipment Recommendations  None recommended by PT    Recommendations for Other Services       Precautions / Restrictions Precautions Precautions: Fall Precaution Comments: supra pubic cath      Mobility  Bed Mobility Overal bed mobility: Needs Assistance;+ 2 for safety/equipment Bed Mobility: Supine to Sit;Sit to Supine     Supine to sit: Min assist Sit to supine: Min assist      Transfers Overall transfer level: Needs assistance Equipment used: Rolling walker (2 wheeled) Transfers: Sit to/from Stand Sit to Stand: Min assist;+2 safety/equipment         General transfer comment: extra time for mobilizing to the bed.  Ambulation/Gait Ambulation/Gait assistance: +2 safety/equipment;Min assist Ambulation Distance (Feet): 20 Feet (x2) Assistive device: Rolling walker (2 wheeled)       General Gait Details: pt did take some steps just holding onto rail  Stairs            Wheelchair Mobility    Modified Rankin (Stroke Patients Only)       Balance Overall balance assessment: Needs assistance;History of Falls         Standing balance support: Bilateral upper extremity supported;During functional activity Standing balance-Leahy Scale: Poor                               Pertinent Vitals/Pain sats 94 RA after mobility.     Home Living Family/patient expects to be discharged to:: Private residence Living Arrangements: Children Available Help at Discharge: Available 24 hours/day;Family Type of Home: House Home Access: Stairs to enter Entrance Stairs-Rails: None Entrance Stairs-Number of Steps: 1 Home Layout: One level Home Equipment: Environmental consultant - 2 wheels;Shower seat;Bedside commode Additional Comments: son, daughter and neice  take turns with having pt live with them. Plans to go to niece's as family has planned a vacation soon    Prior Function Level of Independence: Independent with assistive device(s)               Hand Dominance        Extremity/Trunk Assessment   Upper Extremity Assessment: Generalized weakness           Lower Extremity Assessment: Generalized weakness         Communication   Communication: No difficulties  Cognition Arousal/Alertness: Awake/alert Behavior During Therapy: WFL for tasks assessed/performed Overall Cognitive Status: Within Functional Limits for tasks assessed                      General Comments      Exercises        Assessment/Plan    PT Assessment Patient needs continued PT services  PT Diagnosis Difficulty walking;Generalized weakness   PT Problem List Decreased activity tolerance;Decreased balance;Decreased mobility;Decreased safety awareness;Decreased knowledge of use of DME;Decreased knowledge of precautions  PT Treatment  Interventions DME instruction;Gait training;Functional mobility training;Therapeutic activities;Therapeutic exercise;Patient/family education   PT Goals (Current goals can be found in the Care Plan section) Acute Rehab PT Goals Patient Stated Goal: per daughter, to go home with family, not rehab./ PT Goal Formulation: With patient/family Time For Goal Achievement: 07/18/14 Potential to Achieve Goals: Good    Frequency Min 3X/week   Barriers to discharge        Co-evaluation                End of Session Equipment Utilized During Treatment: Gait belt Activity Tolerance: Patient tolerated treatment well Patient left: in bed;with call bell/phone within reach;with bed alarm set;with family/visitor present Nurse Communication: Mobility status         Time: 0100-7121 PT Time Calculation (min): 27 min   Charges:   PT Evaluation $Initial PT Evaluation Tier I: 1 Procedure PT Treatments $Gait Training: 23-37 mins   PT G Codes:          Claretha Cooper 07/04/2014, 4:30 PM

## 2014-07-04 NOTE — Progress Notes (Signed)
RN received phone call from  Patient's son Brennan Litzinger III [phone]) regarding concerns about patient discharge.  Son states he does not feel that the patient will be safe to discharge back home anytime soon and the family has a vacation plan that they cannot cancel at this time and the caregiver they had arranged to care for the patient while they are gone is not comfortable with the level of care needed to take on the role. Son also states that they do not want to send patient to a rehab facility as there have been problems with their experience in the past.  Family requests assistance with discharge disposition for the patient.

## 2014-07-05 LAB — BASIC METABOLIC PANEL
Anion gap: 13 (ref 5–15)
BUN: 24 mg/dL — ABNORMAL HIGH (ref 6–23)
CALCIUM: 8.8 mg/dL (ref 8.4–10.5)
CO2: 20 mEq/L (ref 19–32)
Chloride: 107 mEq/L (ref 96–112)
Creatinine, Ser: 1.06 mg/dL (ref 0.50–1.35)
GFR calc Af Amer: 72 mL/min — ABNORMAL LOW (ref >90)
GFR, EST NON AFRICAN AMERICAN: 62 mL/min — AB (ref >90)
GLUCOSE: 100 mg/dL — AB (ref 70–99)
Potassium: 4.1 mEq/L (ref 3.7–5.3)
Sodium: 140 mEq/L (ref 137–147)

## 2014-07-05 LAB — URINE CULTURE: Colony Count: >=100000

## 2014-07-05 MED ORDER — CEFUROXIME AXETIL 500 MG PO TABS
500.0000 mg | ORAL_TABLET | Freq: Two times a day (BID) | ORAL | Status: DC
  Administered 2014-07-05 – 2014-07-06 (×4): 500 mg via ORAL
  Filled 2014-07-05 (×5): qty 1

## 2014-07-05 MED ORDER — CIPROFLOXACIN HCL 500 MG PO TABS
500.0000 mg | ORAL_TABLET | Freq: Two times a day (BID) | ORAL | Status: DC
  Administered 2014-07-05: 500 mg via ORAL
  Filled 2014-07-05 (×3): qty 1

## 2014-07-05 NOTE — Evaluation (Signed)
Occupational Therapy Evaluation Patient Details Name: Brent Day. MRN: [id number] DOB: [birth year] Today's Date: 07/05/2014    History of Present Illness 80 yo male admitted 07/02/14 with fever, confusion, falls, c/w UTI, has suprapubic catheter, h/o prostate cancer/   Clinical Impression   Pt presents to OT with decreased I with ADL activity due to problems listed below and will benefit from skilled OT to increase I with ADL activity and return to PLOF    Follow Up Recommendations  Home health OT;Supervision/Assistance - 24 hour    Equipment Recommendations  None recommended by OT       Precautions / Restrictions Precautions Precautions: Fall Precaution Comments: supra pubic cath Restrictions Weight Bearing Restrictions: No      Mobility Bed Mobility Overal bed mobility: Modified Independent Bed Mobility: Supine to Sit     Supine to sit: HOB elevated;Supervision     General bed mobility comments: verbal cues for technique  Transfers Overall transfer level: Needs assistance Equipment used: Rolling walker (2 wheeled) Transfers: Sit to/from Stand Sit to Stand: Mod assist         General transfer comment: cues for hand placement; min/guard for safety    Balance             Standing balance-Leahy Scale: Poor                              ADL Overall ADL's : Needs assistance/impaired             Lower Body Bathing: Moderate assistance;Sit to/from stand   Upper Body Dressing : Minimal assistance   Lower Body Dressing: Moderate assistance;Sit to/from stand   Toilet Transfer: Moderate assistance;Regular Toilet;Ambulation   Toileting- Clothing Manipulation and Hygiene: Moderate assistance;Sit to/from stand       Functional mobility during ADLs: Moderate assistance;Rolling walker       Vision       wears glasses                       Extremity/Trunk Assessment Upper Extremity Assessment Upper Extremity  Assessment: Generalized weakness           Communication Communication Communication: No difficulties   Cognition Arousal/Alertness: Awake/alert Behavior During Therapy: WFL for tasks assessed/performed Overall Cognitive Status: Within Functional Limits for tasks assessed                     General Comments   pt grunted during OT session but reported no pain            Home Living Family/patient expects to be discharged to:: Private residence Living Arrangements: Children Available Help at Discharge: Available 24 hours/day;Family Type of Home: House Home Access: Stairs to enter [employer] of Steps: 1 Entrance Stairs-Rails: None Home Layout: One level               Home Equipment: Environmental consultant - 2 wheels;Shower seat;Bedside commode   Additional Comments: son, daughter and neice  take turns with having pt live with them. Plans to go to niece's as family has planned a vacation soon      Prior Functioning/Environment Level of Independence: Independent with assistive device(s)             OT Diagnosis: Generalized weakness   OT Problem List: Decreased strength;Decreased activity tolerance   OT Treatment/Interventions: Self-care/ADL training;DME and/or AE instruction;Patient/family education    OT Goals(Current goals can  be found in the care plan section) Acute Rehab OT Goals Patient Stated Goal: per daughter, to go home with family, not rehab./ OT Goal Formulation: With patient Time For Goal Achievement: 07/19/14  OT Frequency: Min 2X/week              End of Session Nurse Communication: Mobility status  Activity Tolerance: Patient tolerated treatment well Patient left: in bed;with call bell/phone within reach   Time: 1135-1202 OT Time Calculation (min): 27 min Charges:  OT General Charges $OT Visit: 1 Procedure OT Evaluation $Initial OT Evaluation Tier I: 1 Procedure OT Treatments $Self Care/Home Management : 23-37  mins G-Codes:    Payton Mccallum D 2014/07/14, 12:25 PM

## 2014-07-05 NOTE — Progress Notes (Signed)
[facility] PROGRESS NOTE  Brent Day. HYI:[id number] DOB: January 26, 1927 DOA: 07/02/2014 PCP: Alesia Richards, MD  Assessment/Plan: #1 acute encephalopathy Metabolic in nature secondary to urinary tract infection. Patient with clinical improvement. Urine cultures with Klebsiella oxytoca. Change IV Primaxin to oral Ceftin.  #2 Klebsiella oxytoca urinary tract infection Urine cultures growing Klebsiella oxytoca. Sensitive to the quinolones and Rocephin and Bactrim. Will change IV Primaxin to oral Ceftin to complete course of antibiotic therapy.   #3 urinary retention status post suprapubic catheter Suprapubic catheter has been exchanged by urology. Appreciate urology input and recommendations.  #4 history of prostate cancer Outpatient followup with urology.  #5 acute renal insufficiency Secondary to prerenal azotemia. Renal function improving daily with hydration. Follow.  #6 atrial fibrillation Currently rate controlled on metoprolol. Patient is not an anticoagulation candidate secondary to falls.  #7 anemia H&H stable. Follow.  #8 prophylaxis Lovenox for DVT prophylaxis.  Tried calling patient's son, however only reached an answering machine.  Code Status: Full Family Communication: Updated patient. Tried to call patient's son however no answer Disposition Plan: Home when medically stable.   Consultants:  Neurology: Dr. Tresa Moore 07/03/2014  Procedures:  Exchange of suprapubic catheter per Dr. Tresa Moore 07/03/2014  CT abdomen and pelvis 07/03/2014  CT head 07/03/2014  Chest x-ray 07/03/2014  Antibiotics:  IV Primaxin 07/03/2014 >>>> 07/05/2014  Oral Ceftin 07/05/2014  HPI/Subjective: Patient with no complaints. Patient alert and following commands. Patient tolerating diet.   Objective: Filed Vitals:   07/05/14 1422  BP: 161/68  Pulse: 88  Temp: 98.3 F (36.8 C)  Resp: 18    Intake/Output Summary (Last 24 hours) at 07/05/14 1748 Last data  filed at 07/05/14 1429  Gross per 24 hour  Intake 2687.5 ml  Output   1300 ml  Net 1387.5 ml   Filed Weights   07/03/14 0246  Weight: 71.215 kg (157 lb)    Exam:   General:  NAD  Cardiovascular: RRR  Respiratory: CTAB anterior lung fields.  Abdomen: Soft, nontender, nondistended, positive bowel sounds  Musculoskeletal: No clubbing cyanosis or edema  Data Reviewed: Basic Metabolic Panel:  Recent Labs Lab 07/02/14 2148 07/03/14 0426 07/05/14 0424  NA 137 137 140  K 4.3 4.3 4.1  CL 103 103 107  CO2 20 20 20   GLUCOSE 146* 111* 100*  BUN 41* 36* 24*  CREATININE 1.57* 1.41* 1.06  CALCIUM 8.8 8.6 8.8   Liver Function Tests:  Recent Labs Lab 07/03/14 0426  AST 71*  ALT 25  ALKPHOS 54  BILITOT 0.4  PROT 6.4  ALBUMIN 2.7*   No results found for this basename: LIPASE, AMYLASE,  in the last 168 hours No results found for this basename: AMMONIA,  in the last 168 hours CBC:  Recent Labs Lab 07/02/14 2148 07/03/14 0426  WBC 13.1* 13.0*  NEUTROABS 11.3*  --   HGB 12.1* 12.0*  HCT 36.9* 36.6*  MCV 94.1 95.1  PLT 164 156   Cardiac Enzymes: No results found for this basename: CKTOTAL, CKMB, CKMBINDEX, TROPONINI,  in the last 168 hours BNP (last 3 results) No results found for this basename: PROBNP,  in the last 8760 hours CBG: No results found for this basename: GLUCAP,  in the last 168 hours  Recent Results (from the past 240 hour(s))  URINE CULTURE     Status: None   Collection Time    07/02/14 10:39 PM      Result Value Ref Range Status   Specimen Description URINE, SUPRAPUBIC  Final   Special Requests NONE   Final   Culture  Setup Time     Final   Value: 07/03/2014 01:38     Performed at [place]     Final   Value: >=100,000 COLONIES/ML     Performed at [facility]   Culture     Final   Value: KLEBSIELLA OXYTOCA     Performed at [facility]   Report Status 07/05/2014 FINAL   Final   Organism ID,  Bacteria KLEBSIELLA OXYTOCA   Final  CULTURE, BLOOD (ROUTINE X 2)     Status: None   Collection Time    07/03/14 12:03 AM      Result Value Ref Range Status   Specimen Description BLOOD RIGHT WRIST   Final   Special Requests BOTTLES DRAWN AEROBIC AND ANAEROBIC 3CC   Final   Culture  Setup Time     Final   Value: 07/03/2014 03:43     Performed at [facility]   Culture     Final   Value: GRAM NEGATIVE RODS     Note: Gram Stain Report Called to,Read Back By and Verified With: Kerry Fort 07/05/14 0139A [place]     Performed at [facility]   Report Status PENDING   Incomplete  CULTURE, BLOOD (ROUTINE X 2)     Status: None   Collection Time    07/03/14 12:49 AM      Result Value Ref Range Status   Specimen Description BLOOD RIGHT ANTECUBITAL   Final   Special Requests BOTTLES DRAWN AEROBIC AND ANAEROBIC 3CC   Final   Culture  Setup Time     Final   Value: 07/03/2014 03:43     Performed at [facility]   Culture     Final   Value:        BLOOD CULTURE RECEIVED NO GROWTH TO DATE CULTURE WILL BE HELD FOR 5 DAYS BEFORE ISSUING A FINAL NEGATIVE REPORT     Performed at [facility]   Report Status PENDING   Incomplete  MRSA PCR SCREENING     Status: None   Collection Time    07/03/14 10:50 AM      Result Value Ref Range Status   MRSA by PCR NEGATIVE  NEGATIVE Final   Comment:            The GeneXpert MRSA Assay (FDA     approved for NASAL specimens     only), is one component of a     comprehensive MRSA colonization     surveillance program. It is not     intended to diagnose MRSA     infection nor to guide or     monitor treatment for     MRSA infections.     Studies: No results found.  Scheduled Meds: . cefUROXime  500 mg Oral BID WC  . cholecalciferol  4,000 Units Oral Daily  . docusate sodium  200 mg Oral BID  . enoxaparin (LOVENOX) injection  40 mg Subcutaneous Q24H  . folic acid-pyridoxine-cyancobalamin  1 tablet Oral Daily  . lactose  free nutrition  237 mL Oral BID BM  . LORazepam  0.5 mg Oral Daily  . LORazepam  1 mg Oral QHS  . metoprolol tartrate  25 mg Oral BID  . mirtazapine  30 mg Oral QHS  . predniSONE  5 mg Oral Daily  . sertraline  50  mg Oral Daily  . triamcinolone cream  1 application Topical BID   Continuous Infusions: . sodium chloride 75 mL/hr at 07/05/14 1429    Principal Problem:   Acute encephalopathy Active Problems:   Hypertension   Atrial fibrillation   UTI (lower urinary tract infection)   Bladder cancer   AKI (acute kidney injury)   Urinary retention    Time spent: 29 mins    THOMPSON,DANIEL M.D. [facility] Pager [phone]. If 7PM-7AM, please contact night-coverage at [email or web address], password [facility] 07/05/2014, 5:48 PM  LOS: 3 days

## 2014-07-05 NOTE — Progress Notes (Signed)
Physical Therapy Treatment Patient Details Name: Brent Day. MRN: [id number] DOB: Jul 15, 1927 Today's Date: 07/05/2014    History of Present Illness 80 yo male admitted 07/02/14 with fever, confusion, falls, c/w UTI, has suprapubic catheter, h/o prostate cancer/    PT Comments    *Good progress with mobility today. Increased gait distance to 125' with RW, less assist for transfers and bed mobility. **  Follow Up Recommendations  Home health PT;Supervision/Assistance - 24 hour     Equipment Recommendations  None recommended by PT    Recommendations for Other Services       Precautions / Restrictions Precautions Precautions: Fall Precaution Comments: supra pubic cath Restrictions Weight Bearing Restrictions: No    Mobility  Bed Mobility Overal bed mobility: Modified Independent Bed Mobility: Supine to Sit     Supine to sit: HOB elevated;Supervision     General bed mobility comments: verbal cues for technique  Transfers Overall transfer level: Needs assistance Equipment used: Rolling walker (2 wheeled) Transfers: Sit to/from Stand Sit to Stand: From elevated surface;Min guard         General transfer comment: cues for hand placement; min/guard for safety  Ambulation/Gait Ambulation/Gait assistance: Min guard Ambulation Distance (Feet): 125 Feet Assistive device: Rolling walker (2 wheeled) Gait Pattern/deviations: Trunk flexed     General Gait Details: cues for positioning in RW, steady with RW   Stairs            Wheelchair Mobility    Modified Rankin (Stroke Patients Only)       Balance                                    Cognition Arousal/Alertness: Awake/alert Behavior During Therapy: WFL for tasks assessed/performed Overall Cognitive Status: Within Functional Limits for tasks assessed                      Exercises      General Comments        Pertinent Vitals/Pain **0/10 pain*    Home Living                       Prior Function            PT Goals (current goals can now be found in the care plan section) Acute Rehab PT Goals Patient Stated Goal: per daughter, to go home with family, not rehab./ PT Goal Formulation: With patient/family Time For Goal Achievement: 07/18/14 Potential to Achieve Goals: Good Progress towards PT goals: Progressing toward goals    Frequency  Min 3X/week    PT Plan Current plan remains appropriate    Co-evaluation             End of Session Equipment Utilized During Treatment: Gait belt Activity Tolerance: Patient tolerated treatment well Patient left: with call bell/phone within reach;in chair (RN to place chair alarm)     Time: 1016-1030 PT Time Calculation (min): 14 min  Charges:  $Gait Training: 8-22 mins                    G Codes:      Philomena Doheny 07/05/2014, 10:42 AM [phone]

## 2014-07-06 LAB — CBC
HCT: 37.3 % — ABNORMAL LOW (ref 39.0–52.0)
Hemoglobin: 12.2 g/dL — ABNORMAL LOW (ref 13.0–17.0)
MCH: 30.7 pg (ref 26.0–34.0)
MCHC: 32.7 g/dL (ref 30.0–36.0)
MCV: 93.7 fL (ref 78.0–100.0)
PLATELETS: 193 10*3/uL (ref 150–400)
RBC: 3.98 MIL/uL — ABNORMAL LOW (ref 4.22–5.81)
RDW: 14.2 % (ref 11.5–15.5)
WBC: 8.6 10*3/uL (ref 4.0–10.5)

## 2014-07-06 LAB — BASIC METABOLIC PANEL
ANION GAP: 11 (ref 5–15)
BUN: 19 mg/dL (ref 6–23)
CHLORIDE: 104 meq/L (ref 96–112)
CO2: 21 mEq/L (ref 19–32)
CREATININE: 1.02 mg/dL (ref 0.50–1.35)
Calcium: 9.3 mg/dL (ref 8.4–10.5)
GFR, EST AFRICAN AMERICAN: 75 mL/min — AB (ref >90)
GFR, EST NON AFRICAN AMERICAN: 65 mL/min — AB (ref >90)
Glucose, Bld: 108 mg/dL — ABNORMAL HIGH (ref 70–99)
Potassium: 4.1 mEq/L (ref 3.7–5.3)
Sodium: 136 mEq/L — ABNORMAL LOW (ref 137–147)

## 2014-07-06 MED ORDER — CEFUROXIME AXETIL 500 MG PO TABS: 500.0000 mg | ORAL_TABLET | Freq: Two times a day (BID) | ORAL | Status: AC

## 2014-07-06 NOTE — Progress Notes (Signed)
CSW received notification that pt needing ambulance transport home.  CSW contacted pt daughter, Jackelyn Poling to confirm address.  CSW arranged ambulance transport via [place].  Pt daughter, Jackelyn Poling request that pt granddaughter, [name] be notified when ambulance transport arrives to pick up pt. RN aware. Pt granddaughter, [name] contact information is [phone]. CSW notified pt granddaughter that ambulance transport had been requested.  No further social work needs identified at this time.  CSW signing off.   Alison Murray, MSW, [place] [facility] [phone]

## 2014-07-06 NOTE — Discharge Summary (Signed)
Physician Discharge Summary  Brent Day. UXN:[id number] DOB: 1927-03-13 DOA: 07/02/2014  PCP: Alesia Richards, MD  Admit date: 07/02/2014 Discharge date: 07/06/2014  Time spent: 65 minutes  Recommendations for Outpatient Follow-up:  1. Followup with Alesia Richards, MD in 1 week. On followup basic metabolic profile need to be obtained to followup on electrolytes and renal function.  Discharge Diagnoses:  Principal Problem:   Acute encephalopathy Active Problems:   Hypertension   Atrial fibrillation   UTI (lower urinary tract infection)   Bladder cancer   AKI (acute kidney injury)   Urinary retention   Discharge Condition: Stable and improved.  Diet recommendation: Regular  Filed Weights   07/03/14 0246  Weight: 71.215 kg (157 lb)    History of present illness:  Brent Day. is a 80 y.o. male with history of bladder cancer and prostate cancer on suprapubic catheter, atrial fibrillation not on anticoagulation secondary to falls was brought to the ER after patient was found to be having increasing confusion and hallucinations with fever over the last 2 days. Patient's daughter also states that patient had one episode of nausea vomiting. Over the last one week patient's daughter noticed that patient's urine was getting increasingly cloudy. In the ER patient was found to have UA compatible with UTI. Patient has chronic indwelling suprapubic catheter secondary to outlet obstruction. On exam patient is obese alert and awake and follows commands. CT head did not show anything acute. Urine cultures were obtained and patient has been started on antibiotics for UTI. On exam patient is complaining of right flank and upper quadrant pain. CT abdomen and pelvis is pending   Hospital Course:  #1 acute encephalopathy  Metabolic in nature secondary to urinary tract infection. Urinalysis which was done on admission was consistent with a UTI. CT of the head which was done  on admission was negative for any acute abnormalities. Patient was placed empirically on IV Primaxin and followed. Patient improved clinically on a daily basis and was close to his baseline by day of discharge. Urine cultures came back with Klebsiella oxytoca. IV Primaxin was subsequently changed to oral Ceftin which patient tolerated. Patient did discharge him on 5 more days of oral Ceftin to complete a one-week course of antibiotic therapy. Patient had improved and was close to baseline by day of discharge.  #2 Klebsiella oxytoca urinary tract infection  On admission patient's urinalysis was consistent with a UTI. It was felt this was the etiology of patient's altered mental status. Patient was initially placed empirically on IV Primaxin and monitored. Urine cultures grew Klebsiella oxytoca. Sensitive to the quinolones and Rocephin and Bactrim. Patient was subsequently transitioned to oral Ceftin which he tolerated. Patient be discharged on 5 more days of oral Ceftin to complete a one-week course of antibiotic therapy. Patient was discharged in stable and improved condition.  #3 urinary retention status post suprapubic catheter  Patient with a history of urinary retention now dependent on a suprapubic catheter which was changed monthly. Patient had failed voiding trial after TURP. Patient was seen in the hospitalization by Dr. Tresa Moore urology who exchanged patient's Suprapubic catheter. Patient will followup in the urology office as outpatient. #4 history of prostate cancer  Outpatient followup with urology.  #5 acute renal insufficiency  Secondary to prerenal azotemia. Patient was hydrated with IVF with improvement in renal function. #6 atrial fibrillation  Rate controlled on metoprolol. Patient is not an anticoagulation candidate secondary to falls.  #7 anemia  Patient's hemoglobin remained  stable throughout the hospitalization. Outpatient follow up.     Procedures: Exchange of suprapubic  catheter per Dr. Tresa Moore 07/03/2014  CT abdomen and pelvis 07/03/2014  CT head 07/03/2014  Chest x-ray 07/03/2014   Consultations: Neurology: Dr. Tresa Moore 07/03/2014     Discharge Exam: Filed Vitals:   07/06/14 0541  BP: 160/64  Pulse: 80  Temp: 98.2 F (36.8 C)  Resp: 16    General: NAD Cardiovascular: RRR Respiratory: CTAB  Discharge Instructions You were cared for by a hospitalist during your hospital stay. If you have any questions about your discharge medications or the care you received while you were in the hospital after you are discharged, you can call the unit and asked to speak with the hospitalist on call if the hospitalist that took care of you is not available. Once you are discharged, your primary care physician will handle any further medical issues. Please note that NO REFILLS for any discharge medications will be authorized once you are discharged, as it is imperative that you return to your primary care physician (or establish a relationship with a primary care physician if you do not have one) for your aftercare needs so that they can reassess your need for medications and monitor your lab values.      Discharge Instructions   Diet general    Complete by:  As directed      Discharge instructions    Complete by:  As directed   Follow up with MCKEOWN,Jarelle DAVID, MD in 1 week.     Increase activity slowly    Complete by:  As directed             Medication List         b complex vitamins tablet  Take 1 tablet by mouth daily.     cefUROXime 500 MG tablet  Commonly known as:  CEFTIN  Take 1 tablet (500 mg total) by mouth 2 (two) times daily with a meal. Take for 5 days then stop.     COLACE 100 MG capsule  Generic drug:  docusate sodium  Take 200 mg by mouth 2 (two) times daily.     ENSURE PLUS Liqd  Take 237 mLs by mouth 2 (two) times daily between meals.     LORazepam 0.5 MG tablet  Commonly known as:  ATIVAN  Take 0.5-1 mg by mouth 2 (two)  times daily. 0.5mg  in the morning and 1mg  at bedtime     metoprolol tartrate 25 MG tablet  Commonly known as:  LOPRESSOR  Take 1 tablet (25 mg total) by mouth 2 (two) times daily.     mirtazapine 30 MG tablet  Commonly known as:  REMERON  Take 30 mg by mouth at bedtime.     oxybutynin 5 MG tablet  Commonly known as:  DITROPAN  Take 5 mg by mouth daily as needed for bladder spasms.     predniSONE 10 MG tablet  Commonly known as:  DELTASONE  Take 5 mg by mouth daily.     PROBIOTIC DAILY PO  Take 1 capsule by mouth daily.     sertraline 50 MG tablet  Commonly known as:  ZOLOFT  Take 50 mg by mouth daily.     triamcinolone cream 0.1 %  Commonly known as:  KENALOG  Apply 1 application topically 2 (two) times daily.     Vitamin D3 2000 UNITS capsule  Take 4,000 Units by mouth daily.       No Known  Allergies Follow-up Information   Follow up with [facility]. (Home health nurse, physical and occupational therapy, social worker, aide)    Contact information:   [address] [place], [place] [address] [phone]      Follow up with Alesia Richards, MD. Schedule an appointment as soon as possible for a visit in 1 week.   Specialty:  Internal Medicine   Contact information:   [address] [place] [address] [phone]        The results of significant diagnostics from this hospitalization (including imaging, microbiology, ancillary and laboratory) are listed below for reference.    Significant Diagnostic Studies: Ct Abdomen Pelvis Wo Contrast  07/03/2014   CLINICAL DATA:  Check for hydronephrosis.  Urinary tract infection  EXAM: CT ABDOMEN AND PELVIS WITHOUT CONTRAST  TECHNIQUE: Multidetector CT imaging of the abdomen and pelvis was performed following the standard protocol without IV contrast.  COMPARISON:  04/08/2012  FINDINGS: LOWER CHEST: Bilateral gynecomastia.  Mild dependent atelectasis.  ABDOMEN/PELVIS:  Liver: Stable  appearance of numerous liver cysts.  Biliary: Cholecystectomy.  No evidence of biliary obstruction.  Pancreas: Stable appearance of extensive coarse calcification within in the pancreatic body and tail, with atrophy. No evidence of active inflammation or progression from 2013.  Spleen: Unremarkable.  Adrenals: Stable mild thickening of the inferior left gland.  Kidneys and ureters: Stable appearance of a indeterminate density exophytic lesion from the interpolar left kidney measuring 14 mm. This is a complex cyst based on previous MR imaging report. There may be new layering milk of calcium within the cyst. There is a large cyst within the interpolar right kidney, herniating into the sinus, measuring 5.3 cm. No hydronephrosis or nephrolithiasis.  Bladder: Suprapubic catheter which is in good position. Evaluation of the bladder is limited by decompressed state and streak artifact from right hip prosthesis.  Reproductive: No acute findings  Bowel: No obstruction. Negative appendix.  Retroperitoneum: No mass or adenopathy.  Peritoneum: No ascites or pneumoperitoneum.  Vascular: 2.9 cm infrarenal abdominal aortic aneurysm which is unchanged from 2013. There is extensive atherosclerosis of the aorta and branch vessels.  OSSEOUS: Remote L1 compression fracture with 50-60% height loss. Compression deformities of the L2 superior endplate which is stable from 10/05/2013. New from 2014 is a L3 inferior plate fracture. No unhealed fracture line identified. There is chronic L4-5 anterolisthesis related to advanced facet osteoarthritis. Chronic L5-S1 disc calcification. Right hip hemiarthroplasty; no acute adverse features.  IMPRESSION: 1. No hydronephrosis or other acute intra-abdominal findings. 2. L3 inferior endplate fracture is new from 10/05/2013, but favored remote. 3. Chronic findings stable from 2013 are noted above.   Electronically Signed   By: Jorje Guild M.D.   On: 07/03/2014 02:45   Ct Head Wo  Contrast  07/03/2014   CLINICAL DATA:  Confusion.  EXAM: CT HEAD WITHOUT CONTRAST  TECHNIQUE: Contiguous axial images were obtained from the base of the skull through the vertex without intravenous contrast.  COMPARISON:  Prior CT from 10/15/2011.  FINDINGS: Atrophy with advanced chronic microvascular ischemic disease is present.  There is no acute intracranial hemorrhage or infarct. No mass lesion or midline shift. Gray-white matter differentiation is well maintained. Ventricles are normal in size without evidence of hydrocephalus. CSF containing spaces are within normal limits. No extra-axial fluid collection.  The calvarium is intact.  Orbital soft tissues are within normal limits.  The paranasal sinuses and mastoid air cells are well pneumatized and free of fluid.  Scalp soft tissues  are unremarkable.  IMPRESSION: 1. No acute intracranial abnormality. 2. Atrophy with advanced chronic microvascular ischemic disease.   Electronically Signed   By: Jeannine Boga M.D.   On: 07/03/2014 00:28   Dg Chest Port 1 View  07/03/2014   CLINICAL DATA:  Fever  EXAM: PORTABLE CHEST - 1 VIEW  COMPARISON:  September 18, 2013  FINDINGS: There is no edema or consolidation. The heart size is upper normal with pulmonary vascularity within normal limits. No adenopathy. No bone lesions. Mild mediastinal prominence is probably due to great vessel prominence in this age group.  IMPRESSION: No edema or consolidation.   Electronically Signed   By: Lowella Grip M.D.   On: 07/03/2014 02:01    Microbiology: Recent Results (from the past 240 hour(s))  URINE CULTURE     Status: None   Collection Time    07/02/14 10:39 PM      Result Value Ref Range Status   Specimen Description URINE, SUPRAPUBIC   Final   Special Requests NONE   Final   Culture  Setup Time     Final   Value: 07/03/2014 01:38     Performed at [facility]     Final   Value: >=100,000 COLONIES/ML     Performed at [facility]   Culture     Final   Value: KLEBSIELLA OXYTOCA     Performed at [facility]   Report Status 07/05/2014 FINAL   Final   Organism ID, Bacteria KLEBSIELLA OXYTOCA   Final  CULTURE, BLOOD (ROUTINE X 2)     Status: None   Collection Time    07/03/14 12:03 AM      Result Value Ref Range Status   Specimen Description BLOOD RIGHT WRIST   Final   Special Requests BOTTLES DRAWN AEROBIC AND ANAEROBIC 3CC   Final   Culture  Setup Time     Final   Value: 07/03/2014 03:43     Performed at [facility]   Culture     Final   Value: GRAM NEGATIVE RODS     Note: Gram Stain Report Called to,Read Back By and Verified With: Kerry Fort 07/05/14 0139A [place]     Performed at [facility]   Report Status PENDING   Incomplete  CULTURE, BLOOD (ROUTINE X 2)     Status: None   Collection Time    07/03/14 12:49 AM      Result Value Ref Range Status   Specimen Description BLOOD RIGHT ANTECUBITAL   Final   Special Requests BOTTLES DRAWN AEROBIC AND ANAEROBIC 3CC   Final   Culture  Setup Time     Final   Value: 07/03/2014 03:43     Performed at [facility]   Culture     Final   Value:        BLOOD CULTURE RECEIVED NO GROWTH TO DATE CULTURE WILL BE HELD FOR 5 DAYS BEFORE ISSUING A FINAL NEGATIVE REPORT     Performed at [facility]   Report Status PENDING   Incomplete  MRSA PCR SCREENING     Status: None   Collection Time    07/03/14 10:50 AM      Result Value Ref Range Status   MRSA by PCR NEGATIVE  NEGATIVE Final   Comment:            The GeneXpert MRSA Assay (FDA     approved for NASAL  specimens     only), is one component of a     comprehensive MRSA colonization     surveillance program. It is not     intended to diagnose MRSA     infection nor to guide or     monitor treatment for     MRSA infections.     Labs: Basic Metabolic Panel:  Recent Labs Lab 07/02/14 2148 07/03/14 0426 07/05/14 0424 07/06/14 0405  NA 137 137 140 136*  K 4.3  4.3 4.1 4.1  CL 103 103 107 104  CO2 20 20 20 21   GLUCOSE 146* 111* 100* 108*  BUN 41* 36* 24* 19  CREATININE 1.57* 1.41* 1.06 1.02  CALCIUM 8.8 8.6 8.8 9.3   Liver Function Tests:  Recent Labs Lab 07/03/14 0426  AST 71*  ALT 25  ALKPHOS 54  BILITOT 0.4  PROT 6.4  ALBUMIN 2.7*   No results found for this basename: LIPASE, AMYLASE,  in the last 168 hours No results found for this basename: AMMONIA,  in the last 168 hours CBC:  Recent Labs Lab 07/02/14 2148 07/03/14 0426 07/06/14 0405  WBC 13.1* 13.0* 8.6  NEUTROABS 11.3*  --   --   HGB 12.1* 12.0* 12.2*  HCT 36.9* 36.6* 37.3*  MCV 94.1 95.1 93.7  PLT 164 156 193   Cardiac Enzymes: No results found for this basename: CKTOTAL, CKMB, CKMBINDEX, TROPONINI,  in the last 168 hours BNP: BNP (last 3 results) No results found for this basename: PROBNP,  in the last 8760 hours CBG: No results found for this basename: GLUCAP,  in the last 168 hours     Signed:  [facility] MD [facility] 07/06/2014, 2:47 PM

## 2014-07-06 NOTE — Progress Notes (Signed)
Patient was stable at time of discharge. Reviewed discharge education with patient's niece [name]. Grandville Silos MD had called in his prescription for patient. I removed patient's IV. Patient left with his belongings.

## 2014-07-07 LAB — CULTURE, BLOOD (ROUTINE X 2)

## 2014-07-09 LAB — CULTURE, BLOOD (ROUTINE X 2): Culture: NO GROWTH

## 2014-07-11 IMAGING — CR DG ANKLE COMPLETE 3+V*R*
3 series · 3 of 3 positions shown · non-contrast
Comparison: None.

CLINICAL DATA: Pain and erythema following injury.

RIGHT ANKLE - COMPLETE 3+ VIEW

[view not recorded (1 of 3)]
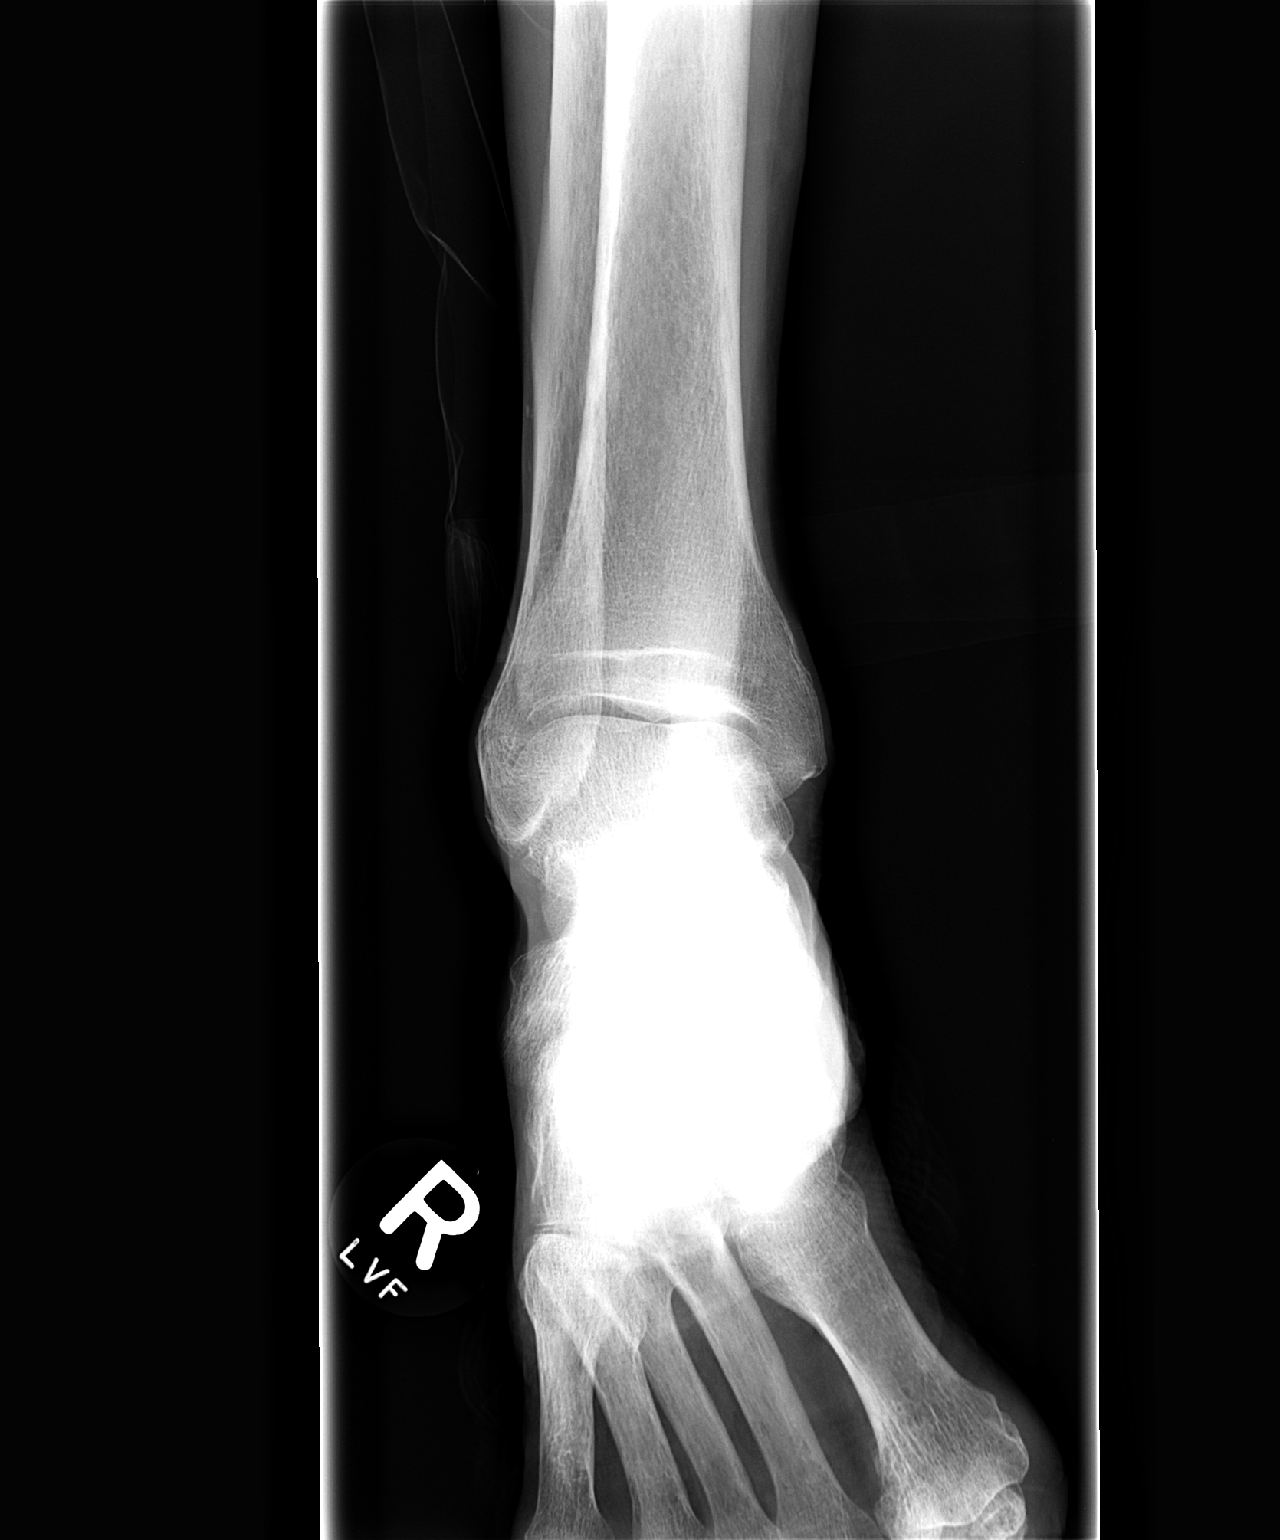

[view not recorded (2 of 3)]
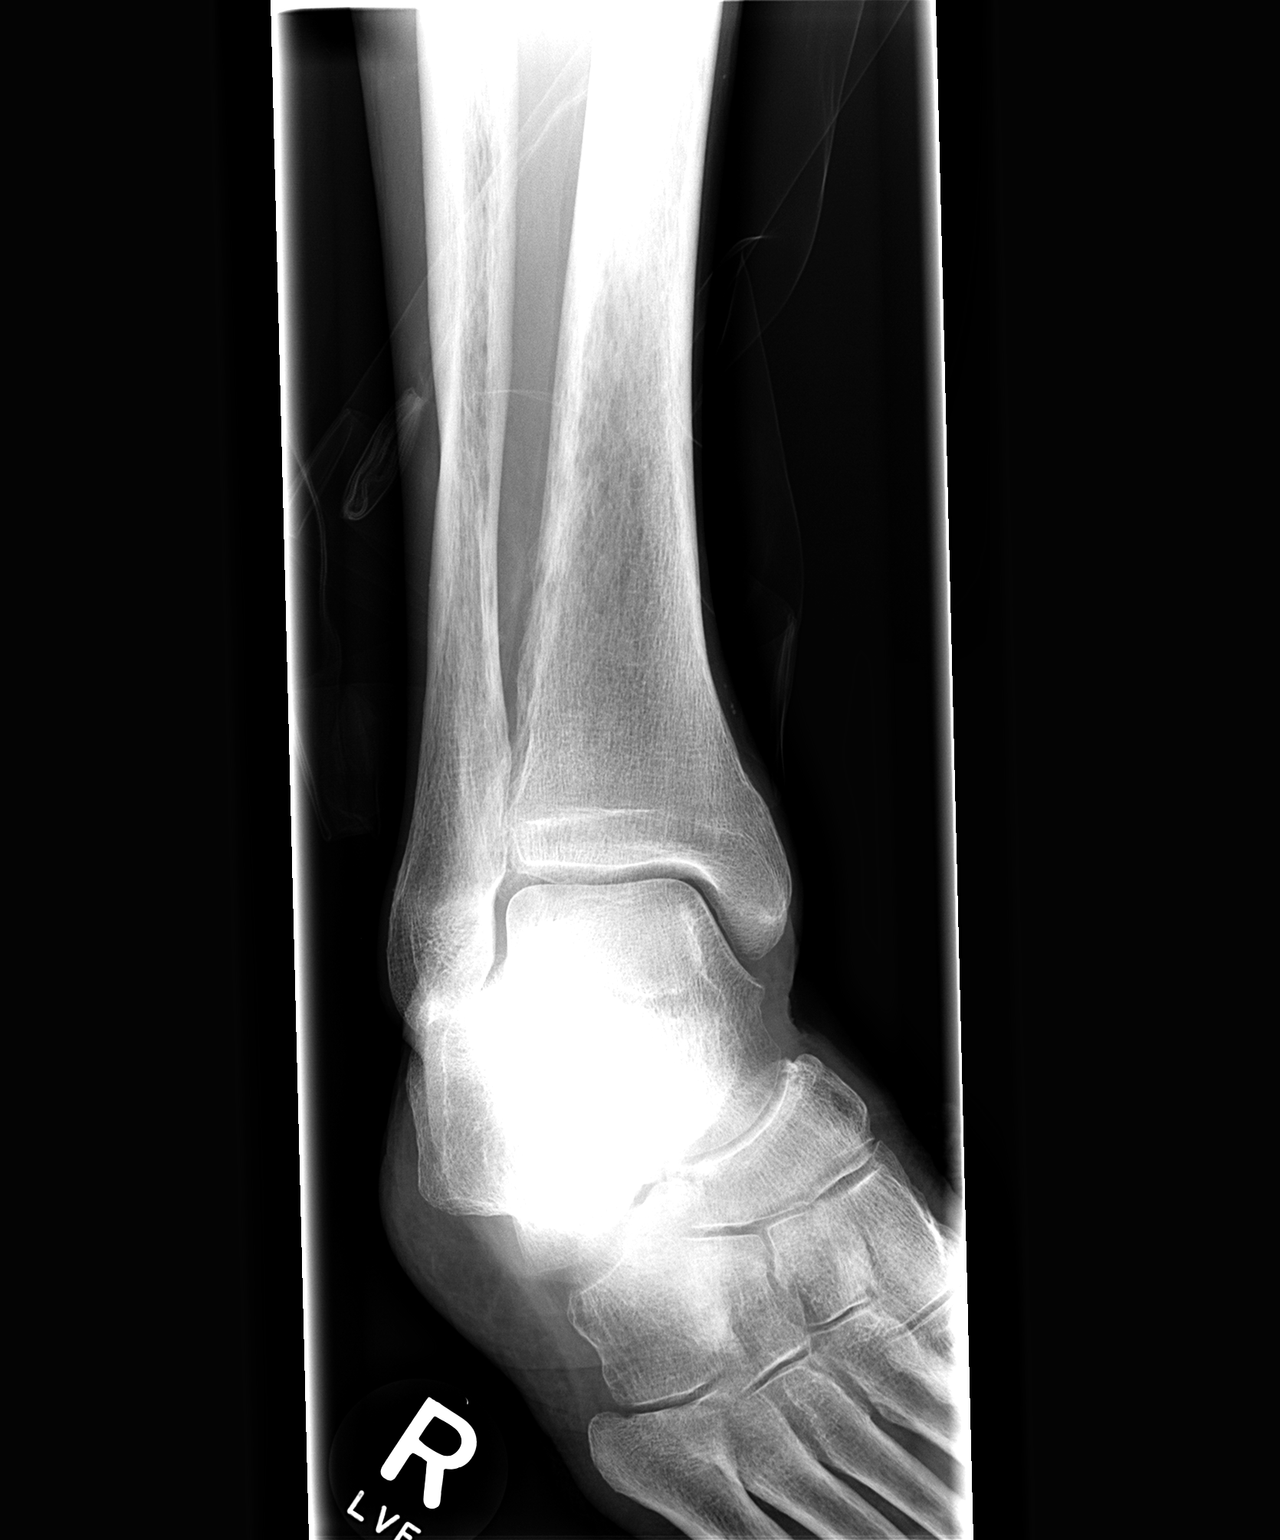

[view not recorded (3 of 3)]
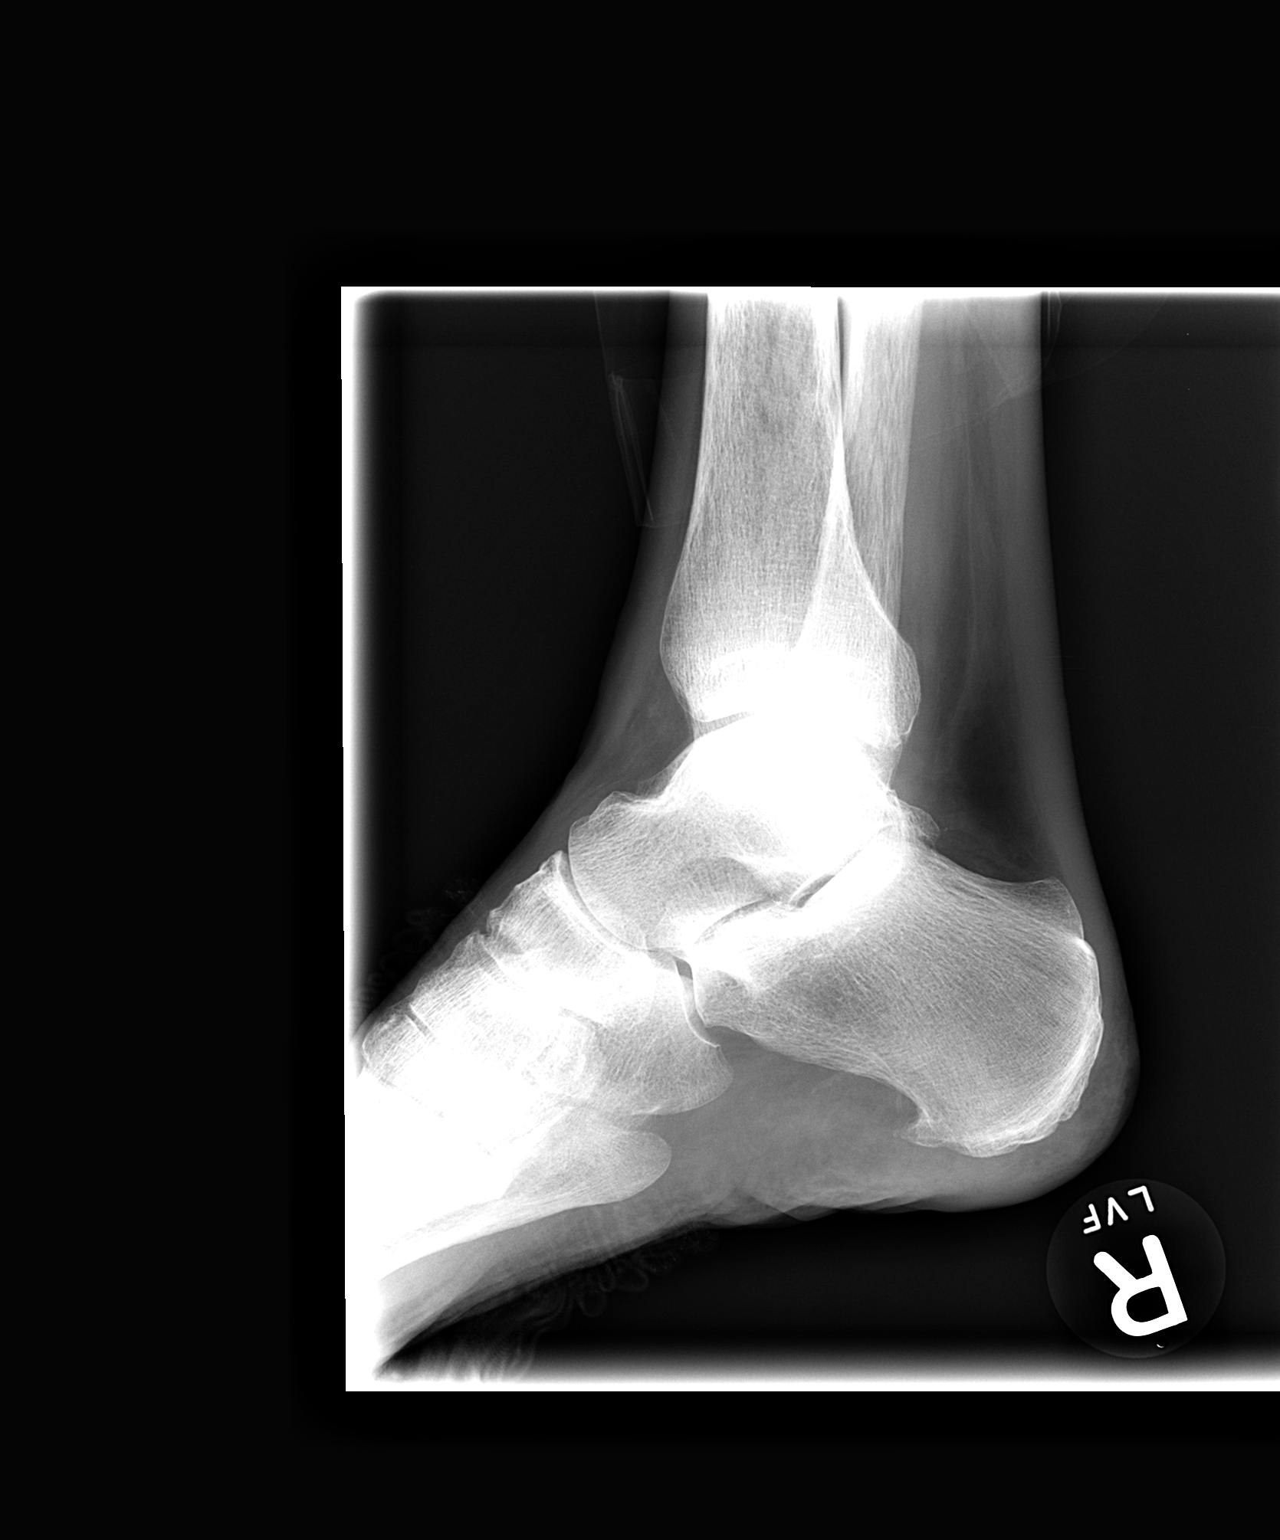

[3 of 3 positions shown; findings below may reference images not displayed]

FINDINGS: The bones appear mildly demineralized.  There is no
evidence of acute fracture, dislocation or bone destruction.  There
is a small plantar calcaneal spur.  No focal soft tissue
abnormalities are identified.
IMPRESSION: No acute osseous findings or evidence of osteomyelitis.  Mild
osseous demineralization.

## 2014-07-19 ENCOUNTER — Encounter: Payer: Self-pay | Admitting: Physician Assistant

## 2014-07-19 ENCOUNTER — Ambulatory Visit (INDEPENDENT_AMBULATORY_CARE_PROVIDER_SITE_OTHER): Payer: Medicare Other | Admitting: Physician Assistant

## 2014-07-19 VITALS — BP 122/62 | HR 72 | Temp 98.1°F | Resp 16 | Wt 155.0 lb

## 2014-07-19 DIAGNOSIS — Z79899 Other long term (current) drug therapy: Secondary | ICD-10-CM

## 2014-07-19 DIAGNOSIS — E860 Dehydration: Secondary | ICD-10-CM

## 2014-07-19 DIAGNOSIS — N3000 Acute cystitis without hematuria: Secondary | ICD-10-CM

## 2014-07-19 LAB — CBC WITH DIFFERENTIAL/PLATELET
BASOS ABS: 0.1 10*3/uL (ref 0.0–0.1)
BASOS PCT: 1 % (ref 0–1)
EOS ABS: 0.3 10*3/uL (ref 0.0–0.7)
Eosinophils Relative: 2 % (ref 0–5)
HCT: 40.4 % (ref 39.0–52.0)
Hemoglobin: 13.8 g/dL (ref 13.0–17.0)
Lymphocytes Relative: 12 % (ref 12–46)
Lymphs Abs: 1.5 10*3/uL (ref 0.7–4.0)
MCH: 30.9 pg (ref 26.0–34.0)
MCHC: 34.2 g/dL (ref 30.0–36.0)
MCV: 90.6 fL (ref 78.0–100.0)
Monocytes Absolute: 1 10*3/uL (ref 0.1–1.0)
Monocytes Relative: 8 % (ref 3–12)
NEUTROS ABS: 9.8 10*3/uL — AB (ref 1.7–7.7)
Neutrophils Relative %: 77 % (ref 43–77)
PLATELETS: 399 10*3/uL (ref 150–400)
RBC: 4.46 MIL/uL (ref 4.22–5.81)
RDW: 14.5 % (ref 11.5–15.5)
WBC: 12.7 10*3/uL — ABNORMAL HIGH (ref 4.0–10.5)

## 2014-07-19 LAB — COMPREHENSIVE METABOLIC PANEL
ALBUMIN: 3.6 g/dL (ref 3.5–5.2)
ALK PHOS: 135 U/L — AB (ref 39–117)
ALT: 22 U/L (ref 0–53)
AST: 24 U/L (ref 0–37)
BILIRUBIN TOTAL: 0.5 mg/dL (ref 0.2–1.2)
BUN: 24 mg/dL — ABNORMAL HIGH (ref 6–23)
CO2: 30 mEq/L (ref 19–32)
Calcium: 9.1 mg/dL (ref 8.4–10.5)
Chloride: 105 mEq/L (ref 96–112)
Creat: 1.52 mg/dL — ABNORMAL HIGH (ref 0.50–1.35)
Glucose, Bld: 89 mg/dL (ref 70–99)
POTASSIUM: 4.9 meq/L (ref 3.5–5.3)
SODIUM: 141 meq/L (ref 135–145)
TOTAL PROTEIN: 7.5 g/dL (ref 6.0–8.3)

## 2014-07-19 LAB — MAGNESIUM: MAGNESIUM: 2.1 mg/dL (ref 1.5–2.5)

## 2014-07-19 MED ORDER — NYSTATIN 100000 UNIT/ML MT SUSP: OROMUCOSAL | Status: DC

## 2014-07-19 NOTE — Patient Instructions (Addendum)
stop metoprolol, decrease zoloft to 50mg  QD.  Fatigue Fatigue is a feeling of tiredness, lack of energy, lack of motivation, or feeling tired all the time. Having enough rest, good nutrition, and reducing stress will normally reduce fatigue. Consult your caregiver if it persists. The nature of your fatigue will help your caregiver to find out its cause. The treatment is based on the cause.  CAUSES  There are many causes for fatigue. Most of the time, fatigue can be traced to one or more of your habits or routines. Most causes fit into one or more of three general areas. They are: Lifestyle problems  Sleep disturbances.  Overwork.  Physical exertion.  Unhealthy habits.  Poor eating habits or eating disorders.  Alcohol and/or drug use .  Lack of proper nutrition (malnutrition). Psychological problems  Stress and/or anxiety problems.  Depression.  Grief.  Boredom. Medical Problems or Conditions  Anemia.  Pregnancy.  Thyroid gland problems.  Recovery from major surgery.  Continuous pain.  Emphysema or asthma that is not well controlled  Allergic conditions.  Diabetes.  Infections (such as mononucleosis).  Obesity.  Sleep disorders, such as sleep apnea.  Heart failure or other heart-related problems.  Cancer.  Kidney disease.  Liver disease.  Effects of certain medicines such as antihistamines, cough and cold remedies, prescription pain medicines, heart and blood pressure medicines, drugs used for treatment of cancer, and some antidepressants. SYMPTOMS  The symptoms of fatigue include:   Lack of energy.  Lack of drive (motivation).  Drowsiness.  Feeling of indifference to the surroundings. DIAGNOSIS  The details of how you feel help guide your caregiver in finding out what is causing the fatigue. You will be asked about your present and past health condition. It is important to review all medicines that you take, including prescription and  non-prescription items. A thorough exam will be done. You will be questioned about your feelings, habits, and normal lifestyle. Your caregiver may suggest blood tests, urine tests, or other tests to look for common medical causes of fatigue.  TREATMENT  Fatigue is treated by correcting the underlying cause. For example, if you have continuous pain or depression, treating these causes will improve how you feel. Similarly, adjusting the dose of certain medicines will help in reducing fatigue.  HOME CARE INSTRUCTIONS   Try to get the required amount of good sleep every night.  Eat a healthy and nutritious diet, and drink enough water throughout the day.  Practice ways of relaxing (including yoga or meditation).  Exercise regularly.  Make plans to change situations that cause stress. Act on those plans so that stresses decrease over time. Keep your work and personal routine reasonable.  Avoid street drugs and minimize use of alcohol.  Start taking a daily multivitamin after consulting your caregiver. SEEK MEDICAL CARE IF:   You have persistent tiredness, which cannot be accounted for.  You have fever.  You have unintentional weight loss.  You have headaches.  You have disturbed sleep throughout the night.  You are feeling sad.  You have constipation.  You have dry skin.  You have gained weight.  You are taking any new or different medicines that you suspect are causing fatigue.  You are unable to sleep at night.  You develop any unusual swelling of your legs or other parts of your body. SEEK IMMEDIATE MEDICAL CARE IF:   You are feeling confused.  Your vision is blurred.  You feel faint or pass out.  You develop severe  headache.  You develop severe abdominal, pelvic, or back pain.  You develop chest pain, shortness of breath, or an irregular or fast heartbeat.  You are unable to pass a normal amount of urine.  You develop abnormal bleeding such as bleeding from  the rectum or you vomit blood.  You have thoughts about harming yourself or committing suicide.  You are worried that you might harm someone else. MAKE SURE YOU:   Understand these instructions.  Will watch your condition.  Will get help right away if you are not doing well or get worse. Document Released: 10/04/2007 Document Revised: 02/29/2012 Document Reviewed: 04/10/2014 [facility] Patient Information 2015 [place], [place]. This information is not intended to replace advice given to you by your health care provider. Make sure you discuss any questions you have with your health care provider.

## 2014-07-19 NOTE — Progress Notes (Signed)
   Subjective:    Patient ID: Brent Day., male    DOB: 27-Oct-1927, 80 y.o.   MRN: [id number]  HPI 80 y.o. male with history of bladder cancer and suprapubic catheter presents for ER follow up. He was admitted for AMS due to UTI/dehdration on 07/02/2014 and discharged on 07/06/2014. He states that he is feeling better but his main complaint is fatigue/weakness.   Lab Results  Component Value Date   CREATININE 1.02 07/06/2014   BUN 19 07/06/2014   NA 136* 07/06/2014   K 4.1 07/06/2014   CL 104 07/06/2014   CO2 21 07/06/2014    Blood pressure 122/62, pulse 72, temperature 98.1 F (36.7 C), resp. rate 16, weight 155 lb (70.308 kg).  Review of Systems  Constitutional: Positive for fatigue. Negative for fever, chills, diaphoresis, appetite change and unexpected weight change.  HENT: Negative.   Respiratory: Negative.   Cardiovascular: Negative.   Gastrointestinal: Negative.   Genitourinary: Negative.   Musculoskeletal: Negative.   Neurological: Negative.        Objective:   Physical Exam HEENT: normocephalic, sclerae anicteric, TMs pearly, nares patent, with white patches on posterior pharynx and erythematous tongue.  Oral cavity: MMM, no lesions  Neck: supple, no lymphadenopathy, no thyromegaly, no masses  Heart: RRR, normal S1, S2  Lungs: CTA bilaterally, no wheezes, rhonchi, or rales  Abdomen: +bs, soft, non tender, non distended, with suprapubic catheter with slight serosanguinous discharge seen on bandage, no warmth Musculoskeletal: nontender, no swelling, no obvious deformity, walks with walker Extremities: no edema, no cyanosis, no clubbing  Pulses: 2+ symmetric, upper and lower extremities, normal cap refill  Neurological: alert, oriented x 3, CN2-12 intact, 4/5 upper extremities and lower extremities, throughout, walks with a walker Psychiatric: normal affect, very pleasant  Skin: on his left nostril and right temple 2 nodules scaly and erythematous, some scaly  diffuse patches behind both ears and along neck line.      Assessment & Plan:  UTI- recheck urine, CBC, BMP Weakness- stop metoprolol, decrease zoloft to 50mg  QD.  Yeast infection- nystatin mouth wash

## 2014-07-20 LAB — URINALYSIS, ROUTINE W REFLEX MICROSCOPIC
Glucose, UA: NEGATIVE mg/dL
Nitrite: NEGATIVE
PH: 6 (ref 5.0–8.0)
Protein, ur: 100 mg/dL — AB
SPECIFIC GRAVITY, URINE: 1.023 (ref 1.005–1.030)
Urobilinogen, UA: 1 mg/dL (ref 0.0–1.0)

## 2014-07-20 LAB — URINALYSIS, MICROSCOPIC ONLY
CRYSTALS: NONE SEEN
Casts: NONE SEEN
WBC, UA: >50 WBC/hpf — AB (ref <3)

## 2014-07-22 LAB — URINE CULTURE: Colony Count: >=100000

## 2014-07-23 MED ORDER — AMOXICILLIN 500 MG PO CAPS: 500.0000 mg | ORAL_CAPSULE | Freq: Three times a day (TID) | ORAL | Status: DC

## 2014-07-23 NOTE — Addendum Note (Signed)
Addended by: Vicie Mutters R on: 07/23/2014 08:11 AM   Modules accepted: Orders

## 2014-07-31 ENCOUNTER — Other Ambulatory Visit: Payer: Self-pay | Admitting: Physician Assistant

## 2014-07-31 MED ORDER — LORAZEPAM 0.5 MG PO TABS: 0.5000 mg | ORAL_TABLET | Freq: Two times a day (BID) | ORAL | Status: DC

## 2014-08-07 ENCOUNTER — Other Ambulatory Visit: Payer: Self-pay | Admitting: Internal Medicine

## 2014-08-16 ENCOUNTER — Other Ambulatory Visit: Payer: Self-pay | Admitting: Dermatology

## 2014-08-23 ENCOUNTER — Other Ambulatory Visit: Payer: Self-pay | Admitting: Physician Assistant

## 2014-08-23 MED ORDER — TRIAMCINOLONE ACETONIDE 0.1 % EX CREA: 1.0000 "application " | TOPICAL_CREAM | Freq: Two times a day (BID) | CUTANEOUS | Status: DC

## 2014-08-31 ENCOUNTER — Other Ambulatory Visit: Payer: Self-pay | Admitting: Physician Assistant

## 2014-09-30 ENCOUNTER — Other Ambulatory Visit: Payer: Self-pay | Admitting: Physician Assistant

## 2014-10-04 ENCOUNTER — Other Ambulatory Visit: Payer: Self-pay | Admitting: *Deleted

## 2014-10-04 MED ORDER — LORAZEPAM 1 MG PO TABS: ORAL_TABLET | ORAL | Status: DC

## 2014-10-23 ENCOUNTER — Ambulatory Visit: Payer: Self-pay | Admitting: Emergency Medicine

## 2014-10-23 ENCOUNTER — Encounter: Payer: Self-pay | Admitting: Physician Assistant

## 2014-10-23 ENCOUNTER — Ambulatory Visit (INDEPENDENT_AMBULATORY_CARE_PROVIDER_SITE_OTHER): Payer: Medicare Other | Admitting: Physician Assistant

## 2014-10-23 VITALS — BP 122/70 | HR 88 | Temp 98.6°F | Resp 16 | Ht 69.0 in | Wt 163.0 lb

## 2014-10-23 DIAGNOSIS — E782 Mixed hyperlipidemia: Secondary | ICD-10-CM

## 2014-10-23 DIAGNOSIS — I10 Essential (primary) hypertension: Secondary | ICD-10-CM

## 2014-10-23 DIAGNOSIS — R7303 Prediabetes: Secondary | ICD-10-CM

## 2014-10-23 DIAGNOSIS — Z79899 Other long term (current) drug therapy: Secondary | ICD-10-CM

## 2014-10-23 DIAGNOSIS — E559 Vitamin D deficiency, unspecified: Secondary | ICD-10-CM

## 2014-10-23 DIAGNOSIS — N390 Urinary tract infection, site not specified: Secondary | ICD-10-CM

## 2014-10-23 DIAGNOSIS — I480 Paroxysmal atrial fibrillation: Secondary | ICD-10-CM

## 2014-10-23 LAB — BASIC METABOLIC PANEL WITH GFR
BUN: 20 mg/dL (ref 6–23)
CO2: 24 meq/L (ref 19–32)
CREATININE: 1.32 mg/dL (ref 0.50–1.35)
Calcium: 9.1 mg/dL (ref 8.4–10.5)
Chloride: 101 mEq/L (ref 96–112)
GFR, EST AFRICAN AMERICAN: 56 mL/min — AB
GFR, Est Non African American: 49 mL/min — ABNORMAL LOW
GLUCOSE: 93 mg/dL (ref 70–99)
Potassium: 4.8 mEq/L (ref 3.5–5.3)
SODIUM: 137 meq/L (ref 135–145)

## 2014-10-23 LAB — CBC WITH DIFFERENTIAL/PLATELET
BASOS PCT: 0 % (ref 0–1)
Basophils Absolute: 0 10*3/uL (ref 0.0–0.1)
EOS ABS: 0.1 10*3/uL (ref 0.0–0.7)
Eosinophils Relative: 1 % (ref 0–5)
HCT: 41.5 % (ref 39.0–52.0)
Hemoglobin: 13.8 g/dL (ref 13.0–17.0)
LYMPHS ABS: 1.2 10*3/uL (ref 0.7–4.0)
Lymphocytes Relative: 9 % — ABNORMAL LOW (ref 12–46)
MCH: 30.6 pg (ref 26.0–34.0)
MCHC: 33.3 g/dL (ref 30.0–36.0)
MCV: 92 fL (ref 78.0–100.0)
MONO ABS: 0.8 10*3/uL (ref 0.1–1.0)
Monocytes Relative: 6 % (ref 3–12)
NEUTROS PCT: 84 % — AB (ref 43–77)
Neutro Abs: 10.8 10*3/uL — ABNORMAL HIGH (ref 1.7–7.7)
Platelets: 265 10*3/uL (ref 150–400)
RBC: 4.51 MIL/uL (ref 4.22–5.81)
RDW: 15.5 % (ref 11.5–15.5)
WBC: 12.9 10*3/uL — ABNORMAL HIGH (ref 4.0–10.5)

## 2014-10-23 LAB — LIPID PANEL
Cholesterol: 158 mg/dL (ref 0–200)
HDL: 49 mg/dL (ref >39)
LDL Cholesterol: 90 mg/dL (ref 0–99)
Total CHOL/HDL Ratio: 3.2 Ratio
Triglycerides: 97 mg/dL (ref <150)
VLDL: 19 mg/dL (ref 0–40)

## 2014-10-23 LAB — HEPATIC FUNCTION PANEL
ALBUMIN: 4 g/dL (ref 3.5–5.2)
ALT: 16 U/L (ref 0–53)
AST: 22 U/L (ref 0–37)
Alkaline Phosphatase: 73 U/L (ref 39–117)
BILIRUBIN DIRECT: 0.1 mg/dL (ref 0.0–0.3)
Indirect Bilirubin: 0.4 mg/dL (ref 0.2–1.2)
Total Bilirubin: 0.5 mg/dL (ref 0.2–1.2)
Total Protein: 7.5 g/dL (ref 6.0–8.3)

## 2014-10-23 LAB — MAGNESIUM: Magnesium: 1.9 mg/dL (ref 1.5–2.5)

## 2014-10-23 LAB — TSH: TSH: 1.722 u[IU]/mL (ref 0.350–4.500)

## 2014-10-23 MED ORDER — AZITHROMYCIN 250 MG PO TABS: ORAL_TABLET | ORAL | Status: AC

## 2014-10-23 MED ORDER — BENZONATATE 100 MG PO CAPS: 100.0000 mg | ORAL_CAPSULE | Freq: Four times a day (QID) | ORAL | Status: DC | PRN

## 2014-10-23 NOTE — Progress Notes (Addendum)
Assessment and Plan:  Hypertension: Continue medication, monitor blood pressure at home. Continue DASH diet.  Reminder to go to the ER if any CP, SOB, nausea, dizziness, severe HA, changes vision/speech, left arm numbness and tingling, and jaw pain. Cholesterol: Continue diet and exercise. Check cholesterol.  Pre-diabetes-Continue diet and exercise. Check A1C Vitamin D Def- check level and continue medications.  Allergies- will do claritin and if not better will send in ABX, tessalon will be sent in for cough  Continue diet and meds as discussed. Further disposition pending results of labs.  Addendum: + UTI, cipro sent in will recheck urine 1 month.  HPI 80 y.o. male  presents for 3 month follow up with hypertension, hyperlipidemia, prediabetes and vitamin D. His blood pressure has been controlled at home, today their BP is BP: 122/70 mmHg He does not workout. He denies chest pain, shortness of breath, dizziness.  He is not on cholesterol medication and denies myalgias. His cholesterol is at goal. The cholesterol last visit was:   Lab Results  Component Value Date   CHOL 170 12/05/2013   HDL 58 12/05/2013   LDLCALC 75 12/05/2013   TRIG 187* 12/05/2013   CHOLHDL 2.9 12/05/2013   He has been working on diet and exercise for prediabetes, and denies paresthesia of the feet, polydipsia and polyuria. Last A1C in the office was:  Lab Results  Component Value Date   HGBA1C 6.2* 04/19/2014   Patient is on Vitamin D supplement.   Lab Results  Component Value Date   VD25OH 50 04/19/2014     Recent admission to the hospital for acute encephalopathy due to UTI, doing better.  He has had woken up with sore throat, sinus issues, denies fever, chills.   Current Medications:  Current Outpatient Prescriptions on File Prior to Visit  Medication Sig Dispense Refill  . amoxicillin (AMOXIL) 500 MG capsule Take 1 capsule (500 mg total) by mouth 3 (three) times daily. 30 capsule 0  . b complex  vitamins tablet Take 1 tablet by mouth daily.    . Cholecalciferol (VITAMIN D3) 2000 UNITS capsule Take 4,000 Units by mouth daily.     Marland Kitchen docusate sodium (COLACE) 100 MG capsule Take 200 mg by mouth 2 (two) times daily.    . Ensure Plus (ENSURE PLUS) LIQD Take 237 mLs by mouth 2 (two) times daily between meals.    Marland Kitchen LORazepam (ATIVAN) 1 MG tablet Take 1/2 to 1 tab tid PRN for anxiety 90 tablet 5  . mirtazapine (REMERON) 30 MG tablet Take 30 mg by mouth at bedtime.    Marland Kitchen nystatin (MYCOSTATIN) 100000 UNIT/ML suspension 51ml swish and spit 4 times day for 2 days after the symptoms stop. 240 mL 1  . oxybutynin (DITROPAN) 5 MG tablet Take 5 mg by mouth daily as needed for bladder spasms.     . predniSONE (DELTASONE) 10 MG tablet Take 5 mg by mouth daily.     . Probiotic Product (PROBIOTIC DAILY PO) Take 1 capsule by mouth daily.     . sertraline (ZOLOFT) 100 MG tablet TAKE 1 TABLET ONCE DAILY FOR MOOD. 90 tablet 1  . sertraline (ZOLOFT) 50 MG tablet Take 50 mg by mouth daily.    Marland Kitchen triamcinolone cream (KENALOG) 0.1 % Apply 1 application topically 2 (two) times daily. 30 g 3   No current facility-administered medications on file prior to visit.   Medical History:  Past Medical History  Diagnosis Date  . Pancreatitis   . Fractured hip  09/2011  . Prostate tumor     to have bx by Dr. Jasmine December  . UTI (lower urinary tract infection) 06/13/2012    has had x 3 months  . Falls 06/13/12    hit head approx. 1 month ago  . Unsteady gait 06/13/2012    "bad for awhile but getting worse recently"  . Slurred speech     slurred speech on 06/12/12 - didn't last long, slower talking recently  . Cancer     bladder  . Lung abnormality     spot on ct approx 3 months ago - to be followed - [facility]  . 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 Nose fracture     after a fall - Oct. 2012  . Hypertension   . Dysrhythmia     history of a-fib  - followed by Dr. Lovena Le   . Osteopenia   . Fractures,  stress     spine  . 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 Hallucinations     will hospitalized -  . Foley catheter in place   . Anxiety   . Ulcer of ankle     rt  . Atrial fibrillation   . Urinary retention 07/04/2014   Allergies: No Known Allergies   Review of Systems: [X]  = complains of  [ ]  = denies  General: Fatigue [ ]  Fever [ ]  Chills [ ]  Weakness [ ]   Insomnia [ ]  Eyes: Redness [ ]  Blurred vision [ ]  Diplopia [ ]   ENT: Congestion [ ]  Sinus Pain [ ]  Post Nasal Drip [ ]  Sore Throat [ ]  Earache [ ]   Cardiac: Chest pain/pressure [ ]  SOB [ ]  Orthopnea [ ]   Palpitations [ ]   Paroxysmal nocturnal dyspnea[ ]  Claudication [ ]  Edema [ ]   Pulmonary: Cough [ ]  Wheezing[ ]   SOB [ ]   Snoring [ ]   GI: Nausea [ ]  Vomiting[ ]  Dysphagia[ ]  Heartburn[ ]  Abdominal pain [ ]  Constipation [ ] ; Diarrhea [ ] ; BRBPR [ ]  Melena[ ]  GU: Hematuria[ ]  Dysuria [ ]  Nocturia[ ]  Urgency [ ]   Hesitancy [ ]  Discharge [ ]  Neuro: Headaches[ ]  Vertigo[ ]  Paresthesias[ ]  Spasm [ ]  Speech changes [ ]  Incoordination [ ]   Ortho: Arthritis [ ]  Joint pain [ ]  Muscle pain [ ]  Joint swelling [ ]  Back Pain [ ]  Skin:  Rash [ ]   Pruritis [ ]  Change in skin lesion [ ]   Psych: Depression[ ]  Anxiety[ ]  Confusion [ ]  Memory loss [ ]   Heme/Lypmh: Bleeding [ ]  Bruising [ ]  Enlarged lymph nodes [ ]   Endocrine: Visual blurring [ ]  Paresthesia [ ]  Polyuria [ ]  Polydypsea [ ]    Heat/cold intolerance [ ]  Hypoglycemia [ ]   Family history- Review and unchanged Social history- Review and unchanged Physical Exam: BP 122/70 mmHg  Pulse 88  Temp(Src) 98.6 F (37 C)  Resp 16  Ht 5\' 9"  (1.753 m)  Wt 163 lb (73.936 kg)  BMI 24.06 kg/m2 Wt Readings from Last 3 Encounters:  10/23/14 163 lb (73.936 kg)  07/19/14 155 lb (70.308 kg)  07/03/14 157 lb (71.215 kg)   HEENT: normocephalic, sclerae anicteric, TMs pearly, nares patent, with white patches on posterior pharynx and erythematous tongue.  Oral cavity:  MMM, no lesions  Neck: supple, no lymphadenopathy, no thyromegaly, no masses  Heart: RRR, normal S1, S2  Lungs: CTA bilaterally, no wheezes, rhonchi, or rales  Abdomen: +bs, soft, non tender, non distended, with suprapubic catheter in place, draining Musculoskeletal: nontender, no swelling, no obvious deformity, walks with walker Extremities: no edema, no cyanosis, no clubbing  Pulses: 2+ symmetric, upper and lower extremities, normal cap refill  Neurological: alert, oriented x 3, CN2-12 intact, 4/5 upper extremities and lower extremities, throughout, walks with a walker Psychiatric: normal affect, very pleasant     Vicie Mutters, PA-C 3:07 PM [facility]

## 2014-10-24 LAB — VITAMIN D 25 HYDROXY (VIT D DEFICIENCY, FRACTURES): VIT D 25 HYDROXY: 63 ng/mL (ref 30–89)

## 2014-10-24 LAB — HEMOGLOBIN A1C
HEMOGLOBIN A1C: 6 % — AB (ref <5.7)
Mean Plasma Glucose: 126 mg/dL — ABNORMAL HIGH (ref <117)

## 2014-10-26 LAB — URINE CULTURE: Colony Count: >=100000

## 2014-10-26 MED ORDER — CIPROFLOXACIN HCL 500 MG PO TABS: 500.0000 mg | ORAL_TABLET | Freq: Two times a day (BID) | ORAL | Status: DC

## 2014-10-26 NOTE — Addendum Note (Signed)
Addended by: Vicie Mutters R on: 10/26/2014 02:33 PM   Modules accepted: Orders

## 2014-11-06 ENCOUNTER — Encounter: Payer: Self-pay | Admitting: Physician Assistant

## 2014-11-07 ENCOUNTER — Other Ambulatory Visit: Payer: Self-pay

## 2014-11-07 DIAGNOSIS — R059 Cough, unspecified: Secondary | ICD-10-CM

## 2014-11-07 DIAGNOSIS — R05 Cough: Secondary | ICD-10-CM

## 2014-11-08 ENCOUNTER — Ambulatory Visit: Payer: Self-pay | Admitting: Physician Assistant

## 2014-11-14 ENCOUNTER — Ambulatory Visit (HOSPITAL_COMMUNITY)
Admission: RE | Admit: 2014-11-14 | Discharge: 2014-11-14 | Disposition: A | Discharge status: Home / Self Care | Payer: Medicare Other | Source: Ambulatory Visit | Attending: Internal Medicine | Admitting: Internal Medicine

## 2014-11-14 DIAGNOSIS — J439 Emphysema, unspecified: Secondary | ICD-10-CM | POA: Insufficient documentation

## 2014-11-14 DIAGNOSIS — R059 Cough, unspecified: Secondary | ICD-10-CM

## 2014-11-14 DIAGNOSIS — R918 Other nonspecific abnormal finding of lung field: Secondary | ICD-10-CM | POA: Insufficient documentation

## 2014-11-14 DIAGNOSIS — Z8546 Personal history of malignant neoplasm of prostate: Secondary | ICD-10-CM | POA: Diagnosis present

## 2014-11-14 DIAGNOSIS — J984 Other disorders of lung: Secondary | ICD-10-CM | POA: Diagnosis not present

## 2014-11-14 DIAGNOSIS — R05 Cough: Secondary | ICD-10-CM | POA: Diagnosis present

## 2014-11-15 ENCOUNTER — Other Ambulatory Visit: Payer: Self-pay | Admitting: Internal Medicine

## 2014-11-15 DIAGNOSIS — J189 Pneumonia, unspecified organism: Secondary | ICD-10-CM

## 2014-11-15 MED ORDER — LEVOFLOXACIN 500 MG PO TABS: ORAL_TABLET | ORAL | Status: AC

## 2014-11-15 MED ORDER — LEVOFLOXACIN 500 MG PO TABS: ORAL_TABLET | ORAL | Status: DC

## 2014-11-21 ENCOUNTER — Other Ambulatory Visit: Payer: Self-pay | Admitting: Internal Medicine

## 2014-11-21 DIAGNOSIS — R9389 Abnormal findings on diagnostic imaging of other specified body structures: Secondary | ICD-10-CM

## 2014-11-27 ENCOUNTER — Other Ambulatory Visit: Payer: Medicare Other

## 2014-11-27 DIAGNOSIS — N3000 Acute cystitis without hematuria: Secondary | ICD-10-CM

## 2014-11-28 LAB — URINALYSIS, ROUTINE W REFLEX MICROSCOPIC
Bilirubin Urine: NEGATIVE
GLUCOSE, UA: NEGATIVE mg/dL
Hgb urine dipstick: NEGATIVE
Ketones, ur: NEGATIVE mg/dL
Nitrite: NEGATIVE
Protein, ur: NEGATIVE mg/dL
Specific Gravity, Urine: <1.005 — ABNORMAL LOW (ref 1.005–1.030)
Urobilinogen, UA: 0.2 mg/dL (ref 0.0–1.0)
pH: 6.5 (ref 5.0–8.0)

## 2014-11-28 LAB — URINALYSIS, MICROSCOPIC ONLY
Bacteria, UA: NONE SEEN
CASTS: NONE SEEN
CRYSTALS: NONE SEEN
SQUAMOUS EPITHELIAL / LPF: NONE SEEN

## 2014-11-28 LAB — URINE CULTURE
COLONY COUNT: NO GROWTH
ORGANISM ID, BACTERIA: NO GROWTH

## 2014-12-04 ENCOUNTER — Ambulatory Visit (HOSPITAL_COMMUNITY)
Admission: RE | Admit: 2014-12-04 | Discharge: 2014-12-04 | Disposition: A | Discharge status: Home / Self Care | Payer: Medicare Other | Source: Ambulatory Visit | Attending: Internal Medicine | Admitting: Internal Medicine

## 2014-12-04 DIAGNOSIS — R938 Abnormal findings on diagnostic imaging of other specified body structures: Secondary | ICD-10-CM | POA: Diagnosis present

## 2014-12-04 DIAGNOSIS — Q338 Other congenital malformations of lung: Secondary | ICD-10-CM | POA: Diagnosis not present

## 2014-12-04 DIAGNOSIS — R9389 Abnormal findings on diagnostic imaging of other specified body structures: Secondary | ICD-10-CM

## 2014-12-06 ENCOUNTER — Telehealth: Payer: Self-pay | Admitting: *Deleted

## 2014-12-06 MED ORDER — PROMETHAZINE-CODEINE 6.25-10 MG/5ML PO SYRP: 5.0000 mL | ORAL_SOLUTION | ORAL | Status: DC | PRN

## 2014-12-06 NOTE — Telephone Encounter (Signed)
Daughter called and states patient has a cough not relieved by Ladona Ridgel and Delsym.  OK to call in Phenergan with codeine.

## 2014-12-10 ENCOUNTER — Telehealth: Payer: Self-pay | Admitting: *Deleted

## 2014-12-10 ENCOUNTER — Ambulatory Visit (INDEPENDENT_AMBULATORY_CARE_PROVIDER_SITE_OTHER): Payer: Medicare Other | Admitting: Emergency Medicine

## 2014-12-10 ENCOUNTER — Ambulatory Visit (INDEPENDENT_AMBULATORY_CARE_PROVIDER_SITE_OTHER): Payer: Medicare Other

## 2014-12-10 ENCOUNTER — Other Ambulatory Visit: Payer: Self-pay | Admitting: Internal Medicine

## 2014-12-10 VITALS — BP 151/77 | HR 120 | Temp 98.2°F | Resp 18 | Wt 159.0 lb

## 2014-12-10 DIAGNOSIS — W19XXXA Unspecified fall, initial encounter: Secondary | ICD-10-CM

## 2014-12-10 DIAGNOSIS — R079 Chest pain, unspecified: Secondary | ICD-10-CM

## 2014-12-10 MED ORDER — ACETAMINOPHEN-CODEINE #3 300-30 MG PO TABS: 1.0000 | ORAL_TABLET | ORAL | Status: DC | PRN

## 2014-12-10 NOTE — Patient Instructions (Signed)

## 2014-12-10 NOTE — Telephone Encounter (Signed)
Daughter aware of chest x-ray results and has taken patient to an urgent care for his pain.

## 2014-12-10 NOTE — Progress Notes (Signed)
[facility] [address], [place] [address] [phone]  Date:  12/10/2014   Name:  Brent Day.   DOB:  September 29, 1927   MRN:  [id number]  PCP:  Alesia Richards, MD    Chief Complaint: Fall   History of Present Illness:  Brent Day. is a 80 y.o. very pleasant male patient who presents with the following:  Elderly man with multiple medical problems. Had a bad dream and reached out to a table to remove something related to his dream and rolled out of bed and struck his chest Has pleuritic anterior chest and multiple chest wall contusion and contusion to both arms and knees Denies hemoptysis or nausea or vomiting. Current tetanus No stool change No fever or chills Denies other complaint or health concern today.   Patient Active Problem List   Diagnosis Date Noted  . Urinary retention 07/04/2014  . Acute encephalopathy 07/03/2014  . AKI (acute kidney injury) 07/03/2014  . Vitamin D deficiency 04/19/2014  . Hyperlipidemia 04/19/2014  . Prediabetes 04/19/2014  . Encounter for long-term (current) use of medications 04/19/2014  . Pulmonary nodule 11/24/2012  . UTI (lower urinary tract infection) 07/27/2012  . Bladder cancer   . Generalized weakness 06/13/2012  . FTT (failure to thrive) in adult 06/13/2012  . Essential hypertension 10/18/2009  . Atrial fibrillation 10/18/2009  . 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 Pancreatitis   . Fractured hip 09/2011  . Prostate tumor     to have bx by Dr. Jasmine December  . UTI (lower urinary tract infection) 06/13/2012    has had x 3 months  . Falls 06/13/12    hit head approx. 1 month ago  . Unsteady gait 06/13/2012    "bad for awhile but getting worse recently"  . Slurred speech     slurred speech on 06/12/12 - didn't last long, slower talking recently  . Cancer     bladder  . Lung abnormality     spot on ct approx 3 months ago - to be followed - [facility]  . 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 Nose fracture     after a fall - Oct. 2012  . Hypertension   . Dysrhythmia     history of a-fib  - followed by Dr. Lovena Le   . Osteopenia   . Fractures, stress     spine  . 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 Hallucinations     will hospitalized -  . Foley catheter in place   . Anxiety   . Ulcer of ankle     rt  . Atrial fibrillation   . Urinary retention 07/04/2014    Past Surgical History  Procedure Laterality Date  . Cholecystectomy    . Partial hip arthroplasty    . Cystoscopy      has yearly due to bladder cancer  . Retinal detachment surgery      at least 3 and have been in both eyes  . Transurethral resection of bladder tumor    . Transurethral resection of prostate  01/18/2013    Procedure: TRANSURETHRAL RESECTION OF THE PROSTATE WITH GYRUS INSTRUMENTS;  Surgeon: Molli Hazard, MD;  Location: [facility];  Service: Urology;  Laterality: N/A;  CYSTOSCOPY, SUPRAPUBIC TUBE PLACEMENT, CHANNEL TURP, RECTAL EXAM    . Insertion of suprapubic catheter  01/18/2013  Procedure: INSERTION OF SUPRAPUBIC CATHETER;  Surgeon: Molli Hazard, MD;  Location: [facility];  Service: Urology;  Laterality: N/A;  . Cystoscopy  01/18/2013    Procedure: CYSTOSCOPY;  Surgeon: Molli Hazard, MD;  Location: [facility];  Service: Urology;  Laterality: N/A;    History  Substance Use Topics  . Smoking status: Current Every Day Smoker -- 0.25 packs/day for 62 years    Types: Cigarettes  . Smokeless tobacco: Not on file  . Alcohol Use: No    Family History  Problem Relation Age of Onset  . Heart disease      No family history  . Pancreatitis Mother   . Alcohol abuse Father     No Known Allergies  Medication list has been reviewed and updated.  Current Outpatient Prescriptions on File Prior to Visit  Medication Sig Dispense Refill  . b complex vitamins tablet Take 1 tablet by  mouth daily.    . Cholecalciferol (VITAMIN D3) 2000 UNITS capsule Take 4,000 Units by mouth daily.     Marland Kitchen docusate sodium (COLACE) 100 MG capsule Take 200 mg by mouth 2 (two) times daily.    . Ensure Plus (ENSURE PLUS) LIQD Take 237 mLs by mouth 2 (two) times daily between meals.    Marland Kitchen FLUZONE HIGH-DOSE 0.5 ML SUSY   0  . ketoconazole (NIZORAL) 2 % cream     . LORazepam (ATIVAN) 1 MG tablet     . mirtazapine (REMERON) 30 MG tablet Take 15 mg by mouth at bedtime.     Marland Kitchen nystatin (MYCOSTATIN) 100000 UNIT/ML suspension 60ml swish and spit 4 times day for 2 days after the symptoms stop. 240 mL 1  . oxybutynin (DITROPAN) 5 MG tablet Take 5 mg by mouth daily as needed for bladder spasms.     . predniSONE (DELTASONE) 10 MG tablet Take 5 mg by mouth daily.     . Probiotic Product (PROBIOTIC DAILY PO) Take 1 capsule by mouth daily.     . promethazine-codeine (PHENERGAN WITH CODEINE) 6.25-10 MG/5ML syrup Take 5 mLs by mouth every 4 (four) hours as needed for cough. 120 mL 0  . sertraline (ZOLOFT) 100 MG tablet TAKE 1 TABLET ONCE DAILY FOR MOOD. 90 tablet 1  . triamcinolone cream (KENALOG) 0.1 % Apply 1 application topically 2 (two) times daily. 30 g 3   No current facility-administered medications on file prior to visit.    Review of Systems:  As per HPI, otherwise negative.    Physical Examination: Filed Vitals:   12/10/14 1541  BP: 151/77  Pulse: 120  Temp: 98.2 F (36.8 C)  Resp: 18   Filed Vitals:   12/10/14 1541  Weight: 159 lb (72.122 kg)   Body mass index is 23.47 kg/(m^2). Ideal Body Weight:    GEN: WDWN, NAD, Non-toxic, A & O x 3 HEENT: Atraumatic, Normocephalic. Neck supple. No masses, No LAD. Ears and Nose: No external deformity. CV: RRR, No M/G/R. No JVD. No thrill. No extra heart sounds. PULM: CTA B, no wheezes, crackles, rhonchi. No retractions. No resp. distress. No accessory muscle use. Chest multiple contusion anterior chest with tenderness ABD: S, NT, ND, +BS. No  rebound. No HSM. EXTR: No c/c/e NEURO Normal gait.  PSYCH: Normally interactive. Conversant. Not depressed or anxious appearing.  Calm demeanor.    Assessment and Plan: Chest wall contusion   Signed,  Ellison Carwin, MD   [facility] reading (PRIMARY) by  Dr. Ouida Sills.  COPD no acute change.

## 2014-12-11 ENCOUNTER — Other Ambulatory Visit: Payer: Self-pay

## 2014-12-12 ENCOUNTER — Other Ambulatory Visit: Payer: Self-pay

## 2014-12-12 DIAGNOSIS — R9389 Abnormal findings on diagnostic imaging of other specified body structures: Secondary | ICD-10-CM

## 2014-12-17 ENCOUNTER — Other Ambulatory Visit: Payer: Self-pay

## 2014-12-17 DIAGNOSIS — Z01812 Encounter for preprocedural laboratory examination: Secondary | ICD-10-CM

## 2014-12-18 ENCOUNTER — Other Ambulatory Visit: Payer: Self-pay | Admitting: Internal Medicine

## 2014-12-18 LAB — BASIC METABOLIC PANEL
BUN: 23 mg/dL (ref 6–23)
CO2: 25 meq/L (ref 19–32)
Calcium: 8.8 mg/dL (ref 8.4–10.5)
Chloride: 105 mEq/L (ref 96–112)
Creat: 1.23 mg/dL (ref 0.50–1.35)
GLUCOSE: 113 mg/dL — AB (ref 70–99)
POTASSIUM: 4.8 meq/L (ref 3.5–5.3)
Sodium: 137 mEq/L (ref 135–145)

## 2014-12-19 ENCOUNTER — Ambulatory Visit (HOSPITAL_COMMUNITY)
Admission: RE | Admit: 2014-12-19 | Discharge: 2014-12-19 | Disposition: A | Discharge status: Home / Self Care | Payer: Medicare Other | Source: Ambulatory Visit | Attending: Internal Medicine | Admitting: Internal Medicine

## 2014-12-19 DIAGNOSIS — R938 Abnormal findings on diagnostic imaging of other specified body structures: Secondary | ICD-10-CM | POA: Insufficient documentation

## 2014-12-19 DIAGNOSIS — R9389 Abnormal findings on diagnostic imaging of other specified body structures: Secondary | ICD-10-CM

## 2014-12-19 DIAGNOSIS — R911 Solitary pulmonary nodule: Secondary | ICD-10-CM | POA: Diagnosis not present

## 2014-12-19 DIAGNOSIS — J432 Centrilobular emphysema: Secondary | ICD-10-CM | POA: Diagnosis not present

## 2014-12-19 MED ORDER — IOHEXOL 300 MG/ML  SOLN
100.0000 mL | Freq: Once | INTRAMUSCULAR | Status: AC | PRN
  Administered 2014-12-19: 100 mL via INTRAVENOUS

## 2014-12-24 ENCOUNTER — Encounter: Payer: Self-pay | Admitting: Internal Medicine

## 2015-01-15 DIAGNOSIS — N139 Obstructive and reflux uropathy, unspecified: Secondary | ICD-10-CM | POA: Diagnosis not present

## 2015-01-15 DIAGNOSIS — N320 Bladder-neck obstruction: Secondary | ICD-10-CM | POA: Diagnosis not present

## 2015-01-22 ENCOUNTER — Ambulatory Visit (INDEPENDENT_AMBULATORY_CARE_PROVIDER_SITE_OTHER): Payer: Medicare Other | Admitting: Physician Assistant

## 2015-01-22 ENCOUNTER — Encounter: Payer: Self-pay | Admitting: Physician Assistant

## 2015-01-22 VITALS — BP 132/64 | HR 86 | Temp 98.0°F | Resp 16 | Ht 67.0 in | Wt 157.0 lb

## 2015-01-22 DIAGNOSIS — I10 Essential (primary) hypertension: Secondary | ICD-10-CM

## 2015-01-22 DIAGNOSIS — Z0001 Encounter for general adult medical examination with abnormal findings: Secondary | ICD-10-CM | POA: Diagnosis not present

## 2015-01-22 DIAGNOSIS — F329 Major depressive disorder, single episode, unspecified: Secondary | ICD-10-CM

## 2015-01-22 DIAGNOSIS — I480 Paroxysmal atrial fibrillation: Secondary | ICD-10-CM

## 2015-01-22 DIAGNOSIS — Z79899 Other long term (current) drug therapy: Secondary | ICD-10-CM | POA: Diagnosis not present

## 2015-01-22 DIAGNOSIS — F32A Depression, unspecified: Secondary | ICD-10-CM

## 2015-01-22 DIAGNOSIS — R7309 Other abnormal glucose: Secondary | ICD-10-CM

## 2015-01-22 DIAGNOSIS — H3530 Unspecified macular degeneration: Secondary | ICD-10-CM | POA: Insufficient documentation

## 2015-01-22 DIAGNOSIS — E782 Mixed hyperlipidemia: Secondary | ICD-10-CM

## 2015-01-22 DIAGNOSIS — R6889 Other general symptoms and signs: Secondary | ICD-10-CM

## 2015-01-22 DIAGNOSIS — E559 Vitamin D deficiency, unspecified: Secondary | ICD-10-CM

## 2015-01-22 DIAGNOSIS — C679 Malignant neoplasm of bladder, unspecified: Secondary | ICD-10-CM

## 2015-01-22 DIAGNOSIS — N390 Urinary tract infection, site not specified: Secondary | ICD-10-CM

## 2015-01-22 DIAGNOSIS — R627 Adult failure to thrive: Secondary | ICD-10-CM

## 2015-01-22 DIAGNOSIS — R7303 Prediabetes: Secondary | ICD-10-CM

## 2015-01-22 DIAGNOSIS — R339 Retention of urine, unspecified: Secondary | ICD-10-CM

## 2015-01-22 DIAGNOSIS — R531 Weakness: Secondary | ICD-10-CM

## 2015-01-22 DIAGNOSIS — L98491 Non-pressure chronic ulcer of skin of other sites limited to breakdown of skin: Secondary | ICD-10-CM

## 2015-01-22 DIAGNOSIS — Z9181 History of falling: Secondary | ICD-10-CM

## 2015-01-22 DIAGNOSIS — I495 Sick sinus syndrome: Secondary | ICD-10-CM

## 2015-01-22 DIAGNOSIS — N179 Acute kidney failure, unspecified: Secondary | ICD-10-CM

## 2015-01-22 DIAGNOSIS — R911 Solitary pulmonary nodule: Secondary | ICD-10-CM

## 2015-01-22 LAB — BASIC METABOLIC PANEL WITH GFR
BUN: 21 mg/dL (ref 6–23)
CO2: 27 mEq/L (ref 19–32)
Calcium: 9.1 mg/dL (ref 8.4–10.5)
Chloride: 103 mEq/L (ref 96–112)
Creat: 1.15 mg/dL (ref 0.50–1.35)
GFR, EST AFRICAN AMERICAN: 67 mL/min
GFR, EST NON AFRICAN AMERICAN: 58 mL/min — AB
Glucose, Bld: 77 mg/dL (ref 70–99)
POTASSIUM: 4.5 meq/L (ref 3.5–5.3)
SODIUM: 137 meq/L (ref 135–145)

## 2015-01-22 LAB — HEPATIC FUNCTION PANEL
ALBUMIN: 3.6 g/dL (ref 3.5–5.2)
ALK PHOS: 65 U/L (ref 39–117)
ALT: 15 U/L (ref 0–53)
AST: 21 U/L (ref 0–37)
BILIRUBIN DIRECT: 0.1 mg/dL (ref 0.0–0.3)
Indirect Bilirubin: 0.4 mg/dL (ref 0.2–1.2)
Total Bilirubin: 0.5 mg/dL (ref 0.2–1.2)
Total Protein: 6.9 g/dL (ref 6.0–8.3)

## 2015-01-22 LAB — LIPID PANEL
CHOL/HDL RATIO: 3.2 ratio
CHOLESTEROL: 151 mg/dL (ref 0–200)
HDL: 47 mg/dL (ref >39)
LDL Cholesterol: 81 mg/dL (ref 0–99)
Triglycerides: 116 mg/dL (ref <150)
VLDL: 23 mg/dL (ref 0–40)

## 2015-01-22 LAB — CBC WITH DIFFERENTIAL/PLATELET
BASOS ABS: 0 10*3/uL (ref 0.0–0.1)
Basophils Relative: 0 % (ref 0–1)
EOS ABS: 0.3 10*3/uL (ref 0.0–0.7)
Eosinophils Relative: 3 % (ref 0–5)
HCT: 43 % (ref 39.0–52.0)
HEMOGLOBIN: 14 g/dL (ref 13.0–17.0)
LYMPHS ABS: 1.1 10*3/uL (ref 0.7–4.0)
Lymphocytes Relative: 10 % — ABNORMAL LOW (ref 12–46)
MCH: 30.4 pg (ref 26.0–34.0)
MCHC: 32.6 g/dL (ref 30.0–36.0)
MCV: 93.3 fL (ref 78.0–100.0)
MPV: 9.3 fL (ref 8.6–12.4)
Monocytes Absolute: 0.9 10*3/uL (ref 0.1–1.0)
Monocytes Relative: 8 % (ref 3–12)
Neutro Abs: 8.6 10*3/uL — ABNORMAL HIGH (ref 1.7–7.7)
Neutrophils Relative %: 79 % — ABNORMAL HIGH (ref 43–77)
PLATELETS: 253 10*3/uL (ref 150–400)
RBC: 4.61 MIL/uL (ref 4.22–5.81)
RDW: 15.6 % — ABNORMAL HIGH (ref 11.5–15.5)
WBC: 10.9 10*3/uL — AB (ref 4.0–10.5)

## 2015-01-22 LAB — MAGNESIUM: MAGNESIUM: 2.1 mg/dL (ref 1.5–2.5)

## 2015-01-22 MED ORDER — CLOTRIMAZOLE-BETAMETHASONE 1-0.05 % EX CREA: 1.0000 "application " | TOPICAL_CREAM | Freq: Two times a day (BID) | CUTANEOUS | Status: DC

## 2015-01-22 MED ORDER — CITALOPRAM HYDROBROMIDE 20 MG PO TABS: 20.0000 mg | ORAL_TABLET | Freq: Every day | ORAL | Status: DC

## 2015-01-22 NOTE — Progress Notes (Addendum)
MEDICARE ANNUAL WELLNESS VISIT AND CPE  Assessment:   1. Essential hypertension - continue medications, DASH diet, exercise and monitor at home. Call if greater than 130/80.  - CBC with Differential/Platelet - BASIC METABOLIC PANEL WITH GFR - Hepatic function panel - TSH - Urinalysis, Routine w reflex microscopic - Microalbumin / creatinine urine ratio - EKG 12-Lead  2. Paroxysmal atrial fibrillation NSR, high risk fall, not on coag medication  3. Prediabetes Discussed general issues about diabetes pathophysiology and management., Educational material distributed., Suggested low cholesterol diet., Encouraged aerobic exercise., Discussed foot care., Reminded to get yearly retinal exam. - Hemoglobin A1c - HM DIABETES FOOT EXAM  4. SINUS BRADYCARDIA stable  5. Urinary retention better  6. UTI (lower urinary tract infection) - Urine culture  7. Malignant neoplasm of urinary bladder, unspecified site Continue follow up  8. AKI (acute kidney injury) Check BMP  9. Generalized weakness Check labs  10. FTT (failure to thrive) in adult Doing better, having weight gain and is doing better over all  11. Pulmonary nodule Will repeat in June/July  12. Vitamin D deficiency - Vit D  25 hydroxy (rtn osteoporosis monitoring)  13. Hyperlipidemia -continue medications, check lipids, decrease fatty foods, increase activity.  - Lipid panel   14. Encounter for long-term (current) use of medications - Magnesium  15. Macular degeneration Continue follow up  16. Ulcer of skin, limited to breakdown of skin Has erythema/rash around anus with small skin breakdown at 3oclock - clotrimazole-betamethasone (LOTRISONE) cream; Apply 1 application topically 2 (two) times daily.  Dispense: 15 g; Refill: 2  17. Depression Stop zoloft and try celexa.  - citalopram (CELEXA) 20 MG tablet; Take 1 tablet (20 mg total) by mouth daily.  Dispense: 30 tablet; Refill: 2  Addendum: + UTI,  sensitive to bactrim, will recheck urine 1 month.    Plan:   During the course of the visit the patient was educated and counseled about appropriate screening and preventive services including:    Pneumococcal vaccine   Influenza vaccine  Td vaccine  Screening electrocardiogram  Bone densitometry screening  Colorectal cancer screening  Diabetes screening  Glaucoma screening  Nutrition counseling   Advanced directives: requested  Screening recommendations, referrals: Vaccinations: Please see documentation below and orders this visit.  Nutrition assessed and recommended  Colonoscopy declined Recommended yearly ophthalmology/optometry visit for glaucoma screening and checkup Recommended yearly dental visit for hygiene and checkup Advanced directives - requested  Conditions/risks identified: BMI: Discussed weight loss, diet, and increase physical activity.  Increase physical activity: AHA recommends 150 minutes of physical activity a week.  Medications reviewed Diabetes is at goal, ACE/ARB therapy: No, Reason not on Ace Inhibitor/ARB therapy:  preDM Urinary Incontinence is an issue: discussed non pharmacology and pharmacology options.  Fall risk: low- discussed PT, home fall assessment, medications.    Subjective:  Brent Day. is a 81 y.o. male who presents for Medicare Annual Wellness Visit and complete physical.  Date of last medicare wellness visit is unknown.   His blood pressure has been controlled at home, today their BP is BP: 132/64 mmHg He does not workout. He denies chest pain, shortness of breath, dizziness.  He is not on cholesterol medication and denies myalgias. His cholesterol is not at goal. The cholesterol last visit was:   Lab Results  Component Value Date   CHOL 158 10/23/2014   HDL 49 10/23/2014   LDLCALC 90 10/23/2014   TRIG 97 10/23/2014   CHOLHDL 3.2 10/23/2014  He has been working on diet and exercise for prediabetes, and  denies paresthesia of the feet, polydipsia, polyuria and visual disturbances. Last A1C in the office was:  Lab Results  Component Value Date   HGBA1C 6.0* 10/23/2014   Patient is on Vitamin D supplement.   Lab Results  Component Value Date   VD25OH 44 10/23/2014     He has a history of prostate cancer and bladder cancer, has suprapubic catheter and doing well. Has had some recurrent UTI's and he was being followed by Dr. Jasmine December and is now being follows by Dr. Tammi Klippel.  He has weakness, walks with walker, had a fall in Dec.  He had CT chest that showed new 2.6 cm illdefined nodule, suggest repeat 3-6 months to rule out cancer. Suggest recheck in 1-2 months without contrast.  He has some depression from his wife passing and his struggling health, he is on zoloft but daughter wants to know if this is still working.  He has a history of Afib, he is no on anticoagulation due to fall risk.   Names of Other Physician/Practitioners you currently use: 1. [facility] here for primary care 2. Dr. Zadie Rhine, eye doctor, last visit 6 months ago 3. Dr. Katy Fitch, eye doctor, 1 month ago.  Patient Care Team: Unk Pinto, MD as PCP - General (Internal Medicine)   Medication Review: Current Outpatient Prescriptions on File Prior to Visit  Medication Sig Dispense Refill  . acetaminophen-codeine (TYLENOL #3) 300-30 MG per tablet Take 1 tablet by mouth every 4 (four) hours as needed. 30 tablet 0  . b complex vitamins tablet Take 1 tablet by mouth daily.    . Cholecalciferol (VITAMIN D3) 2000 UNITS capsule Take 4,000 Units by mouth daily.     Marland Kitchen docusate sodium (COLACE) 100 MG capsule Take 200 mg by mouth 2 (two) times daily.    . Ensure Plus (ENSURE PLUS) LIQD Take 237 mLs by mouth 2 (two) times daily between meals.    Marland Kitchen ketoconazole (NIZORAL) 2 % cream     . LORazepam (ATIVAN) 1 MG tablet     . mirtazapine (REMERON) 30 MG tablet Take 15 mg by mouth at bedtime.      Marland Kitchen nystatin (MYCOSTATIN) 100000 UNIT/ML suspension 63ml swish and spit 4 times day for 2 days after the symptoms stop. 240 mL 1  . oxybutynin (DITROPAN) 5 MG tablet Take 5 mg by mouth daily as needed for bladder spasms.     . predniSONE (DELTASONE) 10 MG tablet Take 5 mg by mouth daily.     . Probiotic Product (PROBIOTIC DAILY PO) Take 1 capsule by mouth daily.     . promethazine-codeine (PHENERGAN WITH CODEINE) 6.25-10 MG/5ML syrup Take 5 mLs by mouth every 4 (four) hours as needed for cough. 120 mL 0  . sertraline (ZOLOFT) 100 MG tablet TAKE 1 TABLET ONCE DAILY FOR MOOD. 90 tablet 1  . triamcinolone cream (KENALOG) 0.1 % Apply 1 application topically 2 (two) times daily. 30 g 3   No current facility-administered medications on file prior to visit.    Current Problems (verified) Patient Active Problem List   Diagnosis Date Noted  . Urinary retention 07/04/2014  . Acute encephalopathy 07/03/2014  . AKI (acute kidney injury) 07/03/2014  . Vitamin D deficiency 04/19/2014  . Hyperlipidemia 04/19/2014  . Prediabetes 04/19/2014  . Encounter for long-term (current) use of medications 04/19/2014  . Pulmonary nodule 11/24/2012  . UTI (lower urinary tract infection) 07/27/2012  .  Bladder cancer   . Generalized weakness 06/13/2012  . FTT (failure to thrive) in adult 06/13/2012  . Essential hypertension 10/18/2009  . Atrial fibrillation 10/18/2009  . SINUS BRADYCARDIA 10/18/2009    Screening Tests Health Maintenance  Topic Date Due  . INFLUENZA VACCINE  07/22/2015  . TETANUS/TDAP  08/16/2023  . PNEUMOCOCCAL POLYSACCHARIDE VACCINE AGE 68 AND OVER  Completed  . ZOSTAVAX  Completed    Immunization History  Administered Date(s) Administered  . Influenza, High Dose Seasonal PF 10/05/2013  . Influenza-Unspecified 10/21/2012, 09/26/2014  . Pneumococcal-Unspecified 10/05/2011  . Td 08/15/2013  . Zoster 01/19/2012    Preventative care: Tetanus: 2014 Pneumovax: 2012 Prevnar 13: Flu  vaccine: 2015 Zostavax: 2013 DEXA: Colonoscopy: 2004 EGD: 2012- Dr. Paulita Fujita CT chest 11/2014- needs repeat for ground glass, and + fractures.  MGM 04/2014 Echo: 08/2007 normal EF, Dr. Lovena Le ABI legs 2013 Eye Exam: [facility] Dentist: None  History reviewed: allergies, current medications, past family history, past medical history, past social history, past surgical history and problem list  Past Surgical History  Procedure Laterality Date  . Cholecystectomy    . Partial hip arthroplasty    . Cystoscopy      has yearly due to bladder cancer  . Retinal detachment surgery      at least 3 and have been in both eyes  . Transurethral resection of bladder tumor    . Transurethral resection of prostate  01/18/2013    Procedure: TRANSURETHRAL RESECTION OF THE PROSTATE WITH GYRUS INSTRUMENTS;  Surgeon: Molli Hazard, MD;  Location: [facility];  Service: Urology;  Laterality: N/A;  CYSTOSCOPY, SUPRAPUBIC TUBE PLACEMENT, CHANNEL TURP, RECTAL EXAM    . Insertion of suprapubic catheter  01/18/2013    Procedure: INSERTION OF SUPRAPUBIC CATHETER;  Surgeon: Molli Hazard, MD;  Location: [facility];  Service: Urology;  Laterality: N/A;  . Cystoscopy  01/18/2013    Procedure: CYSTOSCOPY;  Surgeon: Molli Hazard, MD;  Location: [facility];  Service: Urology;  Laterality: N/A;   Family History  Problem Relation Age of Onset  . Heart disease      No family history  . Pancreatitis Mother   . Alcohol abuse Father     Review of Systems  Constitutional: Positive for weight loss and malaise/fatigue. Negative for fever, chills and diaphoresis.  HENT: Negative.   Respiratory: Negative.   Cardiovascular: Negative.   Gastrointestinal: Positive for heartburn and constipation. Negative for nausea, vomiting, abdominal pain, diarrhea, blood in stool and melena.  Genitourinary: Positive for frequency. Negative for dysuria, urgency, hematuria and flank pain.  Musculoskeletal: Positive for myalgias,  back pain, joint pain and falls. Negative for neck pain.  Skin: Negative.   Neurological: Negative.  Negative for weakness.  Endo/Heme/Allergies: Negative.   Psychiatric/Behavioral: Positive for depression and memory loss. Negative for suicidal ideas, hallucinations and substance abuse. The patient is not nervous/anxious and does not have insomnia.       Risk Factors: Tobacco History  Substance Use Topics  . Smoking status: Current Every Day Smoker -- 0.25 packs/day for 62 years    Types: Cigarettes  . Smokeless tobacco: Not on file  . Alcohol Use: No   He does not smoke.  Patient is a former smoker. Are there smokers in your home (other than you)?  No  Alcohol Current alcohol use: none  Caffeine Current caffeine use: coffee 1 /day  Exercise Current exercise: does some   Nutrition/Diet Current diet: in general, a "healthy" diet    Cardiac  risk factors: advanced age (older than 64 for men, 76 for women), dyslipidemia, family history of premature cardiovascular disease, hypertension, male gender and sedentary lifestyle.  Depression Screen (Note: if answer to either of the following is "Yes", a more complete depression screening is indicated)   Q1: Over the past two weeks, have you felt down, depressed or hopeless? Yes  Q2: Over the past two weeks, have you felt little interest or pleasure in doing things? Yes  Have you lost interest or pleasure in daily life? Yes  Do you often feel hopeless? Yes  Do you cry easily over simple problems? Yes  Activities of Daily Living In your present state of health, do you have any difficulty performing the following activities?:  Driving? Yes Managing money?  No Feeding yourself? No Getting from bed to chair? No Climbing a flight of stairs? Yes Preparing food and eating?: Yes Bathing or showering? Yes Getting dressed: No Getting to the toilet? Yes Using the toilet:No Moving around from place to place: Yes In the past year have  you fallen or had a near fall?:Yes   Are you sexually active?  No  Do you have more than one partner?  No  Vision Difficulties: Yes  Hearing Difficulties: Yes Do you often ask people to speak up or repeat themselves? Yes Do you experience ringing or noises in your ears? No Do you have difficulty understanding soft or whispered voices? Yes  Cognition  Do you feel that you have a problem with memory?Yes  Do you often misplace items? No  Do you feel safe at home?  Yes  Advanced directives Does patient have a [place]? Yes Does patient have a Living Will? Yes   Objective:     Blood pressure 132/64, pulse 86, temperature 98 F (36.7 C), temperature source Temporal, resp. rate 16, height 5\' 7"  (1.702 m), weight 157 lb (71.215 kg). Body mass index is 24.58 kg/(m^2).  General appearance: alert, no distress, WD/WN, male Cognitive Testing  Alert? Yes  Normal Appearance?Yes  Oriented to person? Yes  Place? Yes   Time? Yes  Recall of three objects?  Yes  Can perform simple calculations? Yes  Displays appropriate judgment?Yes  Can read the correct time from a watch face?Yes  HEENT: normocephalic, sclerae anicteric, TMs pearly, nares patent, no discharge or erythema, pharynx normal Oral cavity: MMM, no lesions Neck: supple, no lymphadenopathy, no thyromegaly, no masses Heart: RRR, normal S1, S2 Lungs: CTA bilaterally, no wheezes, rhonchi, or rales Abdomen: +bs, soft, non tender, non distended, with suprapubic catheter Musculoskeletal: nontender, no swelling, no obvious deformity, slowly walks with heavy steps with a walker Extremities: no edema, no cyanosis, no clubbing Pulses: 2+ symmetric, upper and lower extremities, normal cap refill Neurological: alert, oriented x 3, CN2-12 intact, strength normal upper extremities and lower extremities, throughout, no cerebellar signs Psychiatric: normal affect, some lethargy, falling asleep in the chair But very pleasant  pleasant    Medicare Attestation I have personally reviewed: The patient's medical and social history Their use of alcohol, tobacco or illicit drugs Their current medications and supplements The patient's functional ability including ADLs,fall risks, home safety risks, cognitive, and hearing and visual impairment Diet and physical activities Evidence for depression or mood disorders  The patient's weight, height, BMI, and visual acuity have been recorded in the chart.  I have made referrals, counseling, and provided education to the patient based on review of the above and I have provided the patient with a  written personalized care plan for preventive services.     Vicie Mutters, PA-C   01/22/2015

## 2015-01-22 NOTE — Patient Instructions (Signed)
Switch zoloft to celexa, can do 1/2 pill at first and increase to 1 pill if needed.   Preventative Care for Adults, Male       REGULAR HEALTH EXAMS:  A routine yearly physical is a good way to check in with your primary care provider about your health and preventive screening. It is also an opportunity to share updates about your health and any concerns you have, and receive a thorough all-over exam.   Most health insurance companies pay for at least some preventative services.  Check with your health plan for specific coverages.  WHAT PREVENTATIVE SERVICES DO MEN NEED?  Adult men should have their weight and blood pressure checked regularly.   Men age 20 and older should have their cholesterol levels checked regularly.  Beginning at age 73 and continuing to age 23, men should be screened for colorectal cancer.  Certain people should may need continued testing until age 48.  Other cancer screening may include exams for testicular and prostate cancer.  Updating vaccinations is part of preventative care.  Vaccinations help protect against diseases such as the flu.  Lab tests are generally done as part of preventative care to screen for anemia and blood disorders, to screen for problems with the kidneys and liver, to screen for bladder problems, to check blood sugar, and to check your cholesterol level.  Preventative services generally include counseling about diet, exercise, avoiding tobacco, drugs, excessive alcohol consumption, and sexually transmitted infections.    GENERAL RECOMMENDATIONS FOR GOOD HEALTH:  Healthy diet:  Eat a variety of foods, including fruit, vegetables, animal or vegetable protein, such as meat, fish, chicken, and eggs, or beans, lentils, tofu, and grains, such as rice.  Drink plenty of water daily.  Decrease saturated fat in the diet, avoid lots of red meat, processed foods, sweets, fast foods, and fried foods.  Exercise:  Aerobic exercise helps maintain  good heart health. At least 30-40 minutes of moderate-intensity exercise is recommended. For example, a brisk walk that increases your heart rate and breathing. This should be done on most days of the week.   Find a type of exercise or a variety of exercises that you enjoy so that it becomes a part of your daily life.  Examples are running, walking, swimming, water aerobics, and biking.  For motivation and support, explore group exercise such as aerobic class, spin class, Zumba, Yoga,or  martial arts, etc.    Set exercise goals for yourself, such as a certain weight goal, walk or run in a race such as a 5k walk/run.  Speak to your primary care provider about exercise goals.  Disease prevention:  If you smoke or chew tobacco, find out from your caregiver how to quit. It can literally save your life, no matter how long you have been a tobacco user. If you do not use tobacco, never begin.   Maintain a healthy diet and normal weight. Increased weight leads to problems with blood pressure and diabetes.   The Body Mass Index or BMI is a way of measuring how much of your body is fat. Having a BMI above 27 increases the risk of heart disease, diabetes, hypertension, stroke and other problems related to obesity. Your caregiver can help determine your BMI and based on it develop an exercise and dietary program to help you achieve or maintain this important measurement at a healthful level.  High blood pressure causes heart and blood vessel problems.  Persistent high blood pressure should be treated with  medicine if weight loss and exercise do not work.   Fat and cholesterol leaves deposits in your arteries that can block them. This causes heart disease and vessel disease elsewhere in your body.  If your cholesterol is found to be high, or if you have heart disease or certain other medical conditions, then you may need to have your cholesterol monitored frequently and be treated with medication.   Ask if you  should have a stress test if your history suggests this. A stress test is a test done on a treadmill that looks for heart disease. This test can find disease prior to there being a problem.  Avoid drinking alcohol in excess (more than two drinks per day).  Avoid use of street drugs. Do not share needles with anyone. Ask for professional help if you need assistance or instructions on stopping the use of alcohol, cigarettes, and/or drugs.  Brush your teeth twice a day with fluoride toothpaste, and floss once a day. Good oral hygiene prevents tooth decay and gum disease. The problems can be painful, unattractive, and can cause other health problems. Visit your dentist for a routine oral and dental check up and preventive care every 6-12 months.   Look at your skin regularly.  Use a mirror to look at your back. Notify your caregivers of changes in moles, especially if there are changes in shapes, colors, a size larger than a pencil eraser, an irregular border, or development of new moles.  Safety:  Use seatbelts 100% of the time, whether driving or as a passenger.  Use safety devices such as hearing protection if you work in environments with loud noise or significant background noise.  Use safety glasses when doing any work that could send debris in to the eyes.  Use a helmet if you ride a bike or motorcycle.  Use appropriate safety gear for contact sports.  Talk to your caregiver about gun safety.  Use sunscreen with a SPF (or skin protection factor) of 15 or greater.  Lighter skinned people are at a greater risk of skin cancer. Don't forget to also wear sunglasses in order to protect your eyes from too much damaging sunlight. Damaging sunlight can accelerate cataract formation.   Practice safe sex. Use condoms. Condoms are used for birth control and to help reduce the spread of sexually transmitted infections (or STIs).  Some of the STIs are gonorrhea (the clap), chlamydia, syphilis, trichomonas,  herpes, HPV (human papilloma virus) and HIV (human immunodeficiency virus) which causes AIDS. The herpes, HIV and HPV are viral illnesses that have no cure. These can result in disability, cancer and death.   Keep carbon monoxide and smoke detectors in your home functioning at all times. Change the batteries every 6 months or use a model that plugs into the wall.   Vaccinations:  Stay up to date with your tetanus shots and other required immunizations. You should have a booster for tetanus every 10 years. Be sure to get your flu shot every year, since 5%-20% of the [place] population comes down with the flu. The flu vaccine changes each year, so being vaccinated once is not enough. Get your shot in the fall, before the flu season peaks.   Other vaccines to consider:  Pneumococcal vaccine to protect against certain types of pneumonia.  This is normally recommended for adults age 6 or older.  However, adults younger than 79 years old with certain underlying conditions such as diabetes, heart or lung disease should also receive  the vaccine.  Shingles vaccine to protect against Varicella Zoster if you are older than age 74, or younger than 79 years old with certain underlying illness.  Hepatitis A vaccine to protect against a form of infection of the liver by a virus acquired from food.  Hepatitis B vaccine to protect against a form of infection of the liver by a virus acquired from blood or body fluids, particularly if you work in health care.  If you plan to travel internationally, check with your local health department for specific vaccination recommendations.  Cancer Screening:  Most routine colon cancer screening begins at the age of 77. On a yearly basis, doctors may provide special easy to use take-home tests to check for hidden blood in the stool. Sigmoidoscopy or colonoscopy can detect the earliest forms of colon cancer and is life saving. These tests use a small camera at the end of a tube  to directly examine the colon. Speak to your caregiver about this at age 55, when routine screening begins (and is repeated every 5 years unless early forms of pre-cancerous polyps or small growths are found).   At the age of 83 men usually start screening for prostate cancer every year. Screening may begin at a younger age for those with higher risk. Those at higher risk include African-Americans or having a family history of prostate cancer. There are two types of tests for prostate cancer:   Prostate-specific antigen (PSA) testing. Recent studies raise questions about prostate cancer using PSA and you should discuss this with your caregiver.   Digital rectal exam (in which your doctor's lubricated and gloved finger feels for enlargement of the prostate through the anus).   Screening for testicular cancer.  Do a monthly exam of your testicles. Gently roll each testicle between your thumb and fingers, feeling for any abnormal lumps. The best time to do this is after a hot shower or bath when the tissues are looser. Notify your caregivers of any lumps, tenderness or changes in size or shape immediately.

## 2015-01-23 LAB — HEMOGLOBIN A1C
Hgb A1c MFr Bld: 5.8 % — ABNORMAL HIGH (ref <5.7)
Mean Plasma Glucose: 120 mg/dL — ABNORMAL HIGH (ref <117)

## 2015-01-23 LAB — TSH: TSH: 2.12 u[IU]/mL (ref 0.350–4.500)

## 2015-01-23 LAB — VITAMIN D 25 HYDROXY (VIT D DEFICIENCY, FRACTURES): Vit D, 25-Hydroxy: 62 ng/mL (ref 30–100)

## 2015-01-26 LAB — URINE CULTURE

## 2015-01-27 MED ORDER — SULFAMETHOXAZOLE-TRIMETHOPRIM 800-160 MG PO TABS: 1.0000 | ORAL_TABLET | Freq: Two times a day (BID) | ORAL | Status: AC

## 2015-01-27 NOTE — Addendum Note (Signed)
Addended by: Vicie Mutters R on: 01/27/2015 08:27 AM   Modules accepted: Orders, SmartSet

## 2015-02-04 DIAGNOSIS — C61 Malignant neoplasm of prostate: Secondary | ICD-10-CM | POA: Diagnosis not present

## 2015-02-11 DIAGNOSIS — Z8551 Personal history of malignant neoplasm of bladder: Secondary | ICD-10-CM | POA: Diagnosis not present

## 2015-02-11 DIAGNOSIS — C61 Malignant neoplasm of prostate: Secondary | ICD-10-CM | POA: Diagnosis not present

## 2015-02-11 DIAGNOSIS — R339 Retention of urine, unspecified: Secondary | ICD-10-CM | POA: Diagnosis not present

## 2015-02-26 ENCOUNTER — Other Ambulatory Visit: Payer: Medicare Other

## 2015-02-26 DIAGNOSIS — N390 Urinary tract infection, site not specified: Secondary | ICD-10-CM | POA: Diagnosis not present

## 2015-02-27 LAB — URINALYSIS, MICROSCOPIC ONLY
CRYSTALS: NONE SEEN
Casts: NONE SEEN
WBC, UA: >50 WBC/hpf — AB (ref <3)

## 2015-02-27 LAB — URINALYSIS, ROUTINE W REFLEX MICROSCOPIC
BILIRUBIN URINE: NEGATIVE
Glucose, UA: NEGATIVE mg/dL
KETONES UR: NEGATIVE mg/dL
NITRITE: POSITIVE — AB
PROTEIN: 100 mg/dL — AB
Specific Gravity, Urine: 1.016 (ref 1.005–1.030)
UROBILINOGEN UA: 0.2 mg/dL (ref 0.0–1.0)
pH: 6 (ref 5.0–8.0)

## 2015-03-02 LAB — URINE CULTURE: Colony Count: >=100000

## 2015-03-04 MED ORDER — SULFAMETHOXAZOLE-TRIMETHOPRIM 800-160 MG PO TABS: 1.0000 | ORAL_TABLET | Freq: Two times a day (BID) | ORAL | Status: DC

## 2015-03-05 ENCOUNTER — Encounter: Payer: Self-pay | Admitting: Physician Assistant

## 2015-03-13 DIAGNOSIS — N320 Bladder-neck obstruction: Secondary | ICD-10-CM | POA: Diagnosis not present

## 2015-03-13 DIAGNOSIS — N139 Obstructive and reflux uropathy, unspecified: Secondary | ICD-10-CM | POA: Diagnosis not present

## 2015-04-09 ENCOUNTER — Other Ambulatory Visit: Payer: Medicare Other

## 2015-04-09 DIAGNOSIS — N3000 Acute cystitis without hematuria: Secondary | ICD-10-CM | POA: Diagnosis not present

## 2015-04-12 LAB — URINE CULTURE

## 2015-04-12 MED ORDER — SULFAMETHOXAZOLE-TRIMETHOPRIM 800-160 MG PO TABS: 1.0000 | ORAL_TABLET | Freq: Two times a day (BID) | ORAL | Status: DC

## 2015-04-19 ENCOUNTER — Other Ambulatory Visit: Payer: Self-pay | Admitting: Physician Assistant

## 2015-04-19 MED ORDER — LORAZEPAM 1 MG PO TABS: ORAL_TABLET | ORAL | Status: DC

## 2015-05-07 DIAGNOSIS — R339 Retention of urine, unspecified: Secondary | ICD-10-CM | POA: Diagnosis not present

## 2015-05-08 ENCOUNTER — Ambulatory Visit (INDEPENDENT_AMBULATORY_CARE_PROVIDER_SITE_OTHER): Payer: Medicare Other | Admitting: Internal Medicine

## 2015-05-08 ENCOUNTER — Encounter: Payer: Self-pay | Admitting: Internal Medicine

## 2015-05-08 VITALS — BP 126/52 | HR 84 | Temp 97.3°F | Resp 16 | Ht 69.0 in | Wt 152.4 lb

## 2015-05-08 DIAGNOSIS — R7309 Other abnormal glucose: Secondary | ICD-10-CM | POA: Diagnosis not present

## 2015-05-08 DIAGNOSIS — I10 Essential (primary) hypertension: Secondary | ICD-10-CM | POA: Diagnosis not present

## 2015-05-08 DIAGNOSIS — E559 Vitamin D deficiency, unspecified: Secondary | ICD-10-CM | POA: Diagnosis not present

## 2015-05-08 DIAGNOSIS — I480 Paroxysmal atrial fibrillation: Secondary | ICD-10-CM | POA: Diagnosis not present

## 2015-05-08 DIAGNOSIS — N3090 Cystitis, unspecified without hematuria: Secondary | ICD-10-CM | POA: Diagnosis not present

## 2015-05-08 DIAGNOSIS — R7303 Prediabetes: Secondary | ICD-10-CM

## 2015-05-08 DIAGNOSIS — Z79899 Other long term (current) drug therapy: Secondary | ICD-10-CM

## 2015-05-08 NOTE — Progress Notes (Signed)
Patient ID: Brent Day., male   DOB: 01-18-1927, 81 y.o.   MRN: [id number]   This very nice 81 y.o. WWM presents for 3 month follow up with Hypertension, HLD, Pre-DM, hx/o bladder/Prostate Ca with suprapubic & indwelling Foley caths  and Vitamin D Deficiency. Patient is followed by Dr Jasmine December and in addition has hx/o MRSA UTI vs colonization.    Patient is treated for HTN since 2010 & BP has been controlled at home. Today's BP: (!) 126/52 mmHg. Patient has had no complaints of any cardiac type chest pain, palpitations, dyspnea/orthopnea/PND, dizziness, claudication, or dependent edema.   Hyperlipidemia is controlled with diet & meds. Patient denies myalgias or other med SE's. Last Lipids were at goal - Total Cholesterol,  151; HDL 47; LDL (calc) 81; Trig 116 on 01/22/2015.   Also, the patient has history of PreDiabetes - also since 2010  and has had no symptoms of reactive hypoglycemia, diabetic polys, paresthesias or visual blurring.  Last A1c was  5.8% on 01/22/2015.    Further, the patient also has history of Vitamin D Deficiency and supplements vitamin D without any suspected side-effects. Last vitamin D was 62 on  01/22/2015.  Medication Sig  . b complex vitamins tablet Take 1 tablet by mouth daily.  . Cholecalciferol (VITAMIN D3) 2000 UNITS capsule Take 4,000 Units by mouth daily.   . citalopram (CELEXA) 20 MG tablet Take 1 tablet (20 mg total) by mouth daily.  Marland Kitchen docusate sodium (COLACE) 100 MG capsule Take 200 mg by mouth 2 (two) times daily.  . Ensure Plus (ENSURE PLUS) LIQD Take 237 mLs by mouth 2 (two) times daily between meals.  Marland Kitchen LORazepam (ATIVAN) 1 MG tablet 1/2 to 1 tablet as needed for anxiety 3 times a day  . mirtazapine (REMERON) 30 MG tablet Take 15 mg by mouth at bedtime.   . Probiotic Product (PROBIOTIC DAILY PO) Take 1 capsule by mouth daily.   Marland Kitchen triamcinolone cream (KENALOG) 0.1 % Apply 1 application topically 2 (two) times daily.  . clotrimazole-betamethasone  (LOTRISONE) cream Apply 1 application topically 2 (two) times daily.  Marland Kitchen ketoconazole (NIZORAL) 2 % cream   . loratadine (CLARITIN) 10 MG tablet Take 10 mg by mouth daily.  . DS,SEPTRA DS 800-160 MG per tablet Take 1 tablet by mouth 2 (two) times daily.   No Known Allergies  PMHx:   Past Medical History  Diagnosis Date  . Pancreatitis   . Fractured hip 09/2011  . Prostate tumor     to have bx by Dr. Jasmine December  . UTI (lower urinary tract infection) 06/13/2012    has had x 3 months  . Falls 06/13/12    hit head approx. 1 month ago  . Unsteady gait 06/13/2012    "bad for awhile but getting worse recently"  . Slurred speech     slurred speech on 06/12/12 - didn't last long, slower talking recently  . Cancer     bladder  . Lung abnormality     spot on ct approx 3 months ago - to be followed - [facility]  . 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 Nose fracture     after a fall - Oct. 2012  . Hypertension   . Dysrhythmia     history of a-fib  - followed by Dr. Lovena Le   . Osteopenia   . Fractures, stress     spine  . 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 Hallucinations     will hospitalized -  . Foley catheter in place   . Anxiety   . Ulcer of ankle     rt  . Atrial fibrillation   . Urinary retention 07/04/2014   Immunization History  Administered Date(s) Administered  . Influenza, High Dose Seasonal PF 10/05/2013  . Influenza-Unspecified 10/21/2012, 09/26/2014  . Pneumococcal-Unspecified 10/05/2011  . Td 08/15/2013  . Zoster 01/19/2012   Past Surgical History  Procedure Laterality Date  . Cholecystectomy    . Partial hip arthroplasty    . Cystoscopy      has yearly due to bladder cancer  . Retinal detachment surgery      at least 3 and have been in both eyes  . Transurethral resection of bladder tumor    . Transurethral resection of prostate  01/18/2013    Procedure: TRANSURETHRAL RESECTION OF THE PROSTATE WITH GYRUS  INSTRUMENTS;  Surgeon: Molli Hazard, MD;  Location: [facility];  Service: Urology;  Laterality: N/A;  CYSTOSCOPY, SUPRAPUBIC TUBE PLACEMENT, CHANNEL TURP, RECTAL EXAM    . Insertion of suprapubic catheter  01/18/2013    Procedure: INSERTION OF SUPRAPUBIC CATHETER;  Surgeon: Molli Hazard, MD;  Location: [facility];  Service: Urology;  Laterality: N/A;  . Cystoscopy  01/18/2013    Procedure: CYSTOSCOPY;  Surgeon: Molli Hazard, MD;  Location: [facility];  Service: Urology;  Laterality: N/A;   FHx:    Reviewed / unchanged  SHx:    Reviewed / unchanged  Systems Review:  Constitutional: Denies fever, chills, wt changes, headaches, insomnia, fatigue, night sweats, change in appetite. Eyes: Denies redness, blurred vision, diplopia, discharge, itchy, watery eyes.  ENT: Denies discharge, congestion, post nasal drip, epistaxis, sore throat, earache, hearing loss, dental pain, tinnitus, vertigo, sinus pain, snoring.  CV: Denies chest pain, palpitations, irregular heartbeat, syncope, dyspnea, diaphoresis, orthopnea, PND, claudication or edema. Respiratory: denies cough, dyspnea, DOE, pleurisy, hoarseness, laryngitis, wheezing.  Gastrointestinal: Denies dysphagia, odynophagia, heartburn, reflux, water brash, abdominal pain or cramps, nausea, vomiting, bloating, diarrhea, constipation, hematemesis, melena, hematochezia  or hemorrhoids. Musculoskeletal: Denies arthralgias, myalgias, stiffness, jt. swelling, pain, limping or strain/sprain.  Skin: Denies pruritus, rash, hives, warts, acne, eczema or change in skin lesion(s). Neuro: No weakness, tremor, incoordination, spasms, paresthesia or pain. Psychiatric: Denies confusion, memory loss or sensory loss. Endo: Denies change in weight, skin or hair change.  Heme/Lymph: No excessive bleeding, bruising or enlarged lymph nodes.  Physical Exam  BP 126/52   Pulse 84  Temp  97.3 F   Resp 16  Ht 5\' 9"    Wt 152 lb 6.4 oz     BMI 22.50    Appears thin poorly nourished and chronically ill and in no acute distress.Smiles spontaneously.  Eyes: PERRLA, EOMs, conjunctiva no swelling or erythema. Sinuses: No frontal/maxillary tenderness ENT/Mouth: EAC's clear, TM's nl w/o erythema, bulging. Nares clear w/o erythema, swelling, exudates. Oropharynx clear without erythema or exudates. Oral hygiene is good. Tongue normal, non obstructing. Hearing intact.  Neck: Supple. Thyroid nl. Car 2+/2+ without bruits, nodes or JVD. Chest: Respirations nl with BS clear & equal w/o rales, rhonchi, wheezing or stridor.  Cor: Heart sounds normal w/ regular rate and rhythm without sig. murmurs, gallops, clicks, or rubs. Peripheral pulses normal and equal  without edema.  Abdomen: Soft & bowel sounds normal. Non-tender w/o guarding, rebound, hernias, masses, or organomegaly.  Lymphatics: Unremarkable.  Musculoskeletal: Full ROM all peripheral extremities, joint stability, 5/5 strength, and normal gait.  Skin: Warm, dry without exposed rashes, lesions or ecchymosis apparent.  Neuro: Cranial nerves intact, reflexes equal bilaterally. Sensory-motor testing grossly intact. Tendon reflexes grossly intact.  Pysch: Alert & oriented x 3.  Insight and judgement nl & appropriate. No ideations.  Assessment and Plan:  1. Essential hypertension  - TSH  2. Prediabetes  - Hemoglobin A1c - Insulin, random  3. Vitamin D deficiency  - Vit D  25 hydroxy (rtn osteoporosis monitoring)  4. Paroxysmal atrial fibrillation   5. Cystitis  - Urine Microscopic - Urine culture  6. Medication management  - CBC with Differential/Platelet - BASIC METABOLIC PANEL WITH GFR - Hepatic function panel - Magnesium   Recommended regular exercise, BP monitoring, weight control, and discussed med and SE's. Recommended labs to assess and monitor clinical status. Further disposition pending results of labs. Over 30 minutes of exam, counseling, chart review was  performed

## 2015-05-09 LAB — HEPATIC FUNCTION PANEL
ALK PHOS: 84 U/L (ref 39–117)
ALT: 11 U/L (ref 0–53)
AST: 21 U/L (ref 0–37)
Albumin: 3.5 g/dL (ref 3.5–5.2)
BILIRUBIN INDIRECT: 0.3 mg/dL (ref 0.2–1.2)
BILIRUBIN TOTAL: 0.4 mg/dL (ref 0.2–1.2)
Bilirubin, Direct: 0.1 mg/dL (ref 0.0–0.3)
Total Protein: 7.5 g/dL (ref 6.0–8.3)

## 2015-05-09 LAB — BASIC METABOLIC PANEL WITH GFR
BUN: 23 mg/dL (ref 6–23)
CALCIUM: 9.1 mg/dL (ref 8.4–10.5)
CO2: 23 mEq/L (ref 19–32)
Chloride: 103 mEq/L (ref 96–112)
Creat: 1.07 mg/dL (ref 0.50–1.35)
GFR, EST NON AFRICAN AMERICAN: 63 mL/min
GFR, Est African American: 72 mL/min
GLUCOSE: 85 mg/dL (ref 70–99)
Potassium: 4.6 mEq/L (ref 3.5–5.3)
Sodium: 138 mEq/L (ref 135–145)

## 2015-05-09 LAB — CBC WITH DIFFERENTIAL/PLATELET
Basophils Absolute: 0.1 10*3/uL (ref 0.0–0.1)
Basophils Relative: 1 % (ref 0–1)
EOS ABS: 0.6 10*3/uL (ref 0.0–0.7)
Eosinophils Relative: 6 % — ABNORMAL HIGH (ref 0–5)
HCT: 40.6 % (ref 39.0–52.0)
Hemoglobin: 13.2 g/dL (ref 13.0–17.0)
Lymphocytes Relative: 26 % (ref 12–46)
Lymphs Abs: 2.5 10*3/uL (ref 0.7–4.0)
MCH: 30.1 pg (ref 26.0–34.0)
MCHC: 32.5 g/dL (ref 30.0–36.0)
MCV: 92.5 fL (ref 78.0–100.0)
MONOS PCT: 7 % (ref 3–12)
MPV: 9.3 fL (ref 8.6–12.4)
Monocytes Absolute: 0.7 10*3/uL (ref 0.1–1.0)
Neutro Abs: 5.8 10*3/uL (ref 1.7–7.7)
Neutrophils Relative %: 60 % (ref 43–77)
Platelets: 306 10*3/uL (ref 150–400)
RBC: 4.39 MIL/uL (ref 4.22–5.81)
RDW: 14.9 % (ref 11.5–15.5)
WBC: 9.6 10*3/uL (ref 4.0–10.5)

## 2015-05-09 LAB — TSH: TSH: 2.343 u[IU]/mL (ref 0.350–4.500)

## 2015-05-09 LAB — URINALYSIS, MICROSCOPIC ONLY
BACTERIA UA: NONE SEEN
Casts: NONE SEEN
Crystals: NONE SEEN
Squamous Epithelial / LPF: NONE SEEN

## 2015-05-09 LAB — HEMOGLOBIN A1C
Hgb A1c MFr Bld: 5.9 % — ABNORMAL HIGH (ref <5.7)
Mean Plasma Glucose: 123 mg/dL — ABNORMAL HIGH (ref <117)

## 2015-05-09 LAB — VITAMIN D 25 HYDROXY (VIT D DEFICIENCY, FRACTURES): Vit D, 25-Hydroxy: 56 ng/mL (ref 30–100)

## 2015-05-09 LAB — INSULIN, RANDOM: INSULIN: 6.9 u[IU]/mL (ref 2.0–19.6)

## 2015-05-09 LAB — MAGNESIUM: Magnesium: 2.1 mg/dL (ref 1.5–2.5)

## 2015-05-11 ENCOUNTER — Other Ambulatory Visit: Payer: Self-pay | Admitting: Internal Medicine

## 2015-05-11 LAB — URINE CULTURE: Colony Count: 60000

## 2015-05-13 ENCOUNTER — Encounter: Payer: Self-pay | Admitting: Internal Medicine

## 2015-05-14 DIAGNOSIS — D090 Carcinoma in situ of bladder: Secondary | ICD-10-CM

## 2015-05-14 DIAGNOSIS — M353 Polymyalgia rheumatica: Secondary | ICD-10-CM

## 2015-05-17 DIAGNOSIS — D090 Carcinoma in situ of bladder: Secondary | ICD-10-CM | POA: Diagnosis not present

## 2015-05-17 DIAGNOSIS — M353 Polymyalgia rheumatica: Secondary | ICD-10-CM | POA: Diagnosis not present

## 2015-05-18 ENCOUNTER — Other Ambulatory Visit: Payer: Self-pay | Admitting: Physician Assistant

## 2015-05-18 DIAGNOSIS — F411 Generalized anxiety disorder: Secondary | ICD-10-CM

## 2015-05-23 ENCOUNTER — Telehealth: Payer: Self-pay | Admitting: *Deleted

## 2015-05-23 DIAGNOSIS — D090 Carcinoma in situ of bladder: Secondary | ICD-10-CM | POA: Diagnosis not present

## 2015-05-23 DIAGNOSIS — M353 Polymyalgia rheumatica: Secondary | ICD-10-CM | POA: Diagnosis not present

## 2015-05-23 NOTE — Telephone Encounter (Signed)
Heather from [place] called to request order for catheter care for patient.  OK per Dr Melford Aase.

## 2015-05-28 DIAGNOSIS — M353 Polymyalgia rheumatica: Secondary | ICD-10-CM | POA: Diagnosis not present

## 2015-05-28 DIAGNOSIS — D090 Carcinoma in situ of bladder: Secondary | ICD-10-CM | POA: Diagnosis not present

## 2015-05-29 ENCOUNTER — Other Ambulatory Visit: Payer: Self-pay | Admitting: Internal Medicine

## 2015-05-30 DIAGNOSIS — M353 Polymyalgia rheumatica: Secondary | ICD-10-CM | POA: Diagnosis not present

## 2015-05-30 DIAGNOSIS — D090 Carcinoma in situ of bladder: Secondary | ICD-10-CM | POA: Diagnosis not present

## 2015-06-03 ENCOUNTER — Ambulatory Visit (INDEPENDENT_AMBULATORY_CARE_PROVIDER_SITE_OTHER): Payer: Medicare Other | Admitting: Internal Medicine

## 2015-06-03 ENCOUNTER — Encounter: Payer: Self-pay | Admitting: Internal Medicine

## 2015-06-03 VITALS — BP 138/70 | HR 100 | Temp 98.2°F | Resp 16 | Ht 69.0 in | Wt 151.0 lb

## 2015-06-03 DIAGNOSIS — S91302A Unspecified open wound, left foot, initial encounter: Secondary | ICD-10-CM

## 2015-06-03 DIAGNOSIS — I872 Venous insufficiency (chronic) (peripheral): Secondary | ICD-10-CM | POA: Diagnosis not present

## 2015-06-03 DIAGNOSIS — S51802A Unspecified open wound of left forearm, initial encounter: Secondary | ICD-10-CM

## 2015-06-03 MED ORDER — DOXYCYCLINE HYCLATE 100 MG PO CAPS: 100.0000 mg | ORAL_CAPSULE | Freq: Two times a day (BID) | ORAL | Status: DC

## 2015-06-03 MED ORDER — MUPIROCIN CALCIUM 2 % EX CREA: TOPICAL_CREAM | CUTANEOUS | Status: DC

## 2015-06-03 NOTE — Patient Instructions (Signed)
Irrigate wounds with sterile saline once daily and place non-adherent bandages over wounds.  When you are sitting quietly you may take the bandages off and let them air out.  Please clean them with iodine as needed.  You may use bactroban once daily on the wounds.  Please take Doxycycline until it is all the way gone.  We will see you back in 1 week to recheck your wounds.     Varicose Veins Varicose veins are veins that have become enlarged and twisted. CAUSES This condition is the result of valves in the veins not working properly. Valves in the veins help return blood from the leg to the heart. When your calf muscles squeeze, the blood moves up your leg then the valves close and this continues until the blood gets back to your heart.  If these valves are damaged, blood flows backwards and backs up into the veins in the leg near the skin OR if your are sitting/standing for a long time without using your calf muscles the blood will back up into the veins in your legs. This causes the veins to become larger. People who are on their feet a lot, sit a lot without walking (like on a plane, at a desk, or in a car), who are pregnant, or who are overweight are more likely to develop varicose veins. SYMPTOMS   Bulging, twisted-appearing, bluish veins, most commonly found on the legs.  Leg pain or a feeling of heaviness. These symptoms may be worse at the end of the day.  Leg swelling.  Skin color changes. DIAGNOSIS  Varicose veins can usually be diagnosed with an exam of your legs by your caregiver. He or she may recommend an ultrasound of your leg veins. TREATMENT  Most varicose veins can be treated at home.However, other treatments are available for people who have persistent symptoms or who want to treat the cosmetic appearance of the varicose veins. But this is only cosmetic and they will return if not properly treated. These include:  Laser treatment of very small varicose veins.  Medicine that  is shot (injected) into the vein. This medicine hardens the walls of the vein and closes off the vein. This treatment is called sclerotherapy. Afterwards, you may need to wear clothing or bandages that apply pressure.  Surgery. HOME CARE INSTRUCTIONS   Do not stand or sit in one position for long periods of time. Do not sit with your legs crossed. Rest with your legs raised during the day.  Your legs have to be higher than your heart so that gravity will force the valves to open, so please really elevate your legs.   Wear elastic stockings or support hose. Do not wear other tight, encircling garments around the legs, pelvis, or waist.  ELASTIC THERAPY  has a wide variety of well priced compression stockings. [place], [place] [id number] [id number] [place] ARE COPPER INFUSED COMPRESSION SOCKS AT [facility] OR [facility]  Walk as much as possible to increase blood flow.  Raise the foot of your bed at night with 2-inch blocks.  If you get a cut in the skin over the vein and the vein bleeds, lie down with your leg raised and press on it with a clean cloth until the bleeding stops. Then place a bandage (dressing) on the cut. See your caregiver if it continues to bleed or needs stitches. SEEK MEDICAL CARE IF:   The skin around your ankle starts to break down.  You have pain, redness, tenderness, or hard swelling developing in your leg over a vein.  You are uncomfortable due to leg pain. Document Released: 09/16/2005 Document Revised: 02/29/2012 Document Reviewed: 02/02/2011 [facility] Patient Information 2014 [place].  Skin Ulcer A skin ulcer is an open sore that can be shallow or deep. Skin ulcers sometimes become infected and are difficult to treat. It may be 1 month or longer before real healing progress is made. CAUSES   Injury.  Problems with the veins or arteries.  Diabetes.  Insect bites.  Bedsores.  Inflammatory conditions. SYMPTOMS   Pain, redness,  swelling, and tenderness around the ulcer.  Fever.  Bleeding from the ulcer.  Yellow or clear fluid coming from the ulcer. DIAGNOSIS  There are many types of skin ulcers. Any open sores will be examined. Certain tests will be done to determine the kind of ulcer you have. The right treatment depends on the type of ulcer you have. TREATMENT  Treatment is a long-term challenge. It may include:  Wearing an elastic wrap, compression stockings, or gel cast over the ulcer area.  Taking antibiotic medicines or putting antibiotic creams on the affected area if there is an infection. HOME CARE INSTRUCTIONS  Put on your bandages (dressings), wraps, or casts over the ulcer as directed by your caregiver.  Change all dressings as directed by your caregiver.  Take all medicines as directed by your caregiver.  Keep the affected area clean and dry.  Avoid injuries to the affected area.  Eat a well-balanced, healthy diet that includes plenty of fruit and vegetables.  If you smoke, consider quitting or decreasing the amount of cigarettes you smoke.  Once the ulcer heals, get regular exercise as directed by your caregiver.  Work with your caregiver to make sure your blood pressure, cholesterol, and diabetes are well-controlled.  Keep your skin moisturized. Dry skin can crack and lead to skin ulcers. SEEK IMMEDIATE MEDICAL CARE IF:   Your pain gets worse.  You have swelling, redness, or fluids around the ulcer.  You have chills.  You have a fever. MAKE SURE YOU:   Understand these instructions.  Will watch your condition.  Will get help right away if you are not doing well or get worse. Document Released: 01/14/2005 Document Revised: 02/29/2012 Document Reviewed: 07/24/2011 [facility] Patient Information 2015 [place], [place]. This information is not intended to replace advice given to you by your health care provider. Make sure you discuss any questions you have with your health care  provider.  Deep Skin Avulsion A deep skin avulsion is when all layers of the skin or parts of body structures have been torn away. This is usually a result of severe injury (trauma). A deep skin avulsion can include damage to important structures beneath the skin such as tendons, ligaments, nerves, or blood vessels.  CAUSES  Many injuries can lead to a deep skin avulsion. These include:   Crush injuries.  Bites.  Falls against jagged surfaces.  Gunshot wounds.  Severe burns and injuries involving dragging (such as those from a bicycle or motorcycle accident). TREATMENT   If the wound is small and there is no damage to vital structures like nerves and blood vessels, the damaged tissues may be removed. Then, the wound can be cleaned thoroughly and closed.  A skin graft may be performed. This is a procedure in which the outer layer of skin is removed from a different part of your body. That skin (skin graft) is used to  cover the open wound. This can happen after damaged tissue is removed and repairs are completed.  Your caregiver may onlyapply a bandage (dressing) to the wound. The wound will be kept clean and allowed to heal. Healing can take weeks or months and usually leaves a large scar. This type of treatment is only done if your caregiver feels that skin grafting or a similar procedure would not work. You might need a tetanus shot if:  You cannot remember when you had your last tetanus shot.  You have never had a tetanus shot.  The injury broke your skin. If you got a tetanus shot, your arm may swell, get red, and feel warm to the touch. This is common and not a problem. If you need a tetanus shot and you choose not to have one, there is a rare chance of getting tetanus. Sickness from tetanus can be serious. HOME CARE INSTRUCTIONS   Only take over-the-counter or prescription medicines for pain, discomfort, or fever as directed by your caregiver.  Gently wash the area with mild  soap and water 2 times a day, or as directed. Rinse off the soap. Pat the area dry with a clean towel. Do not rub the wound. This may cause bleeding.  Follow your caregiver's instructions for how often you need to change the dressing.  Apply ointment and a dressing to the wound as directed.  If the dressing sticks, moisten it with soapy water and gently remove it.  Change the bandage right away if it becomes wet, dirty, or starts to smell bad.  Take showers. Do not take tub baths, swim, or do anything that may soak the wound until it is healed.  Use anti-itch medicine as directed by your caregiver. The wound may itch when it is healing. Do not pick or scratch at the wound.  Follow up with your caregiver for stitches (sutures), staple, or skin adhesive strip removal. SEEK MEDICAL CARE IF:   You have redness, swelling, or increasing pain in your wound.  A red streak or line extends away from the wound.  You have pus coming from the wound.  You notice a bad smell coming from thewound or dressing.  The wound breaks open (edges not staying together) after sutures have been removed.  You notice something coming out of the wound, such as a small piece of wood, glass, or metal.  You are unable to properly move a finger or toe if the wound is on your hand or foot.  You have severe swelling around the wound that causes pain and numbness.  Your arm, hand, leg, or foot changes color. SEEK IMMEDIATE MEDICAL CARE IF:   Your pain becomes severe or is not adequately relieved with pain medicine.  You have a fever.  You have nausea and vomiting for more than 24 hours.  You feel lightheaded, weak, or faint.  You develop chest pain or difficulty breathing. MAKE SURE YOU:   Understand these instructions.  Will watch your condition.  Will get help right away if you are not doing well or get worse. Document Released: 02/02/2007 Document Revised: 02/29/2012 Document Reviewed:  04/12/2011 [facility] Patient Information 2015 [place], [place]. This information is not intended to replace advice given to you by your health care provider. Make sure you discuss any questions you have with your health care provider.

## 2015-06-03 NOTE — Progress Notes (Signed)
   Subjective:    Patient ID: Brent Day., male    DOB: 1927/05/20, 81 y.o.   MRN: [id number]  Wound Check  Patient presents to the office for evaluation of wound check.  He reports that he scraped his arm approximately 1 week ago.  He reports that his skin was doing all right and his home health nurse has been taking care of bandaging it.  He has felt that since that time it has not been healing well.  He reports that otherwise he has been doing okay. He is here with his daughter today and she reports that he has had some more significant leg swelling and ruborous color to his lower legs.      Review of Systems  Constitutional: Negative for fever, chills and fatigue.  HENT: Negative for congestion, postnasal drip, rhinorrhea and sneezing.   Respiratory: Negative for chest tightness and shortness of breath.   Cardiovascular: Positive for leg swelling. Negative for chest pain and palpitations.  Gastrointestinal: Negative for nausea and vomiting.  Skin: Positive for wound.  All other systems reviewed and are negative.      Objective:   Physical Exam  Constitutional: He is oriented to person, place, and time. He appears well-developed and well-nourished. No distress.  HENT:  Head: Normocephalic and atraumatic.  Mouth/Throat: Oropharynx is clear and moist. No oropharyngeal exudate.  Eyes: Conjunctivae are normal. No scleral icterus.  Neck: Normal range of motion. Neck supple. No JVD present. No thyromegaly present.  Cardiovascular: Normal rate, regular rhythm, normal heart sounds and intact distal pulses.  Exam reveals no gallop and no friction rub.   No murmur heard. Pulmonary/Chest: Effort normal and breath sounds normal. No respiratory distress. He has no wheezes. He has no rales. He exhibits no tenderness.  Musculoskeletal: Normal range of motion.  Lymphadenopathy:    He has no cervical adenopathy.  Neurological: He is alert and oriented to person, place, and time.  Skin:  Skin is warm and dry. He is not diaphoretic.     Psychiatric: He has a normal mood and affect. His behavior is normal. Judgment and thought content normal.  Nursing note and vitals reviewed.         Assessment & Plan:    1. Wound, open, arm, forearm, left, initial encounter -wound recheck in 1 week -patients' daughter to call if systemic symptoms which we discussed -keep wounds clean and dry - doxycycline (VIBRAMYCIN) 100 MG capsule; Take 1 capsule (100 mg total) by mouth 2 (two) times daily. One po bid x 7 days  Dispense: 14 capsule; Refill: 0 - mupirocin cream (BACTROBAN) 2 %; Apply to affected area 1 times daily  Dispense: 30 g; Refill: 0  2. Chronic venous insufficiency -elevate feet -compression stockings -avoid salt -if no better at 1 week recheck consider low dose fluid pill  3. Wound, open, foot, left, initial encounter  - doxycycline (VIBRAMYCIN) 100 MG capsule; Take 1 capsule (100 mg total) by mouth 2 (two) times daily. One po bid x 7 days  Dispense: 14 capsule; Refill: 0 - mupirocin cream (BACTROBAN) 2 %; Apply to affected area 1 times daily  Dispense: 30 g; Refill: 0

## 2015-06-04 DIAGNOSIS — R339 Retention of urine, unspecified: Secondary | ICD-10-CM | POA: Diagnosis not present

## 2015-06-13 ENCOUNTER — Encounter: Payer: Self-pay | Admitting: Internal Medicine

## 2015-06-13 ENCOUNTER — Ambulatory Visit (INDEPENDENT_AMBULATORY_CARE_PROVIDER_SITE_OTHER): Payer: Medicare Other | Admitting: Internal Medicine

## 2015-06-13 VITALS — BP 122/64 | HR 78 | Temp 97.8°F | Resp 16 | Ht 69.0 in | Wt 151.0 lb

## 2015-06-13 DIAGNOSIS — T148XXA Other injury of unspecified body region, initial encounter: Secondary | ICD-10-CM

## 2015-06-13 DIAGNOSIS — L97521 Non-pressure chronic ulcer of other part of left foot limited to breakdown of skin: Secondary | ICD-10-CM

## 2015-06-13 DIAGNOSIS — T148 Other injury of unspecified body region: Secondary | ICD-10-CM | POA: Diagnosis not present

## 2015-06-13 DIAGNOSIS — N390 Urinary tract infection, site not specified: Secondary | ICD-10-CM | POA: Diagnosis not present

## 2015-06-13 DIAGNOSIS — T83511S Infection and inflammatory reaction due to indwelling urethral catheter, sequela: Secondary | ICD-10-CM

## 2015-06-13 DIAGNOSIS — T8351XS Infection and inflammatory reaction due to indwelling urinary catheter, sequela: Secondary | ICD-10-CM | POA: Diagnosis not present

## 2015-06-13 NOTE — Patient Instructions (Signed)
Stasis Ulcer ° A stasis ulcer is a sore on the skin. It occurs in the legs when your blood flow is damaged.  °HOME CARE °· Do not stand or sit in one position for a long time. Do not sit with your legs crossed. Raise (elevate) your legs. °· Wear elastic stockings (compression stockings). Do not wear tight clothing around the legs or waist area. Use and apply bandages (dressings) as told. °· Walk as much as possible. If you take long car or plane rides, take a break to walk around every 2 hours. °· Only take medicine as told by your doctor. °· Raise the end of your bed with 2-inch blocks only if your doctor says it is okay. °· If you cut the skin on your leg, lie down and raise your leg. Gently clean the area with a clean cloth. Then, put pressure on the cut until the bleeding stops. Put a bandage on. °· Keep all doctor visits. °GET HELP RIGHT AWAY IF: °· The ulcer area starts to break down. °· You have pain, redness, or tenderness in or around the ulcer. °· You have yellowish-white fluid (pus) or hard puffiness (swelling) in or around the ulcer. °· Your pain gets worse. °· You get a fever. °· You have chest pain or shortness of breath. °MAKE SURE YOU: °· Understand these instructions. °· Will watch your condition. °· Will get help right away if you are not doing well or get worse. °Document Released: 01/14/2005 Document Revised: 04/03/2013 Document Reviewed: 04/07/2011 °[employer]® Patient Information ©2015 [employer]. This information is not intended to replace advice given to you by your health care provider. Make sure you discuss any questions you have with your health care provider. ° °

## 2015-06-13 NOTE — Progress Notes (Signed)
   Subjective:    Patient ID: Brent Day., male    DOB: 1927-08-11, 81 y.o.   MRN: [id number]  Wound Check  Patient presents to the office for recheck of left arm wound and left foot wound.  They finished the doxycycline and they are using the bactroban as needed on his foot.  His daughter has continued to use bactroban and irrigated his wound with sterile saline.  They feel like it has gotten a lot better since their last visit.    His daughter would like to recheck his urine today as they are concerned that the MRSA is not cleared from his urine.     Review of Systems  Constitutional: Negative for fever, chills and fatigue.  Respiratory: Negative for chest tightness and shortness of breath.   Cardiovascular: Positive for leg swelling. Negative for chest pain and palpitations.  Gastrointestinal: Negative for nausea and vomiting.  Skin: Positive for color change and wound.  Neurological: Negative for dizziness, weakness and headaches.       Objective:   Physical Exam  Constitutional: He is oriented to person, place, and time. He appears well-developed and well-nourished. No distress.  HENT:  Head: Normocephalic and atraumatic.  Mouth/Throat: Oropharynx is clear and moist. No oropharyngeal exudate.  Eyes: Conjunctivae are normal. No scleral icterus.  Neck: Normal range of motion. Neck supple. No JVD present. No thyromegaly present.  Cardiovascular: Normal rate, regular rhythm and intact distal pulses.  Exam reveals no gallop and no friction rub.   Murmur heard. Pulses:      Dorsalis pedis pulses are 1+ on the right side, and 1+ on the left side.       Posterior tibial pulses are 1+ on the right side, and 1+ on the left side.  Chronic venous stasis changes to the bilateral legs.  NO swelling of the legs, but rubour on dependency.    Pulmonary/Chest: Effort normal and breath sounds normal. No respiratory distress. He has no wheezes. He has no rales. He exhibits no tenderness.    Musculoskeletal: Normal range of motion.  Lymphadenopathy:    He has no cervical adenopathy.  Neurological: He is alert and oriented to person, place, and time.  Skin: Skin is warm and dry. He is not diaphoretic.     Psychiatric: He has a normal mood and affect. His behavior is normal. Judgment and thought content normal.  Nursing note and vitals reviewed.         Assessment & Plan:    1. Foot ulcer, left, limited to breakdown of skin -elevate feet -use slurry of betadine and sugar - AMB referral to wound care center  2. Skin avulsion -cont wound care -well healing.  NO issues noted -doxycycline completed   3. Urinary tract infection associated with catheterization of urinary tract, sequela  - Urinalysis, Reflex Microscopic - Culture, Urine

## 2015-06-14 LAB — URINALYSIS, ROUTINE W REFLEX MICROSCOPIC
Bilirubin Urine: NEGATIVE
Glucose, UA: NEGATIVE mg/dL
Hgb urine dipstick: NEGATIVE
Ketones, ur: NEGATIVE mg/dL
NITRITE: NEGATIVE
Protein, ur: NEGATIVE mg/dL
Specific Gravity, Urine: 1.013 (ref 1.005–1.030)
UROBILINOGEN UA: 0.2 mg/dL (ref 0.0–1.0)
pH: 6.5 (ref 5.0–8.0)

## 2015-06-14 LAB — URINALYSIS, MICROSCOPIC ONLY
BACTERIA UA: NONE SEEN
CRYSTALS: NONE SEEN
Casts: NONE SEEN

## 2015-06-15 LAB — URINE CULTURE
Colony Count: NO GROWTH
ORGANISM ID, BACTERIA: NO GROWTH

## 2015-06-17 ENCOUNTER — Other Ambulatory Visit: Payer: Self-pay

## 2015-06-17 ENCOUNTER — Encounter: Payer: Self-pay | Admitting: Internal Medicine

## 2015-06-25 ENCOUNTER — Encounter: Payer: Self-pay | Admitting: Internal Medicine

## 2015-06-26 ENCOUNTER — Encounter: Payer: Self-pay | Admitting: Internal Medicine

## 2015-07-04 ENCOUNTER — Encounter (HOSPITAL_BASED_OUTPATIENT_CLINIC_OR_DEPARTMENT_OTHER): Payer: Medicare Other | Attending: Internal Medicine

## 2015-07-04 DIAGNOSIS — I10 Essential (primary) hypertension: Secondary | ICD-10-CM | POA: Diagnosis not present

## 2015-07-04 DIAGNOSIS — Z9221 Personal history of antineoplastic chemotherapy: Secondary | ICD-10-CM | POA: Insufficient documentation

## 2015-07-04 DIAGNOSIS — I739 Peripheral vascular disease, unspecified: Secondary | ICD-10-CM | POA: Diagnosis not present

## 2015-07-04 DIAGNOSIS — L97521 Non-pressure chronic ulcer of other part of left foot limited to breakdown of skin: Secondary | ICD-10-CM | POA: Diagnosis not present

## 2015-07-04 DIAGNOSIS — F17200 Nicotine dependence, unspecified, uncomplicated: Secondary | ICD-10-CM | POA: Diagnosis not present

## 2015-07-04 DIAGNOSIS — J449 Chronic obstructive pulmonary disease, unspecified: Secondary | ICD-10-CM | POA: Insufficient documentation

## 2015-07-09 DIAGNOSIS — R339 Retention of urine, unspecified: Secondary | ICD-10-CM | POA: Diagnosis not present

## 2015-07-11 ENCOUNTER — Ambulatory Visit (HOSPITAL_COMMUNITY)
Admission: RE | Admit: 2015-07-11 | Discharge: 2015-07-11 | Disposition: A | Discharge status: Home / Self Care | Payer: Medicare Other | Source: Ambulatory Visit | Attending: Vascular Surgery | Admitting: Vascular Surgery

## 2015-07-11 ENCOUNTER — Other Ambulatory Visit: Payer: Self-pay | Admitting: Internal Medicine

## 2015-07-11 DIAGNOSIS — F17200 Nicotine dependence, unspecified, uncomplicated: Secondary | ICD-10-CM | POA: Diagnosis not present

## 2015-07-11 DIAGNOSIS — L97329 Non-pressure chronic ulcer of left ankle with unspecified severity: Secondary | ICD-10-CM | POA: Diagnosis not present

## 2015-07-11 DIAGNOSIS — I739 Peripheral vascular disease, unspecified: Secondary | ICD-10-CM | POA: Diagnosis not present

## 2015-07-11 DIAGNOSIS — J449 Chronic obstructive pulmonary disease, unspecified: Secondary | ICD-10-CM | POA: Diagnosis not present

## 2015-07-11 DIAGNOSIS — L97521 Non-pressure chronic ulcer of other part of left foot limited to breakdown of skin: Secondary | ICD-10-CM | POA: Diagnosis not present

## 2015-07-11 DIAGNOSIS — Z9221 Personal history of antineoplastic chemotherapy: Secondary | ICD-10-CM | POA: Diagnosis not present

## 2015-07-11 DIAGNOSIS — I10 Essential (primary) hypertension: Secondary | ICD-10-CM | POA: Diagnosis not present

## 2015-07-18 DIAGNOSIS — Z9221 Personal history of antineoplastic chemotherapy: Secondary | ICD-10-CM | POA: Diagnosis not present

## 2015-07-18 DIAGNOSIS — L97521 Non-pressure chronic ulcer of other part of left foot limited to breakdown of skin: Secondary | ICD-10-CM | POA: Diagnosis not present

## 2015-07-18 DIAGNOSIS — I10 Essential (primary) hypertension: Secondary | ICD-10-CM | POA: Diagnosis not present

## 2015-07-18 DIAGNOSIS — F17200 Nicotine dependence, unspecified, uncomplicated: Secondary | ICD-10-CM | POA: Diagnosis not present

## 2015-07-18 DIAGNOSIS — I739 Peripheral vascular disease, unspecified: Secondary | ICD-10-CM | POA: Diagnosis not present

## 2015-07-18 DIAGNOSIS — J449 Chronic obstructive pulmonary disease, unspecified: Secondary | ICD-10-CM | POA: Diagnosis not present

## 2015-07-25 ENCOUNTER — Encounter (HOSPITAL_BASED_OUTPATIENT_CLINIC_OR_DEPARTMENT_OTHER): Payer: Medicare Other | Attending: Internal Medicine

## 2015-07-25 DIAGNOSIS — I10 Essential (primary) hypertension: Secondary | ICD-10-CM | POA: Diagnosis not present

## 2015-07-25 DIAGNOSIS — L89892 Pressure ulcer of other site, stage 2: Secondary | ICD-10-CM | POA: Insufficient documentation

## 2015-07-25 DIAGNOSIS — W19XXXA Unspecified fall, initial encounter: Secondary | ICD-10-CM | POA: Insufficient documentation

## 2015-07-25 DIAGNOSIS — J449 Chronic obstructive pulmonary disease, unspecified: Secondary | ICD-10-CM | POA: Insufficient documentation

## 2015-07-25 DIAGNOSIS — I499 Cardiac arrhythmia, unspecified: Secondary | ICD-10-CM | POA: Insufficient documentation

## 2015-07-25 DIAGNOSIS — S41102A Unspecified open wound of left upper arm, initial encounter: Secondary | ICD-10-CM | POA: Diagnosis not present

## 2015-08-01 DIAGNOSIS — S41102A Unspecified open wound of left upper arm, initial encounter: Secondary | ICD-10-CM | POA: Diagnosis not present

## 2015-08-01 DIAGNOSIS — I10 Essential (primary) hypertension: Secondary | ICD-10-CM | POA: Diagnosis not present

## 2015-08-01 DIAGNOSIS — L89892 Pressure ulcer of other site, stage 2: Secondary | ICD-10-CM | POA: Diagnosis not present

## 2015-08-01 DIAGNOSIS — J449 Chronic obstructive pulmonary disease, unspecified: Secondary | ICD-10-CM | POA: Diagnosis not present

## 2015-08-01 DIAGNOSIS — I499 Cardiac arrhythmia, unspecified: Secondary | ICD-10-CM | POA: Diagnosis not present

## 2015-08-06 DIAGNOSIS — C61 Malignant neoplasm of prostate: Secondary | ICD-10-CM | POA: Diagnosis not present

## 2015-08-06 DIAGNOSIS — N139 Obstructive and reflux uropathy, unspecified: Secondary | ICD-10-CM | POA: Diagnosis not present

## 2015-08-08 ENCOUNTER — Ambulatory Visit: Payer: Self-pay | Admitting: Physician Assistant

## 2015-08-08 DIAGNOSIS — I499 Cardiac arrhythmia, unspecified: Secondary | ICD-10-CM | POA: Diagnosis not present

## 2015-08-08 DIAGNOSIS — I10 Essential (primary) hypertension: Secondary | ICD-10-CM | POA: Diagnosis not present

## 2015-08-08 DIAGNOSIS — J449 Chronic obstructive pulmonary disease, unspecified: Secondary | ICD-10-CM | POA: Diagnosis not present

## 2015-08-08 DIAGNOSIS — S41102A Unspecified open wound of left upper arm, initial encounter: Secondary | ICD-10-CM | POA: Diagnosis not present

## 2015-08-08 DIAGNOSIS — L89892 Pressure ulcer of other site, stage 2: Secondary | ICD-10-CM | POA: Diagnosis not present

## 2015-08-12 DIAGNOSIS — Z8551 Personal history of malignant neoplasm of bladder: Secondary | ICD-10-CM | POA: Diagnosis not present

## 2015-08-14 ENCOUNTER — Ambulatory Visit (INDEPENDENT_AMBULATORY_CARE_PROVIDER_SITE_OTHER): Payer: Medicare Other | Admitting: Physician Assistant

## 2015-08-14 ENCOUNTER — Encounter: Payer: Self-pay | Admitting: Physician Assistant

## 2015-08-14 VITALS — BP 136/68 | HR 80 | Temp 97.9°F | Resp 16 | Ht 69.0 in | Wt 148.4 lb

## 2015-08-14 DIAGNOSIS — E782 Mixed hyperlipidemia: Secondary | ICD-10-CM

## 2015-08-14 DIAGNOSIS — Z79899 Other long term (current) drug therapy: Secondary | ICD-10-CM

## 2015-08-14 DIAGNOSIS — N390 Urinary tract infection, site not specified: Secondary | ICD-10-CM

## 2015-08-14 DIAGNOSIS — I10 Essential (primary) hypertension: Secondary | ICD-10-CM | POA: Diagnosis not present

## 2015-08-14 DIAGNOSIS — E559 Vitamin D deficiency, unspecified: Secondary | ICD-10-CM

## 2015-08-14 DIAGNOSIS — R7309 Other abnormal glucose: Secondary | ICD-10-CM

## 2015-08-14 DIAGNOSIS — R7303 Prediabetes: Secondary | ICD-10-CM

## 2015-08-14 NOTE — Progress Notes (Signed)
Assessment and Plan:  Hypertension: Continue medication, monitor blood pressure at home. Continue DASH diet.  Reminder to go to the ER if any CP, SOB, nausea, dizziness, severe HA, changes vision/speech, left arm numbness and tingling, and jaw pain. Cholesterol: Continue diet and exercise. Check cholesterol.  Pre-diabetes-Continue diet and exercise. Check A1C Vitamin D Def- check level and continue medications.  Recurrent UTI- check urine  Continue diet and meds as discussed. Further disposition pending results of labs.   HPI 81 y.o. male  presents for 3 month follow up with hypertension, hyperlipidemia, prediabetes and vitamin D. His daughter accompanies him.  His blood pressure has been controlled at home, today their BP is BP: 136/68 mmHg He does not workout. He denies chest pain, shortness of breath, dizziness.  He is not on cholesterol medication and denies myalgias. His cholesterol is at goal. The cholesterol last visit was:   Lab Results  Component Value Date   CHOL 151 01/22/2015   HDL 47 01/22/2015   LDLCALC 81 01/22/2015   TRIG 116 01/22/2015   CHOLHDL 3.2 01/22/2015   He has been working on diet and exercise for prediabetes, and denies paresthesia of the feet, polydipsia and polyuria. Last A1C in the office was:  Lab Results  Component Value Date   HGBA1C 5.9* 05/08/2015   Patient is on Vitamin D supplement.   Lab Results  Component Value Date   VD25OH 56 05/08/2015     He has indwelling catheter and has recurrent UTI's. Last urine had no growth. But he states that his urine has had an odor.  Most recenlty he has been treated in the office for foot ulcer and skin avulsion, which he has seen the wound center for, he had ABI that showed poor circulation. He is seeing them once a week. He is on vitamin C 1000mg  TID.   Current Medications:  Current Outpatient Prescriptions on File Prior to Visit  Medication Sig Dispense Refill  . b complex vitamins tablet Take 1 tablet  by mouth daily.    . Cholecalciferol (VITAMIN D3) 2000 UNITS capsule Take 4,000 Units by mouth daily.     . citalopram (CELEXA) 40 MG tablet TAKE 1 TABLET ONCE DAILY. 90 tablet 1  . docusate sodium (COLACE) 100 MG capsule Take 200 mg by mouth 2 (two) times daily.    . Ensure Plus (ENSURE PLUS) LIQD Take 237 mLs by mouth 2 (two) times daily between meals.    Marland Kitchen LORazepam (ATIVAN) 1 MG tablet TAKE 1/2 TO 1 TABLET THREE TIMES DAILY AS NEEDED. 90 tablet 5  . mirtazapine (REMERON) 30 MG tablet Take 15 mg by mouth at bedtime.     . mupirocin cream (BACTROBAN) 2 % Apply to affected area 1 times daily 30 g 0  . Probiotic Product (PROBIOTIC DAILY PO) Take 1 capsule by mouth daily.     Marland Kitchen triamcinolone cream (KENALOG) 0.1 % Apply 1 application topically 2 (two) times daily. 30 g 3   No current facility-administered medications on file prior to visit.   Medical History:  Past Medical History  Diagnosis Date  . Pancreatitis   . Fractured hip 09/2011  . Prostate tumor     to have bx by Dr. Jasmine December  . UTI (lower urinary tract infection) 06/13/2012    has had x 3 months  . Falls 06/13/12    hit head approx. 1 month ago  . Unsteady gait 06/13/2012    "bad for awhile but getting worse recently"  .  Slurred speech     slurred speech on 06/12/12 - didn't last long, slower talking recently  . Cancer     bladder  . Lung abnormality     spot on ct approx 3 months ago - to be followed - [facility]  . 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 Nose fracture     after a fall - Oct. 2012  . Hypertension   . Dysrhythmia     history of a-fib  - followed by Dr. Lovena Le   . Osteopenia   . Fractures, stress     spine  . 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 Hallucinations     will hospitalized -  . Foley catheter in place   . Anxiety   . Ulcer of ankle     rt  . Atrial fibrillation   . Urinary retention 07/04/2014   Allergies: No Known Allergies    ROS   Family history- Review and unchanged Social history- Review and unchanged Physical Exam: BP 136/68 mmHg  Pulse 80  Temp(Src) 97.9 F (36.6 C)  Resp 16  Ht 5\' 9"  (1.753 m)  Wt 148 lb 6.4 oz (67.314 kg)  BMI 21.90 kg/m2 Wt Readings from Last 3 Encounters:  08/14/15 148 lb 6.4 oz (67.314 kg)  06/13/15 151 lb (68.493 kg)  06/03/15 151 lb (68.493 kg)   HEENT: normocephalic, sclerae anicteric, TMs pearly, nares patent, with white patches on posterior pharynx and erythematous tongue.  Oral cavity: MMM, no lesions  Neck: supple, no lymphadenopathy, no thyromegaly, no masses  Heart: RRR, normal S1, S2  Lungs: CTA bilaterally, no wheezes, rhonchi, or rales  Abdomen: +bs, soft, non tender, non distended, with suprapubic catheter in place, draining Musculoskeletal: nontender, no swelling, no obvious deformity, walks with walker Extremities: no edema, no cyanosis, no clubbing  Pulses: 2+ symmetric, upper and lower extremities, normal cap refill  Neurological: alert, oriented x 3, CN2-12 intact, 4/5 upper extremities and lower extremities, throughout, walks with a walker Psychiatric: normal affect, very pleasant     Vicie Mutters, PA-C 3:13 PM [facility]

## 2015-08-14 NOTE — Patient Instructions (Addendum)
Eucerin cream  vaseline or coconut.   Laceration Care, Adult A laceration is a cut or lesion that goes through all layers of the skin and into the tissue just beneath the skin. TREATMENT  Some lacerations may not require closure. Some lacerations may not be able to be closed due to an increased risk of infection. It is important to see your caregiver as soon as possible after an injury to minimize the risk of infection and maximize the opportunity for successful closure. If closure is appropriate, pain medicines may be given, if needed. The wound will be cleaned to help prevent infection. Your caregiver will use stitches (sutures), staples, wound glue (adhesive), or skin adhesive strips to repair the laceration. These tools bring the skin edges together to allow for faster healing and a better cosmetic outcome. However, all wounds will heal with a scar. Once the wound has healed, scarring can be minimized by covering the wound with sunscreen during the day for 1 full year. HOME CARE INSTRUCTIONS  For sutures or staples:  Keep the wound clean and dry.  If you were given a bandage (dressing), you should change it at least once a day. Also, change the dressing if it becomes wet or dirty, or as directed by your caregiver.  Wash the wound with soap and water 2 times a day. Rinse the wound off with water to remove all soap. Pat the wound dry with a clean towel.  After cleaning, apply a thin layer of the antibiotic ointment as recommended by your caregiver. This will help prevent infection and keep the dressing from sticking.  You may shower as usual after the first 24 hours. Do not soak the wound in water until the sutures are removed.  Only take over-the-counter or prescription medicines for pain, discomfort, or fever as directed by your caregiver.  Get your sutures or staples removed as directed by your caregiver. For skin adhesive strips:  Keep the wound clean and dry.  Do not get the skin  adhesive strips wet. You may bathe carefully, using caution to keep the wound dry.  If the wound gets wet, pat it dry with a clean towel.  Skin adhesive strips will fall off on their own. You may trim the strips as the wound heals. Do not remove skin adhesive strips that are still stuck to the wound. They will fall off in time. For wound adhesive:  You may briefly wet your wound in the shower or bath. Do not soak or scrub the wound. Do not swim. Avoid periods of heavy perspiration until the skin adhesive has fallen off on its own. After showering or bathing, gently pat the wound dry with a clean towel.  Do not apply liquid medicine, cream medicine, or ointment medicine to your wound while the skin adhesive is in place. This may loosen the film before your wound is healed.  If a dressing is placed over the wound, be careful not to apply tape directly over the skin adhesive. This may cause the adhesive to be pulled off before the wound is healed.  Avoid prolonged exposure to sunlight or tanning lamps while the skin adhesive is in place. Exposure to ultraviolet light in the first year will darken the scar.  The skin adhesive will usually remain in place for 5 to 10 days, then naturally fall off the skin. Do not pick at the adhesive film. You may need a tetanus shot if:  You cannot remember when you had your last tetanus  shot.  You have never had a tetanus shot. If you get a tetanus shot, your arm may swell, get red, and feel warm to the touch. This is common and not a problem. If you need a tetanus shot and you choose not to have one, there is a rare chance of getting tetanus. Sickness from tetanus can be serious. SEEK MEDICAL CARE IF:   You have redness, swelling, or increasing pain in the wound.  You see a red line that goes away from the wound.  You have yellowish-white fluid (pus) coming from the wound.  You have a fever.  You notice a bad smell coming from the wound or  dressing.  Your wound breaks open before or after sutures have been removed.  You notice something coming out of the wound such as wood or glass.  Your wound is on your hand or foot and you cannot move a finger or toe. SEEK IMMEDIATE MEDICAL CARE IF:   Your pain is not controlled with prescribed medicine.  You have severe swelling around the wound causing pain and numbness or a change in color in your arm, hand, leg, or foot.  Your wound splits open and starts bleeding.  You have worsening numbness, weakness, or loss of function of any joint around or beyond the wound.  You develop painful lumps near the wound or on the skin anywhere on your body. MAKE SURE YOU:   Understand these instructions.  Will watch your condition.  Will get help right away if you are not doing well or get worse. Document Released: 12/07/2005 Document Revised: 02/29/2012 Document Reviewed: 06/02/2011 [facility] Patient Information 2015 [place], [place]. This information is not intended to replace advice given to you by your health care provider. Make sure you discuss any questions you have with your health care provider.

## 2015-08-15 DIAGNOSIS — I10 Essential (primary) hypertension: Secondary | ICD-10-CM | POA: Diagnosis not present

## 2015-08-15 DIAGNOSIS — L89892 Pressure ulcer of other site, stage 2: Secondary | ICD-10-CM | POA: Diagnosis not present

## 2015-08-15 DIAGNOSIS — S41102A Unspecified open wound of left upper arm, initial encounter: Secondary | ICD-10-CM | POA: Diagnosis not present

## 2015-08-15 DIAGNOSIS — J449 Chronic obstructive pulmonary disease, unspecified: Secondary | ICD-10-CM | POA: Diagnosis not present

## 2015-08-15 DIAGNOSIS — I499 Cardiac arrhythmia, unspecified: Secondary | ICD-10-CM | POA: Diagnosis not present

## 2015-08-15 LAB — CBC WITH DIFFERENTIAL/PLATELET
Basophils Absolute: 0.1 10*3/uL (ref 0.0–0.1)
Basophils Relative: 1 % (ref 0–1)
Eosinophils Absolute: 0.3 10*3/uL (ref 0.0–0.7)
Eosinophils Relative: 4 % (ref 0–5)
HCT: 40.6 % (ref 39.0–52.0)
Hemoglobin: 13.2 g/dL (ref 13.0–17.0)
Lymphocytes Relative: 20 % (ref 12–46)
Lymphs Abs: 1.7 10*3/uL (ref 0.7–4.0)
MCH: 30.6 pg (ref 26.0–34.0)
MCHC: 32.5 g/dL (ref 30.0–36.0)
MCV: 94 fL (ref 78.0–100.0)
MPV: 9.5 fL (ref 8.6–12.4)
Monocytes Absolute: 0.6 10*3/uL (ref 0.1–1.0)
Monocytes Relative: 7 % (ref 3–12)
Neutro Abs: 5.9 10*3/uL (ref 1.7–7.7)
Neutrophils Relative %: 68 % (ref 43–77)
Platelets: 265 10*3/uL (ref 150–400)
RBC: 4.32 MIL/uL (ref 4.22–5.81)
RDW: 15.7 % — ABNORMAL HIGH (ref 11.5–15.5)
WBC: 8.7 10*3/uL (ref 4.0–10.5)

## 2015-08-15 LAB — BASIC METABOLIC PANEL WITH GFR
BUN: 24 mg/dL (ref 7–25)
CO2: 23 mmol/L (ref 20–31)
Calcium: 8.9 mg/dL (ref 8.6–10.3)
Chloride: 102 mmol/L (ref 98–110)
Creat: 1.25 mg/dL — ABNORMAL HIGH (ref 0.70–1.11)
GFR, Est African American: 60 mL/min (ref >=60)
GFR, Est Non African American: 52 mL/min — ABNORMAL LOW (ref >=60)
Glucose, Bld: 66 mg/dL (ref 65–99)
Potassium: 4.9 mmol/L (ref 3.5–5.3)
Sodium: 138 mmol/L (ref 135–146)

## 2015-08-15 LAB — HEPATIC FUNCTION PANEL
ALK PHOS: 86 U/L (ref 40–115)
ALT: 13 U/L (ref 9–46)
AST: 21 U/L (ref 10–35)
Albumin: 3.7 g/dL (ref 3.6–5.1)
BILIRUBIN DIRECT: 0.1 mg/dL (ref <=0.2)
BILIRUBIN INDIRECT: 0.4 mg/dL (ref 0.2–1.2)
TOTAL PROTEIN: 7.1 g/dL (ref 6.1–8.1)
Total Bilirubin: 0.5 mg/dL (ref 0.2–1.2)

## 2015-08-15 LAB — URINALYSIS, MICROSCOPIC ONLY
CRYSTALS: NONE SEEN [HPF]
Casts: NONE SEEN [LPF]
YEAST: NONE SEEN [HPF]

## 2015-08-15 LAB — URINALYSIS, ROUTINE W REFLEX MICROSCOPIC
Bilirubin Urine: NEGATIVE
Glucose, UA: NEGATIVE
Ketones, ur: NEGATIVE
Nitrite: POSITIVE — AB
Specific Gravity, Urine: 1.018 (ref 1.001–1.035)
pH: 6 (ref 5.0–8.0)

## 2015-08-15 LAB — MAGNESIUM: MAGNESIUM: 2 mg/dL (ref 1.5–2.5)

## 2015-08-15 LAB — TSH: TSH: 3.41 u[IU]/mL (ref 0.350–4.500)

## 2015-08-15 LAB — VITAMIN D 25 HYDROXY (VIT D DEFICIENCY, FRACTURES): Vit D, 25-Hydroxy: 61 ng/mL (ref 30–100)

## 2015-08-19 LAB — URINE CULTURE

## 2015-08-20 ENCOUNTER — Other Ambulatory Visit: Payer: Self-pay | Admitting: Physician Assistant

## 2015-08-20 MED ORDER — CIPROFLOXACIN HCL 250 MG PO TABS: 250.0000 mg | ORAL_TABLET | Freq: Two times a day (BID) | ORAL | Status: AC

## 2015-08-22 ENCOUNTER — Encounter (HOSPITAL_BASED_OUTPATIENT_CLINIC_OR_DEPARTMENT_OTHER): Payer: Medicare Other | Attending: Internal Medicine

## 2015-08-22 DIAGNOSIS — L89892 Pressure ulcer of other site, stage 2: Secondary | ICD-10-CM | POA: Insufficient documentation

## 2015-08-22 DIAGNOSIS — I10 Essential (primary) hypertension: Secondary | ICD-10-CM | POA: Diagnosis not present

## 2015-08-22 DIAGNOSIS — W19XXXD Unspecified fall, subsequent encounter: Secondary | ICD-10-CM | POA: Insufficient documentation

## 2015-08-22 DIAGNOSIS — I499 Cardiac arrhythmia, unspecified: Secondary | ICD-10-CM | POA: Diagnosis not present

## 2015-08-22 DIAGNOSIS — S51802D Unspecified open wound of left forearm, subsequent encounter: Secondary | ICD-10-CM | POA: Diagnosis not present

## 2015-08-22 DIAGNOSIS — J449 Chronic obstructive pulmonary disease, unspecified: Secondary | ICD-10-CM | POA: Diagnosis not present

## 2015-08-24 ENCOUNTER — Other Ambulatory Visit: Payer: Self-pay | Admitting: Physician Assistant

## 2015-09-03 ENCOUNTER — Other Ambulatory Visit: Payer: Self-pay | Admitting: Ophthalmology

## 2015-09-03 DIAGNOSIS — H31009 Unspecified chorioretinal scars, unspecified eye: Secondary | ICD-10-CM | POA: Diagnosis not present

## 2015-09-03 DIAGNOSIS — H33011 Retinal detachment with single break, right eye: Secondary | ICD-10-CM | POA: Diagnosis not present

## 2015-09-04 ENCOUNTER — Ambulatory Visit (HOSPITAL_COMMUNITY): Admission: RE | Admit: 2015-09-04 | Payer: Medicare Other | Source: Ambulatory Visit | Admitting: Ophthalmology

## 2015-09-04 ENCOUNTER — Encounter (HOSPITAL_COMMUNITY): Admission: RE | Payer: Self-pay | Source: Ambulatory Visit

## 2015-09-04 DIAGNOSIS — Z961 Presence of intraocular lens: Secondary | ICD-10-CM | POA: Diagnosis not present

## 2015-09-04 DIAGNOSIS — H33001 Unspecified retinal detachment with retinal break, right eye: Secondary | ICD-10-CM | POA: Diagnosis not present

## 2015-09-04 DIAGNOSIS — H33011 Retinal detachment with single break, right eye: Secondary | ICD-10-CM | POA: Diagnosis not present

## 2015-09-04 SURGERY — PARS PLANA VITRECTOMY WITH 25 GAUGE
Anesthesia: Monitor Anesthesia Care | Laterality: Right

## 2015-09-10 ENCOUNTER — Ambulatory Visit (INDEPENDENT_AMBULATORY_CARE_PROVIDER_SITE_OTHER): Payer: Medicare Other | Admitting: *Deleted

## 2015-09-10 DIAGNOSIS — R300 Dysuria: Secondary | ICD-10-CM

## 2015-09-10 DIAGNOSIS — R339 Retention of urine, unspecified: Secondary | ICD-10-CM | POA: Diagnosis not present

## 2015-09-12 LAB — URINE CULTURE

## 2015-09-13 MED ORDER — SULFAMETHOXAZOLE-TRIMETHOPRIM 800-160 MG PO TABS: 1.0000 | ORAL_TABLET | Freq: Two times a day (BID) | ORAL | Status: AC

## 2015-09-13 NOTE — Addendum Note (Signed)
Addended by: Vicie Mutters R on: 09/13/2015 07:16 AM   Modules accepted: Orders

## 2015-10-09 ENCOUNTER — Other Ambulatory Visit: Payer: Medicare Other

## 2015-10-09 DIAGNOSIS — R300 Dysuria: Secondary | ICD-10-CM | POA: Diagnosis not present

## 2015-10-09 DIAGNOSIS — R338 Other retention of urine: Secondary | ICD-10-CM | POA: Diagnosis not present

## 2015-10-10 LAB — URINALYSIS, ROUTINE W REFLEX MICROSCOPIC
Bilirubin Urine: NEGATIVE
Glucose, UA: NEGATIVE
Ketones, ur: NEGATIVE
NITRITE: NEGATIVE
PROTEIN: NEGATIVE
Specific Gravity, Urine: 1.005 (ref 1.001–1.035)
pH: 6 (ref 5.0–8.0)

## 2015-10-10 LAB — URINALYSIS, MICROSCOPIC ONLY
Bacteria, UA: NONE SEEN [HPF]
Casts: NONE SEEN [LPF]
Crystals: NONE SEEN [HPF]
RBC / HPF: NONE SEEN RBC/HPF (ref <=2)
YEAST: NONE SEEN [HPF]

## 2015-10-11 LAB — URINE CULTURE

## 2015-10-12 MED ORDER — SULFAMETHOXAZOLE-TRIMETHOPRIM 800-160 MG PO TABS: 1.0000 | ORAL_TABLET | Freq: Two times a day (BID) | ORAL | Status: DC

## 2015-11-06 ENCOUNTER — Other Ambulatory Visit: Payer: Self-pay

## 2015-11-06 DIAGNOSIS — N320 Bladder-neck obstruction: Secondary | ICD-10-CM | POA: Diagnosis not present

## 2015-11-06 DIAGNOSIS — R300 Dysuria: Secondary | ICD-10-CM | POA: Diagnosis not present

## 2015-11-06 DIAGNOSIS — N139 Obstructive and reflux uropathy, unspecified: Secondary | ICD-10-CM | POA: Diagnosis not present

## 2015-11-08 LAB — URINE CULTURE: Colony Count: >=100000

## 2015-11-10 ENCOUNTER — Other Ambulatory Visit: Payer: Self-pay | Admitting: Physician Assistant

## 2015-11-10 MED ORDER — SULFAMETHOXAZOLE-TRIMETHOPRIM 800-160 MG PO TABS: 1.0000 | ORAL_TABLET | Freq: Two times a day (BID) | ORAL | Status: DC

## 2015-11-12 ENCOUNTER — Encounter: Payer: Self-pay | Admitting: Internal Medicine

## 2015-11-12 ENCOUNTER — Ambulatory Visit (INDEPENDENT_AMBULATORY_CARE_PROVIDER_SITE_OTHER): Payer: Medicare Other | Admitting: Internal Medicine

## 2015-11-12 VITALS — BP 122/66 | HR 88 | Temp 97.7°F | Resp 16 | Ht 69.0 in | Wt 150.6 lb

## 2015-11-12 DIAGNOSIS — T83511S Infection and inflammatory reaction due to indwelling urethral catheter, sequela: Secondary | ICD-10-CM

## 2015-11-12 DIAGNOSIS — Z6821 Body mass index (BMI) 21.0-21.9, adult: Secondary | ICD-10-CM

## 2015-11-12 DIAGNOSIS — E559 Vitamin D deficiency, unspecified: Secondary | ICD-10-CM | POA: Diagnosis not present

## 2015-11-12 DIAGNOSIS — Z23 Encounter for immunization: Secondary | ICD-10-CM

## 2015-11-12 DIAGNOSIS — Z79899 Other long term (current) drug therapy: Secondary | ICD-10-CM | POA: Diagnosis not present

## 2015-11-12 DIAGNOSIS — F32A Depression, unspecified: Secondary | ICD-10-CM | POA: Insufficient documentation

## 2015-11-12 DIAGNOSIS — R7309 Other abnormal glucose: Secondary | ICD-10-CM | POA: Diagnosis not present

## 2015-11-12 DIAGNOSIS — E782 Mixed hyperlipidemia: Secondary | ICD-10-CM

## 2015-11-12 DIAGNOSIS — N390 Urinary tract infection, site not specified: Secondary | ICD-10-CM | POA: Diagnosis not present

## 2015-11-12 DIAGNOSIS — I10 Essential (primary) hypertension: Secondary | ICD-10-CM | POA: Diagnosis not present

## 2015-11-12 DIAGNOSIS — F329 Major depressive disorder, single episode, unspecified: Secondary | ICD-10-CM | POA: Diagnosis not present

## 2015-11-12 DIAGNOSIS — R7303 Prediabetes: Secondary | ICD-10-CM

## 2015-11-12 LAB — HEPATIC FUNCTION PANEL
ALK PHOS: 88 U/L (ref 40–115)
ALT: 10 U/L (ref 9–46)
AST: 18 U/L (ref 10–35)
Albumin: 3.6 g/dL (ref 3.6–5.1)
BILIRUBIN DIRECT: 0.1 mg/dL (ref <=0.2)
BILIRUBIN INDIRECT: 0.4 mg/dL (ref 0.2–1.2)
TOTAL PROTEIN: 7.1 g/dL (ref 6.1–8.1)
Total Bilirubin: 0.5 mg/dL (ref 0.2–1.2)

## 2015-11-12 LAB — CBC WITH DIFFERENTIAL/PLATELET
BASOS PCT: 1 % (ref 0–1)
Basophils Absolute: 0.1 10*3/uL (ref 0.0–0.1)
EOS ABS: 0.5 10*3/uL (ref 0.0–0.7)
Eosinophils Relative: 5 % (ref 0–5)
HEMATOCRIT: 39.6 % (ref 39.0–52.0)
HEMOGLOBIN: 13.5 g/dL (ref 13.0–17.0)
LYMPHS ABS: 2.2 10*3/uL (ref 0.7–4.0)
LYMPHS PCT: 24 % (ref 12–46)
MCH: 31.6 pg (ref 26.0–34.0)
MCHC: 34.1 g/dL (ref 30.0–36.0)
MCV: 92.7 fL (ref 78.0–100.0)
MPV: 9.3 fL (ref 8.6–12.4)
Monocytes Absolute: 0.6 10*3/uL (ref 0.1–1.0)
Monocytes Relative: 7 % (ref 3–12)
NEUTROS ABS: 5.7 10*3/uL (ref 1.7–7.7)
NEUTROS PCT: 63 % (ref 43–77)
Platelets: 261 10*3/uL (ref 150–400)
RBC: 4.27 MIL/uL (ref 4.22–5.81)
RDW: 15.1 % (ref 11.5–15.5)
WBC: 9 10*3/uL (ref 4.0–10.5)

## 2015-11-12 LAB — BASIC METABOLIC PANEL WITH GFR
BUN: 23 mg/dL (ref 7–25)
CHLORIDE: 106 mmol/L (ref 98–110)
CO2: 26 mmol/L (ref 20–31)
Calcium: 9 mg/dL (ref 8.6–10.3)
Creat: 1.36 mg/dL — ABNORMAL HIGH (ref 0.70–1.11)
GFR, EST NON AFRICAN AMERICAN: 47 mL/min — AB (ref >=60)
GFR, Est African American: 54 mL/min — ABNORMAL LOW (ref >=60)
GLUCOSE: 67 mg/dL (ref 65–99)
Potassium: 4.5 mmol/L (ref 3.5–5.3)
SODIUM: 139 mmol/L (ref 135–146)

## 2015-11-12 LAB — MAGNESIUM: Magnesium: 2 mg/dL (ref 1.5–2.5)

## 2015-11-12 NOTE — Patient Instructions (Signed)

## 2015-11-12 NOTE — Progress Notes (Signed)
Patient ID: Brent Pickerel., male   DOB: March 19, 1927, 81 y.o.   MRN: [id number]     This very nice 81 y.o. WWM presents for  month follow up with Hypertension, Hyperlipidemia, Pre-Diabetes and Vitamin D Deficiency. Patient is s/p prostatectomy and bladder cystectomy for cancers of the prostate & bladder an has an ileal loop with indwelling catheter. Patient's last 3 Urine C&S have grown MRSA with suspected colonization and he was recently rx'x SXT TMP by sensitivities, but his daughter has not started the medications. He has had no local or systemic signs of infection.      Patient is treated for HTN & BP has been controlled at home. Today's BP: 122/66 mmHg. Patient has had no complaints of any cardiac type chest pain, palpitations, dyspnea/orthopnea/PND, dizziness, claudication, or dependent edema.     Hyperlipidemia is controlled with diet & meds. Patient denies myalgias or other med SE's. Last Lipids were at goal with Cholesterol 151; HDL 47; LDL 81; Triglycerides 116 on 01/22/2015.     Also, the patient has history of PreDiabetes and has had no symptoms of reactive hypoglycemia, diabetic polys, paresthesias or visual blurring.  Last A1c was  5.9% on 05/08/2015.      Further, the patient also has history of Vitamin D Deficiency and supplements vitamin D without any suspected side-effects. Last vitamin D was 61 on 08/14/2015.  Medication Sig  . b complex vitamins tablet Take 1 tablet by mouth daily.  Marland Kitchen VITAMIN D 2000 UNITS cap Take 4,000 Units by mouth daily.   . citalopram (CELEXA) 40 MG tablet TAKE 1 TABLET ONCE DAILY.  Marland Kitchen COLACE 100 MG capsule Take 200 mg by mouth 2 (two) times daily.  . Ensure Plus  Take 237 mLs by mouth 2 (two) times daily between meals.  Marland Kitchen LORazepam (ATIVAN) 1 MG tablet TAKE 1/2 TO 1 TABLET THREE TIMES DAILY AS NEEDED.  . mirtazapine  30 MG tablet TAKE ONE TABLET AT BEDTIME.  . mupirocin crm (BACTROBAN) 2 % Apply to affected area 1 times daily  . Probiotic   Take 1 capsule  by mouth daily.   Marland Kitchen triamcinolone crm (KENALOG) 0.1 % Apply 1 application topically 2 (two) times daily.      Marland Kitchen sulfamethoxazole-trimethoprim (SEPTRA DS) 800-160 MG Take 1 tablet by mouth 2 (two) times daily. (Patient not taking: Reported on 11/12/2015)   No Known Allergies  PMHx:   Past Medical History  Diagnosis Date  . Pancreatitis   . Fractured hip ([place]) 09/2011  . Prostate tumor     to have bx by Dr. Jasmine December  . UTI (lower urinary tract infection) 06/13/2012    has had x 3 months  . Falls 06/13/12    hit head approx. 1 month ago  . Unsteady gait 06/13/2012    "bad for awhile but getting worse recently"  . Slurred speech     slurred speech on 06/12/12 - didn't last long, slower talking recently  . Cancer ([place])     bladder  . Lung abnormality     spot on ct approx 3 months ago - to be followed - [facility]  . 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 Nose fracture     after a fall - Oct. 2012  . Hypertension   . Dysrhythmia     history of a-fib  - followed by Dr. Lovena Le   . Osteopenia   . Fractures, stress     spine  .  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 Hallucinations     will hospitalized -  . Foley catheter in place   . Anxiety   . Ulcer of ankle ([place])     rt  . Atrial fibrillation ([place])   . Urinary retention 07/04/2014   Immunization History  Administered Date(s) Administered  . Influenza, High Dose Seasonal PF 10/05/2013, 11/12/2015  . Influenza-Unspecified 10/21/2012, 09/26/2014  . Pneumococcal-Unspecified 10/05/2011  . Td 08/15/2013  . Zoster 01/19/2012   Past Surgical History  Procedure Laterality Date  . Cholecystectomy    . Partial hip arthroplasty    . Cystoscopy      has yearly due to bladder cancer  . Retinal detachment surgery      at least 3 and have been in both eyes  . Transurethral resection of bladder tumor    . Transurethral resection of prostate  01/18/2013    Procedure: TRANSURETHRAL  RESECTION OF THE PROSTATE WITH GYRUS INSTRUMENTS;  Surgeon: Molli Hazard, MD;  Location: [facility];  Service: Urology;  Laterality: N/A;  CYSTOSCOPY, SUPRAPUBIC TUBE PLACEMENT, CHANNEL TURP, RECTAL EXAM    . Insertion of suprapubic catheter  01/18/2013    Procedure: INSERTION OF SUPRAPUBIC CATHETER;  Surgeon: Molli Hazard, MD;  Location: [facility];  Service: Urology;  Laterality: N/A;  . Cystoscopy  01/18/2013    Procedure: CYSTOSCOPY;  Surgeon: Molli Hazard, MD;  Location: [facility];  Service: Urology;  Laterality: N/A;   FHx:    Reviewed / unchanged  SHx:    Reviewed / unchanged  Systems Review:  Constitutional: Denies fever, chills, wt changes, headaches, insomnia, fatigue, night sweats, change in appetite. Eyes: Denies redness, blurred vision, diplopia, discharge, itchy, watery eyes.  ENT: Denies discharge, congestion, post nasal drip, epistaxis, sore throat, earache, hearing loss, dental pain, tinnitus, vertigo, sinus pain, snoring.  CV: Denies chest pain, palpitations, irregular heartbeat, syncope, dyspnea, diaphoresis, orthopnea, PND, claudication or edema. Respiratory: denies cough, dyspnea, DOE, pleurisy, hoarseness, laryngitis, wheezing.  Gastrointestinal: Denies dysphagia, odynophagia, heartburn, reflux, water brash, abdominal pain or cramps, nausea, vomiting, bloating, diarrhea, constipation, hematemesis, melena, hematochezia  or hemorrhoids. Genitourinary: Denies dysuria, frequency, urgency, nocturia, hesitancy, discharge, hematuria or flank pain. Musculoskeletal: Denies arthralgias, myalgias, stiffness, jt. swelling, pain, limping or strain/sprain.  Skin: Denies pruritus, rash, hives, warts, acne, eczema or change in skin lesion(s). Neuro: No weakness, tremor, incoordination, spasms, paresthesia or pain. Psychiatric: Denies confusion, memory loss or sensory loss. Endo: Denies change in weight, skin or hair change.  Heme/Lymph: No excessive bleeding, bruising or  enlarged lymph nodes.  Physical Exam  BP 122/66 mmHg  Pulse 88  Temp(Src) 97.7 F (36.5 C)  Resp 16  Ht 5\' 9"  (1.753 m)  Wt 150 lb 9.6 oz (68.312 kg)  BMI 22.23 kg/m2  Appears chronically ill nourished and in no distress.  Eyes: PERRLA, EOMs, conjunctiva no swelling or erythema. Sinuses: No frontal/maxillary tenderness ENT/Mouth: EAC's clear, TM's nl w/o erythema, bulging. Nares clear w/o erythema, swelling, exudates. Oropharynx clear without erythema or exudates. Oral hygiene is good. Tongue normal, non obstructing. Hearing intact.  Neck: Supple. Thyroid nl. Car 2+/2+ without bruits, nodes or JVD. Chest: Respirations nl with BS clear & equal w/o rales, rhonchi, wheezing or stridor.  Cor: Heart sounds normal w/ regular rate and rhythm without sig. murmurs, gallops, clicks, or rubs. Peripheral pulses normal and equal  without edema.  Abdomen: Soft & bowel sounds normal. Non-tender w/o guarding, rebound, hernias, masses, or organomegaly.  Lymphatics: Unremarkable.  Musculoskeletal: Full ROM all peripheral extremities, joint stability, 5/5 strength and gait stabilized with a walker.  Skin: Warm, dry without exposed rashes, lesions or ecchymosis apparent.  Neuro: Cranial nerves intact, reflexes equal bilaterally. Sensory-motor testing grossly intact. Tendon reflexes grossly intact.  Pysch: Alert & oriented x 3.  Insight and judgement nl & appropriate. No ideations.  Assessment and Plan:  1. Essential hypertension  - TSH  2. Hyperlipidemia   3. Prediabetes  - Hemoglobin A1c - Insulin, random  4. Vitamin D deficiency  - VITAMIN D 25 Hydroxy   5. Medication management  - CBC with Differential/Platelet - BASIC METABOLIC PANEL WITH GFR - Hepatic function panel - Magnesium  6. Body mass index (BMI) of 21.0-21.9 in adult   7. Need for prophylactic vaccination and inoculation against influenza  - Flu vaccine HIGH DOSE PF (Fluzone High dose)   Recommended regular  exercise, BP monitoring, weight control, and discussed med and SE's. Recommended labs to assess and monitor clinical status. Further disposition pending results of labs. Over 30 minutes of exam, counseling, chart review was performed

## 2015-11-13 LAB — INSULIN, RANDOM: Insulin: 12.6 u[IU]/mL (ref 2.0–19.6)

## 2015-11-13 LAB — VITAMIN D 25 HYDROXY (VIT D DEFICIENCY, FRACTURES): Vit D, 25-Hydroxy: 66 ng/mL (ref 30–100)

## 2015-11-13 LAB — HEMOGLOBIN A1C
Hgb A1c MFr Bld: 5.9 % — ABNORMAL HIGH
Mean Plasma Glucose: 123 mg/dL — ABNORMAL HIGH

## 2015-11-13 LAB — TSH: TSH: 1.996 u[IU]/mL (ref 0.350–4.500)

## 2015-11-19 ENCOUNTER — Other Ambulatory Visit: Payer: Self-pay | Admitting: Internal Medicine

## 2015-11-27 ENCOUNTER — Other Ambulatory Visit: Payer: Self-pay | Admitting: Internal Medicine

## 2015-11-28 NOTE — Telephone Encounter (Signed)
Rx called in 

## 2015-12-10 DIAGNOSIS — R338 Other retention of urine: Secondary | ICD-10-CM | POA: Diagnosis not present

## 2015-12-18 ENCOUNTER — Ambulatory Visit: Payer: Self-pay | Admitting: Physician Assistant

## 2015-12-18 ENCOUNTER — Encounter: Payer: Self-pay | Admitting: Physician Assistant

## 2015-12-18 ENCOUNTER — Ambulatory Visit (INDEPENDENT_AMBULATORY_CARE_PROVIDER_SITE_OTHER): Payer: Medicare Other | Admitting: Physician Assistant

## 2015-12-18 VITALS — BP 140/60 | HR 66 | Temp 97.7°F | Resp 16 | Ht 69.0 in | Wt 152.8 lb

## 2015-12-18 DIAGNOSIS — F32A Depression, unspecified: Secondary | ICD-10-CM

## 2015-12-18 DIAGNOSIS — N390 Urinary tract infection, site not specified: Secondary | ICD-10-CM

## 2015-12-18 DIAGNOSIS — I10 Essential (primary) hypertension: Secondary | ICD-10-CM

## 2015-12-18 DIAGNOSIS — E559 Vitamin D deficiency, unspecified: Secondary | ICD-10-CM

## 2015-12-18 DIAGNOSIS — R7303 Prediabetes: Secondary | ICD-10-CM

## 2015-12-18 DIAGNOSIS — Z79899 Other long term (current) drug therapy: Secondary | ICD-10-CM

## 2015-12-18 DIAGNOSIS — I480 Paroxysmal atrial fibrillation: Secondary | ICD-10-CM

## 2015-12-18 DIAGNOSIS — C679 Malignant neoplasm of bladder, unspecified: Secondary | ICD-10-CM

## 2015-12-18 DIAGNOSIS — Z0000 Encounter for general adult medical examination without abnormal findings: Secondary | ICD-10-CM

## 2015-12-18 DIAGNOSIS — E782 Mixed hyperlipidemia: Secondary | ICD-10-CM

## 2015-12-18 DIAGNOSIS — F329 Major depressive disorder, single episode, unspecified: Secondary | ICD-10-CM

## 2015-12-18 DIAGNOSIS — I495 Sick sinus syndrome: Secondary | ICD-10-CM

## 2015-12-18 DIAGNOSIS — R911 Solitary pulmonary nodule: Secondary | ICD-10-CM

## 2015-12-18 DIAGNOSIS — H3530 Unspecified macular degeneration: Secondary | ICD-10-CM

## 2015-12-18 DIAGNOSIS — R339 Retention of urine, unspecified: Secondary | ICD-10-CM

## 2015-12-18 NOTE — Progress Notes (Addendum)
Complete Physical  Assessment and Plan: 1. Essential hypertension - continue medications, DASH diet, exercise and monitor at home. Call if greater than 130/80.   2. Paroxysmal atrial fibrillation (HCC) NSR, not on coag due to fall risk  3. Recurrent UTI - Urinalysis, Routine w reflex microscopic (not at [facility]) - Urine culture  4. SINUS BRADYCARDIA monitor  5. Malignant neoplasm of urinary bladder, unspecified site [facility]) S/p treatment cont follow up  6.  Depression, controlled remission  7. Urinary retention improved  8. Prediabetes Discussed general issues about diabetes pathophysiology and management., Educational material distributed., Suggested low cholesterol diet., Encouraged aerobic exercise., Discussed foot care., Reminded to get yearly retinal exam.  9. Vitamin D deficiency  10. Pulmonary nodule Monitor, OVER DUE FOR REPEAT, will schedule.   11. Hyperlipidemia -continue medications, check lipids, decrease fatty foods, increase activity.   12. Macular degeneration Continue eye doctor follow up  13. Medication management   Discussed med's effects and SE's. Screening labs and tests as requested with regular follow-up as recommended. Over 40 minutes of exam, counseling, chart review and critical decision making was performed  HPI Patient presents for a complete physical.   His blood pressure has been controlled at home, today their BP is BP: 140/60 mmHg He does not workout. He denies chest pain, shortness of breath, dizziness.  He has a history of Afib, not on anticoagulation due to fall risk.  Has weakness, frequent falls, walks with a walker.  He has history of prostate and bladder cancer, has suprapubic catheter, has recurrent UTIs that have cultured + for MRSA, follows with Dr. Jasmine December and Dr. Collene Mares.  He is not on cholesterol medication and denies myalgias. His cholesterol is not at goal. The cholesterol last visit was:   Lab Results  Component Value Date    CHOL 151 01/22/2015   HDL 47 01/22/2015   LDLCALC 81 01/22/2015   TRIG 116 01/22/2015   CHOLHDL 3.2 01/22/2015   He has been working on diet and exercise for prediabetes,  and denies paresthesia of the feet, polydipsia, polyuria and visual disturbances. Last A1C in the office was:  Lab Results  Component Value Date   HGBA1C 5.9* 11/12/2015  Last GFR: Lab Results  Component Value Date   GFRNONAA 47* 11/12/2015  Patient is on Vitamin D supplement.   Lab Results  Component Value Date   VD25OH 66 11/12/2015     Current Medications:  Current Outpatient Prescriptions on File Prior to Visit  Medication Sig Dispense Refill  . Ascorbic Acid (VITAMIN C) 1000 MG tablet Take 1,000 mg by mouth 3 (three) times daily.    Marland Kitchen b complex vitamins tablet Take 1 tablet by mouth daily.    . Cholecalciferol (VITAMIN D3) 2000 UNITS capsule Take 4,000 Units by mouth daily.     . citalopram (CELEXA) 40 MG tablet TAKE 1 TABLET ONCE DAILY. 90 tablet 0  . docusate sodium (COLACE) 100 MG capsule Take 200 mg by mouth 2 (two) times daily.    . Ensure Plus (ENSURE PLUS) LIQD Take 237 mLs by mouth 2 (two) times daily between meals.    Marland Kitchen LORazepam (ATIVAN) 1 MG tablet TAKE 1/2 TO 1 TABLET THREE TIMES DAILY AS NEEDED. 90 tablet 5  . mirtazapine (REMERON) 30 MG tablet TAKE ONE TABLET AT BEDTIME. 90 tablet 1  . mupirocin cream (BACTROBAN) 2 % Apply to affected area 1 times daily 30 g 0  . Probiotic Product (PROBIOTIC DAILY PO) Take 1 capsule by mouth daily.     Marland Kitchen  sulfamethoxazole-trimethoprim (BACTRIM DS,SEPTRA DS) 800-160 MG tablet Take 1 tablet by mouth 2 (two) times daily. 10 tablet 0  . triamcinolone cream (KENALOG) 0.1 % Apply 1 application topically 2 (two) times daily. 30 g 3   No current facility-administered medications on file prior to visit.   Health Maintenance:  Immunization History  Administered Date(s) Administered  . Influenza, High Dose Seasonal PF 10/05/2013, 11/12/2015  .  Influenza-Unspecified 10/21/2012, 09/26/2014  . Pneumococcal-Unspecified 10/05/2011  . Td 08/15/2013  . Zoster 01/19/2012   Tetanus: 2014 Pneumovax: 2012 Prevnar 13: out of in the office Flu vaccine: 2015 Zostavax: 2013 DEXA: Colonoscopy: 2004 EGD: 2012- Dr. Paulita Fujita CT chest 11/2014- ground glass, and + fractures.  MGM 04/2014 Echo: 08/2007 normal EF, Dr. Lovena Le ABI legs 2013 Eye Exam: [place] Dentist: None  Patient Care Team: Unk Pinto, MD as PCP - General (Internal Medicine)  Allergies: No Known Allergies Medical History:  Past Medical History  Diagnosis Date  . Pancreatitis   . Fractured hip ([place]) 09/2011  . Prostate tumor     to have bx by Dr. Jasmine December  . UTI (lower urinary tract infection) 06/13/2012    has had x 3 months  . Falls 06/13/12    hit head approx. 1 month ago  . Unsteady gait 06/13/2012    "bad for awhile but getting worse recently"  . Slurred speech     slurred speech on 06/12/12 - didn't last long, slower talking recently  . Cancer ([place])     bladder  . Lung abnormality     spot on ct approx 3 months ago - to be followed - [facility]  . 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 Nose fracture     after a fall - Oct. 2012  . Hypertension   . Dysrhythmia     history of a-fib  - followed by Dr. Lovena Le   . Osteopenia   . Fractures, stress     spine  . 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 Hallucinations     will hospitalized -  . Foley catheter in place   . Anxiety   . Ulcer of ankle ([place])     rt  . Atrial fibrillation ([place])   . Urinary retention 07/04/2014   Surgical History:  Past Surgical History  Procedure Laterality Date  . Cholecystectomy    . Partial hip arthroplasty    . Cystoscopy      has yearly due to bladder cancer  . Retinal detachment surgery      at least 3 and have been in both eyes  . Transurethral resection of bladder tumor    . Transurethral resection of prostate   01/18/2013    Procedure: TRANSURETHRAL RESECTION OF THE PROSTATE WITH GYRUS INSTRUMENTS;  Surgeon: Molli Hazard, MD;  Location: [facility];  Service: Urology;  Laterality: N/A;  CYSTOSCOPY, SUPRAPUBIC TUBE PLACEMENT, CHANNEL TURP, RECTAL EXAM    . Insertion of suprapubic catheter  01/18/2013    Procedure: INSERTION OF SUPRAPUBIC CATHETER;  Surgeon: Molli Hazard, MD;  Location: [facility];  Service: Urology;  Laterality: N/A;  . Cystoscopy  01/18/2013    Procedure: CYSTOSCOPY;  Surgeon: Molli Hazard, MD;  Location: [facility];  Service: Urology;  Laterality: N/A;   Family History:  Family History  Problem Relation Age of Onset  . Heart disease      No family history  . Pancreatitis Mother   .  Alcohol abuse Father    Social History:   Social History  Substance Use Topics  . Smoking status: Current Every Day Smoker -- 0.25 packs/day for 62 years    Types: Cigarettes  . Smokeless tobacco: None  . Alcohol Use: No   Review of Systems:  Review of Systems  Constitutional: Positive for malaise/fatigue. Negative for fever, chills, weight loss and diaphoresis.  HENT: Negative.   Respiratory: Negative.   Cardiovascular: Negative.   Gastrointestinal: Negative for heartburn, nausea, vomiting, abdominal pain, diarrhea, constipation, blood in stool and melena.  Genitourinary: Positive for frequency. Negative for dysuria, urgency, hematuria and flank pain.  Musculoskeletal: Positive for myalgias and back pain. Negative for joint pain, falls and neck pain.  Skin: Negative.   Neurological: Negative.  Negative for weakness.  Endo/Heme/Allergies: Negative.   Psychiatric/Behavioral: Positive for memory loss. Negative for depression, suicidal ideas, hallucinations and substance abuse. The patient is not nervous/anxious and does not have insomnia.     Physical Exam: Estimated body mass index is 22.55 kg/(m^2) as calculated from the following:   Height as of this encounter: 5\' 9"  (1.753  m).   Weight as of this encounter: 152 lb 12.8 oz (69.31 kg). BP 140/60 mmHg  Pulse 66  Temp(Src) 97.7 F (36.5 C) (Temporal)  Resp 16  Ht 5\' 9"  (1.753 m)  Wt 152 lb 12.8 oz (69.31 kg)  BMI 22.55 kg/m2  SpO2 89% HEENT: normocephalic, sclerae anicteric, TMs pearly, nares patent, posterior pharnyx normal.  Oral cavity: MMM, no lesions  Neck: supple, no lymphadenopathy, no thyromegaly, no masses  Heart: RRR, normal S1, S2  Lungs: CTA bilaterally, no wheezes, rhonchi, or rales  Abdomen: +bs, soft, non tender, non distended, with suprapubic catheter in place, draining Musculoskeletal: nontender, no swelling, no obvious deformity, walks with walker Extremities: no edema, no cyanosis, no clubbing  Pulses: 2+ symmetric, upper and lower extremities, normal cap refill  Neurological: alert, oriented x 3, CN2-12 intact, 4/5 upper extremities and lower extremities, throughout, walks with a walker Psychiatric: normal affect, very pleasant   EKG: defer AORTA SCAN: declines  Vicie Mutters 4:47 PM [facility]

## 2015-12-19 LAB — URINALYSIS, ROUTINE W REFLEX MICROSCOPIC
Bilirubin Urine: NEGATIVE
Glucose, UA: NEGATIVE
Ketones, ur: NEGATIVE
NITRITE: POSITIVE — AB
PH: 6 (ref 5.0–8.0)
Specific Gravity, Urine: 1.018 (ref 1.001–1.035)

## 2015-12-19 LAB — URINALYSIS, MICROSCOPIC ONLY
Casts: NONE SEEN [LPF]
Crystals: NONE SEEN [HPF]
Squamous Epithelial / LPF: NONE SEEN [HPF] (ref <=5)
Yeast: NONE SEEN [HPF]

## 2015-12-19 NOTE — Addendum Note (Signed)
Addended by: Vicie Mutters R on: 12/19/2015 07:10 AM   Modules accepted: Orders

## 2015-12-20 LAB — URINE CULTURE

## 2015-12-22 ENCOUNTER — Other Ambulatory Visit: Payer: Self-pay | Admitting: Internal Medicine

## 2015-12-22 DIAGNOSIS — N390 Urinary tract infection, site not specified: Secondary | ICD-10-CM

## 2015-12-22 MED ORDER — NITROFURANTOIN MONOHYD MACRO 100 MG PO CAPS: ORAL_CAPSULE | ORAL | Status: DC

## 2015-12-22 MED ORDER — SULFAMETHOXAZOLE-TRIMETHOPRIM 800-160 MG PO TABS: ORAL_TABLET | ORAL | Status: AC

## 2015-12-30 ENCOUNTER — Ambulatory Visit (HOSPITAL_COMMUNITY): Admission: RE | Admit: 2015-12-30 | Payer: Medicare Other | Source: Ambulatory Visit

## 2016-01-06 ENCOUNTER — Ambulatory Visit (HOSPITAL_COMMUNITY)
Admission: RE | Admit: 2016-01-06 | Discharge: 2016-01-06 | Disposition: A | Discharge status: Home / Self Care | Payer: Medicare Other | Source: Ambulatory Visit | Attending: Physician Assistant | Admitting: Physician Assistant

## 2016-01-06 DIAGNOSIS — R911 Solitary pulmonary nodule: Secondary | ICD-10-CM | POA: Insufficient documentation

## 2016-01-06 DIAGNOSIS — R918 Other nonspecific abnormal finding of lung field: Secondary | ICD-10-CM | POA: Diagnosis not present

## 2016-01-07 ENCOUNTER — Other Ambulatory Visit: Payer: Self-pay | Admitting: Physician Assistant

## 2016-01-07 DIAGNOSIS — R918 Other nonspecific abnormal finding of lung field: Secondary | ICD-10-CM

## 2016-01-07 DIAGNOSIS — R338 Other retention of urine: Secondary | ICD-10-CM | POA: Diagnosis not present

## 2016-01-21 ENCOUNTER — Ambulatory Visit (HOSPITAL_COMMUNITY)
Admission: RE | Admit: 2016-01-21 | Discharge: 2016-01-21 | Disposition: A | Discharge status: Home / Self Care | Payer: Medicare Other | Source: Ambulatory Visit | Attending: Physician Assistant | Admitting: Physician Assistant

## 2016-01-21 DIAGNOSIS — R918 Other nonspecific abnormal finding of lung field: Secondary | ICD-10-CM | POA: Insufficient documentation

## 2016-01-21 DIAGNOSIS — J984 Other disorders of lung: Secondary | ICD-10-CM | POA: Diagnosis not present

## 2016-01-21 DIAGNOSIS — I700 Atherosclerosis of aorta: Secondary | ICD-10-CM | POA: Insufficient documentation

## 2016-01-21 LAB — GLUCOSE, CAPILLARY: Glucose-Capillary: 78 mg/dL (ref 65–99)

## 2016-01-21 MED ORDER — FLUDEOXYGLUCOSE F - 18 (FDG) INJECTION
8.4800 | Freq: Once | INTRAVENOUS | Status: AC | PRN
  Administered 2016-01-21: 8.48 via INTRAVENOUS

## 2016-01-28 ENCOUNTER — Encounter: Payer: Self-pay | Admitting: Physician Assistant

## 2016-01-30 ENCOUNTER — Ambulatory Visit (INDEPENDENT_AMBULATORY_CARE_PROVIDER_SITE_OTHER): Payer: Medicare Other | Admitting: Physician Assistant

## 2016-01-30 ENCOUNTER — Encounter: Payer: Self-pay | Admitting: Physician Assistant

## 2016-01-30 VITALS — BP 110/64 | HR 82 | Temp 97.7°F | Resp 16 | Ht 69.0 in | Wt 150.4 lb

## 2016-01-30 DIAGNOSIS — R911 Solitary pulmonary nodule: Secondary | ICD-10-CM | POA: Diagnosis not present

## 2016-01-30 DIAGNOSIS — E559 Vitamin D deficiency, unspecified: Secondary | ICD-10-CM | POA: Diagnosis not present

## 2016-01-30 DIAGNOSIS — I10 Essential (primary) hypertension: Secondary | ICD-10-CM | POA: Diagnosis not present

## 2016-01-30 DIAGNOSIS — C679 Malignant neoplasm of bladder, unspecified: Secondary | ICD-10-CM | POA: Diagnosis not present

## 2016-01-30 DIAGNOSIS — R6889 Other general symptoms and signs: Secondary | ICD-10-CM | POA: Diagnosis not present

## 2016-01-30 DIAGNOSIS — I480 Paroxysmal atrial fibrillation: Secondary | ICD-10-CM

## 2016-01-30 DIAGNOSIS — F32A Depression, unspecified: Secondary | ICD-10-CM

## 2016-01-30 DIAGNOSIS — E441 Mild protein-calorie malnutrition: Secondary | ICD-10-CM

## 2016-01-30 DIAGNOSIS — H3530 Unspecified macular degeneration: Secondary | ICD-10-CM

## 2016-01-30 DIAGNOSIS — R7309 Other abnormal glucose: Secondary | ICD-10-CM | POA: Diagnosis not present

## 2016-01-30 DIAGNOSIS — Z0001 Encounter for general adult medical examination with abnormal findings: Secondary | ICD-10-CM | POA: Diagnosis not present

## 2016-01-30 DIAGNOSIS — E782 Mixed hyperlipidemia: Secondary | ICD-10-CM

## 2016-01-30 DIAGNOSIS — Z0000 Encounter for general adult medical examination without abnormal findings: Secondary | ICD-10-CM

## 2016-01-30 DIAGNOSIS — R7303 Prediabetes: Secondary | ICD-10-CM

## 2016-01-30 DIAGNOSIS — R339 Retention of urine, unspecified: Secondary | ICD-10-CM | POA: Diagnosis not present

## 2016-01-30 DIAGNOSIS — I495 Sick sinus syndrome: Secondary | ICD-10-CM | POA: Diagnosis not present

## 2016-01-30 DIAGNOSIS — Z79899 Other long term (current) drug therapy: Secondary | ICD-10-CM

## 2016-01-30 DIAGNOSIS — F329 Major depressive disorder, single episode, unspecified: Secondary | ICD-10-CM

## 2016-01-30 DIAGNOSIS — N390 Urinary tract infection, site not specified: Secondary | ICD-10-CM

## 2016-01-30 LAB — CBC WITH DIFFERENTIAL/PLATELET
Basophils Absolute: 0 10*3/uL (ref 0.0–0.1)
Basophils Relative: 0 % (ref 0–1)
Eosinophils Absolute: 0.3 10*3/uL (ref 0.0–0.7)
Eosinophils Relative: 4 % (ref 0–5)
HEMATOCRIT: 38.6 % — AB (ref 39.0–52.0)
HEMOGLOBIN: 12.5 g/dL — AB (ref 13.0–17.0)
LYMPHS PCT: 21 % (ref 12–46)
Lymphs Abs: 1.6 10*3/uL (ref 0.7–4.0)
MCH: 31.5 pg (ref 26.0–34.0)
MCHC: 32.4 g/dL (ref 30.0–36.0)
MCV: 97.2 fL (ref 78.0–100.0)
MONO ABS: 0.6 10*3/uL (ref 0.1–1.0)
MPV: 9.4 fL (ref 8.6–12.4)
Monocytes Relative: 8 % (ref 3–12)
NEUTROS ABS: 5.2 10*3/uL (ref 1.7–7.7)
NEUTROS PCT: 67 % (ref 43–77)
Platelets: 218 10*3/uL (ref 150–400)
RBC: 3.97 MIL/uL — AB (ref 4.22–5.81)
RDW: 15 % (ref 11.5–15.5)
WBC: 7.7 10*3/uL (ref 4.0–10.5)

## 2016-01-30 LAB — TSH: TSH: 2.34 mIU/L (ref 0.40–4.50)

## 2016-01-30 MED ORDER — TRIAMCINOLONE ACETONIDE 0.1 % EX CREA: 1.0000 "application " | TOPICAL_CREAM | Freq: Two times a day (BID) | CUTANEOUS | Status: AC

## 2016-01-30 MED ORDER — NITROGLYCERIN 0.4 MG SL SUBL: 0.4000 mg | SUBLINGUAL_TABLET | SUBLINGUAL | Status: AC | PRN

## 2016-01-30 MED ORDER — PREDNISONE 20 MG PO TABS: ORAL_TABLET | ORAL | Status: DC

## 2016-01-30 NOTE — Patient Instructions (Signed)
Add ENTERIC COATED low dose 81 mg Aspirin daily OR can do every other day if you have easy bruising to protect your heart and head. As well as to reduce risk of Colon Cancer by 20 %, Skin Cancer by 26 % , Melanoma by 46% and Pancreatic cancer by 60%  Heart Attack A heart attack (myocardial infarction, MI) causes damage to your heart that cannot be fixed. A heart attack can happen when a heart (coronary) artery becomes blocked or narrowed. This cuts off the blood supply and oxygen to your heart. When one or more of your coronary arteries becomes blocked, that area of your heart begins to die. This causes the pain you feel during a heart attack. Heart attack pain can also occur in one part of the body but be felt in another part of the body (referred pain). You may feel referred heart attack pain in your left arm, neck, or jaw. Pain may even be felt in the right arm. CAUSES  Many conditions can cause a heart attack. These include:   Atherosclerosis. This is when a fatty substance (plaque) gradually builds up in the arteries. This buildup can block or reduce the blood supply to one or more of the heart arteries.  A blood clot. A blood clot can develop suddenly when plaque breaks up (ruptures) within a heart artery. A blood clot can block the heart artery, which prevents blood flow to the heart.   Severe tightening (spasm) of the heart artery. This cuts off blood flow through the artery.  RISK FACTORS People at risk for heart attack usually have one or more of the following risk factors:   High blood pressure (hypertension).  High cholesterol.  Smoking.  Being male.  Being overweight or obese.  Older aged.   A family history of heart disease.  Lack of exercise.  Diabetes.  Stress.  Drinking too much alcohol.  Using illegal street drugs, such as cocaine and methamphetamines. SYMPTOMS  Heart attack symptoms can vary from person to person. Symptoms depend on factors like gender  and age.   In both men and women, heart attack symptoms can include the following:   Chest pain. This may feel like crushing, squeezing, or a feeling of pressure.  Shortness of breath.  Heartburn or indigestion with or without vomiting, shortness of breath, or sweating.  Sudden cold sweats.  Sudden light-headedness.  Upper back pain.   Women can have unique heart attack symptoms, such as:   Unexplained feelings of nervousness or anxiety.  Discomfort between the shoulder blades or upper back.  Tingling in the hands and arms.   Older people (of both genders) can have subtle heart attack symptoms, such as:   Sweating.  Shortness of breath.  General tiredness or not feeling well.  DIAGNOSIS  Diagnosing a heart attack involves several tests. They include:   An assessment of your vital signs. This includes checking your:  Heart rhythm.  Blood pressure.  Breathing rate.  Oxygen level.   An ECG (electrocardiogram) to measure the electrical activity of your heart.  Blood tests called cardiac markers. In these tests, blood is drawn at scheduled times to check for the specific proteins or enzymes released by damaged heart muscle.  A chest X-ray.  An echocardiogram to evaluate heart motion and blood flow.  Coronary angiography to look at the heart arteries.  TREATMENT  Treatment for a heart attack may include:   Medicine that breaks up or dissolves blood clots in the heart  artery.  Angioplasty.  Cardiac stent placement.  Intra-aortic balloon pump therapy (IABP).  Open heart surgery (coronary artery bypass graft, CABG). HOME CARE INSTRUCTIONS  Take medicines only as directed by your health care provider. You may need to take medicine after a heart attack to:   Keep your blood from clotting too easily.  Control your blood pressure.  Lower your cholesterol.  Control abnormal heart rhythms.   Do not take the following medicines unless your health  care provider approves:  Nonsteroidal anti-inflammatory drugs (NSAIDs), such as ibuprofen, naproxen, or celecoxib.  Vitamin supplements that contain vitamin A, vitamin E, or both.  Hormone replacement therapy that contains estrogen with or without progestin.  Make lifestyle changes as directed by your health care provider. These may include:   Quitting smoking, if you smoke.  Getting regular exercise. Ask your health care provider to suggest some activities that are safe for you.  Eating a heart-healthy diet. A registered dietitian can help you learn healthy eating options.  Maintaining a healthy weight.   Managing diabetes, if necessary.  Reducing stress.  Limiting how much alcohol you drink. SEEK IMMEDIATE MEDICAL CARE IF:   You have sudden, unexplained chest discomfort.  You have sudden, unexplained discomfort in your arms, back, neck, or jaw.  You have shortness of breath at any time.  You suddenly start to sweat or your skin gets clammy.  You feel nauseous or vomit.  You suddenly feel light-headed or dizzy.  Your heart begins to beat fast or feels like it is skipping beats. These symptoms may represent a serious problem that is an emergency. Do not wait to see if the symptoms will go away. Get medical help right away. Call your local emergency services (911 in the [place] Do not drive yourself to the hospital.   This information is not intended to replace advice given to you by your health care provider. Make sure you discuss any questions you have with your health care provider.   Document Released: 12/07/2005 Document Revised: 12/28/2014 Document Reviewed: 02/09/2014 Elsevier Interactive Patient Education [employer].

## 2016-01-30 NOTE — Progress Notes (Signed)
MEDICARE ANNUAL WELLNESS VISIT AND FOLLOW UP  Assessment:   1. Essential hypertension - continue medications, DASH diet, exercise and monitor at home. Call if greater than 130/80.  - CBC with Differential/Platelet - BASIC METABOLIC PANEL WITH GFR - Hepatic function panel - TSH - Urinalysis, Routine w reflex microscopic - Microalbumin / creatinine urine ratio - EKG 12-Lead  2. Paroxysmal atrial fibrillation NSR, high risk fall, not on coag medication  3. Prediabetes Discussed general issues about diabetes pathophysiology and management., Educational material distributed., Suggested low cholesterol diet., Encouraged aerobic exercise., Discussed foot care., Reminded to get yearly retinal exam. - Hemoglobin A1c - HM DIABETES FOOT EXAM  4. SINUS BRADYCARDIA stable  5. Urinary retention better  6. UTI (lower urinary tract infection) - Urine culture  7. Malignant neoplasm of urinary bladder, unspecified site Continue follow up  8. Malnutrition Improved with ensure, Remeron.   9. Medicare encounter Out of prevnar in the office.   10. Depression, in remission Continue celexa  11. Pulmonary nodule Likely slow growing carcinoma, per patient/daughter request will monitor.  He continues to smoke 1-3 cigs a day.   12. Vitamin D deficiency - Vit D  25 hydroxy (rtn osteoporosis monitoring)  13. Hyperlipidemia -continue medications, check lipids, decrease fatty foods, increase activity.  - Lipid panel   14. Encounter for long-term (current) use of medications - Magnesium  15. Macular degeneration Continue follow up   16. Abnormal EKG Asymptomatic at this time, EKG appears may have anterior/lateral strain/previous MI Due to age, will add bASA, will give NTG if any CP/SOB.   Plan:   During the course of the visit the patient was educated and counseled about appropriate screening and preventive services including:    Pneumococcal vaccine   Influenza vaccine  Td  vaccine  Screening electrocardiogram  Bone densitometry screening  Colorectal cancer screening  Diabetes screening  Glaucoma screening  Nutrition counseling   Advanced directives: requested   Conditions/risks identified: BMI: Discussed weight loss, diet, and increase physical activity.  Increase physical activity: AHA recommends 150 minutes of physical activity a week.  Medications reviewed Diabetes is at goal, ACE/ARB therapy: No, Reason not on Ace Inhibitor/ARB therapy:  preDM Urinary Incontinence is an issue: discussed non pharmacology and pharmacology options.  Fall risk: high- discussed PT, home fall assessment, medications.    Subjective:  Brent Day. is a 82 y.o. male who presents for Medicare Annual Wellness Visit and follow up.  Date of last medicare wellness visit was 01/2015   His blood pressure has been controlled at home, today their BP is BP: 110/64 mmHg He does workout, does push ups and squats.  He denies chest pain, shortness of breath, dizziness.  He has a history of Afib, he is not on anticoagulation due to fall risk.  He is not on cholesterol medication and denies myalgias. His cholesterol is not at goal. The cholesterol last visit was:   Lab Results  Component Value Date   CHOL 151 01/22/2015   HDL 47 01/22/2015   LDLCALC 81 01/22/2015   TRIG 116 01/22/2015   CHOLHDL 3.2 01/22/2015   He has been working on diet and exercise for prediabetes, and denies paresthesia of the feet, polydipsia, polyuria and visual disturbances. Last A1C in the office was:  Lab Results  Component Value Date   HGBA1C 5.9* 11/12/2015   Patient is on Vitamin D supplement.   Lab Results  Component Value Date   VD25OH 66 11/12/2015  He has a history of prostate cancer and bladder cancer, has suprapubic catheter and doing well. Has had some recurrent UTI's with + MRSA and he was being followed by Dr. Tammi Klippel, treated with ABX last  He has weakness, frequency  falls, walks with walker. He is on celexa for depression, on Remeron for appetite and on ensure of malnutrition.  Body mass index is 22.2 kg/(m^2). Wt Readings from Last 3 Encounters:  01/30/16 150 lb 6.4 oz (68.221 kg)  12/18/15 152 lb 12.8 oz (69.31 kg)  11/12/15 150 lb 9.6 oz (68.312 kg)   Patient had repeat CT chest 01/16 that showed ill defined ground glass opacity right upper lung, denser with air bronchogram, PET scan showed the nodule to be hypermetabolic without any other metastatic disease. After further discussion with patient and daughter, declines biopsy/futher work up, due to very slow growing/possiblity of low grade adenocarcinoma and age.   Names of Other Physician/Practitioners you currently use: 1. [facility] here for primary care 2. Dr. Zadie Rhine, eye doctor, last visit 6 months ago 3. Dr. Katy Fitch, eye doctor, 1 month ago.  Patient Care Team: Unk Pinto, MD as PCP - General (Internal Medicine)   Medication Review: Current Outpatient Prescriptions on File Prior to Visit  Medication Sig Dispense Refill  . Ascorbic Acid (VITAMIN C) 1000 MG tablet Take 1,000 mg by mouth 3 (three) times daily.    Marland Kitchen b complex vitamins tablet Take 1 tablet by mouth daily.    . Cholecalciferol (VITAMIN D3) 2000 UNITS capsule Take 4,000 Units by mouth daily.     . citalopram (CELEXA) 40 MG tablet TAKE 1 TABLET ONCE DAILY. 90 tablet 0  . docusate sodium (COLACE) 100 MG capsule Take 200 mg by mouth 2 (two) times daily.    . Ensure Plus (ENSURE PLUS) LIQD Take 237 mLs by mouth 2 (two) times daily between meals.    Marland Kitchen LORazepam (ATIVAN) 1 MG tablet TAKE 1/2 TO 1 TABLET THREE TIMES DAILY AS NEEDED. 90 tablet 5  . mirtazapine (REMERON) 30 MG tablet TAKE ONE TABLET AT BEDTIME. 90 tablet 1  . mupirocin cream (BACTROBAN) 2 % Apply to affected area 1 times daily 30 g 0  . Probiotic Product (PROBIOTIC DAILY PO) Take 1 capsule by mouth daily.      No current  facility-administered medications on file prior to visit.    Current Problems (verified) Patient Active Problem List   Diagnosis Date Noted  . Mild malnutrition ([place]) 01/30/2016  . Encounter for Medicare annual wellness exam 01/30/2016  . Depression, controlled 11/12/2015  . Macular degeneration 01/22/2015  . Urinary retention 07/04/2014  . Vitamin D deficiency 04/19/2014  . Hyperlipidemia 04/19/2014  . Prediabetes 04/19/2014  . Medication management 04/19/2014  . Pulmonary nodule 11/24/2012  . UTI (lower urinary tract infection) 07/27/2012  . Bladder cancer ([place])   . Essential hypertension 10/18/2009  . Atrial fibrillation ([place]) 10/18/2009  . SINUS BRADYCARDIA 10/18/2009    Screening Tests Health Maintenance  Topic Date Due  . PNA vac Low Risk Adult (2 of 2 - PCV13) 10/04/2012  . INFLUENZA VACCINE  07/21/2016  . TETANUS/TDAP  08/16/2023  . ZOSTAVAX  Completed    Immunization History  Administered Date(s) Administered  . Influenza, High Dose Seasonal PF 10/05/2013, 11/12/2015  . Influenza-Unspecified 10/21/2012, 09/26/2014  . Pneumococcal-Unspecified 10/05/2011  . Td 08/15/2013  . Zoster 01/19/2012    Preventative care: Tetanus: 2014 Pneumovax: 2012 Prevnar 13: out of in the office.  Flu  vaccine: 2016 Zostavax: 2013 DEXA: Colonoscopy: 2004 declines another due to age EGD: 2012- Dr. Paulita Fujita CT chest 12/2015, + RUL ill defined mass,and + fractures.  PET 12/2015 + likely RUL mass cancer MGM 04/2014 Echo: 08/2007 normal EF, Dr. Lovena Le ABI legs 2013 Eye Exam: [place] Dentist: None   Past Surgical History  Procedure Laterality Date  . Cholecystectomy    . Partial hip arthroplasty    . Cystoscopy      has yearly due to bladder cancer  . Retinal detachment surgery      at least 3 and have been in both eyes  . Transurethral resection of bladder tumor    . Transurethral resection of prostate  01/18/2013    Procedure: TRANSURETHRAL RESECTION OF THE PROSTATE WITH  GYRUS INSTRUMENTS;  Surgeon: Molli Hazard, MD;  Location: [facility];  Service: Urology;  Laterality: N/A;  CYSTOSCOPY, SUPRAPUBIC TUBE PLACEMENT, CHANNEL TURP, RECTAL EXAM    . Insertion of suprapubic catheter  01/18/2013    Procedure: INSERTION OF SUPRAPUBIC CATHETER;  Surgeon: Molli Hazard, MD;  Location: [facility];  Service: Urology;  Laterality: N/A;  . Cystoscopy  01/18/2013    Procedure: CYSTOSCOPY;  Surgeon: Molli Hazard, MD;  Location: [facility];  Service: Urology;  Laterality: N/A;   Family History  Problem Relation Age of Onset  . Heart disease      No family history  . Pancreatitis Mother   . Alcohol abuse Father     Review of Systems  Constitutional: Positive for malaise/fatigue. Negative for fever, chills, weight loss and diaphoresis.  HENT: Negative.   Respiratory: Negative.   Cardiovascular: Negative.   Gastrointestinal: Positive for constipation. Negative for heartburn, nausea, vomiting, abdominal pain, diarrhea, blood in stool and melena.  Genitourinary: Positive for frequency. Negative for dysuria, urgency, hematuria and flank pain.  Musculoskeletal: Positive for myalgias, back pain, joint pain and falls. Negative for neck pain.  Skin: Negative.   Neurological: Negative.  Negative for weakness.  Endo/Heme/Allergies: Negative.   Psychiatric/Behavioral: Positive for memory loss. Negative for depression, suicidal ideas, hallucinations and substance abuse. The patient is not nervous/anxious and does not have insomnia.     MEDICARE WELLNESS OBJECTIVES: Social History  Substance Use Topics  . Smoking status: Current Every Day Smoker -- 0.25 packs/day for 62 years    Types: Cigarettes  . Smokeless tobacco: None  . Alcohol Use: No   Tobacco use: He does not smoke.  Patient is a former smoker. If yes, counseling given Alcohol Current alcohol use: none Fall risk: High Risk Diet: in general, a "healthy" diet   Physical activity: Current Exercise  Habits:: The patient does not participate in regular exercise at present Cardiac risk factors: Cardiac Risk Factors include: advanced age (>43men, >56 women);dyslipidemia;hypertension;male gender;sedentary lifestyle Depression/mood screen:   Depression screen [facility] 2/9 01/30/2016  Decreased Interest 1  Down, Depressed, Hopeless 1  PHQ - 2 Score 2  Altered sleeping 1  Tired, decreased energy 0  Change in appetite 1  Feeling bad or failure about yourself  0  Trouble concentrating 0  Moving slowly or fidgety/restless 0  Suicidal thoughts 0  PHQ-9 Score 4  Difficult doing work/chores Somewhat difficult    ADLs:  In your present state of health, do you have any difficulty performing the following activities: 01/30/2016 11/12/2015  Hearing? Y N  Vision? Y N  Difficulty concentrating or making decisions? Y N  Walking or climbing stairs? Y N  Dressing or bathing? N N  Doing  errands, shopping? Tempie Donning  Preparing Food and eating ? N -  Using the Toilet? N -  In the past six months, have you accidently leaked urine? N -  Do you have problems with loss of bowel control? N -  Managing your Medications? Y -  Managing your Finances? Y -  Housekeeping or managing your Housekeeping? Y -    Cognitive Testing  Alert? Yes  Normal Appearance?Yes  Oriented to person? Yes  Place? Yes   Time? Yes  Recall of three objects?  Yes  Can perform simple calculations? Yes  Displays appropriate judgment?Yes  Can read the correct time from a watch face?Yes  EOL planning: Does patient have an advance directive?: Yes Type of Advance Directive: [place], Living will Does patient want to make changes to advanced directive?: No - Patient declined Copy of advanced directive(s) in chart?: No - copy requested    Objective:     Blood pressure 110/64, pulse 82, temperature 97.7 F (36.5 C), resp. rate 16, height 5\' 9"  (1.753 m), weight 150 lb 6.4 oz (68.221 kg), SpO2 98 %.  General appearance:  alert, no distress, WD/WN, male HEENT: normocephalic, sclerae anicteric, TMs pearly, nares patent, no discharge or erythema, pharynx normal Oral cavity: MMM, no lesions Neck: supple, no lymphadenopathy, no thyromegaly, no masses Heart: RRR, normal S1, S2 Lungs: CTA bilaterally, no wheezes, rhonchi, or rales Abdomen: +bs, soft, non tender, non distended, with suprapubic catheter Musculoskeletal: nontender, no swelling, no obvious deformity, slowly walks with heavy steps with a walker Extremities: no edema, no cyanosis, no clubbing Pulses: 2+ symmetric, upper and lower extremities, normal cap refill Neurological: alert, oriented x 3, CN2-12 intact, strength normal upper extremities and lower extremities, throughout, no cerebellar signs Psychiatric: normal affect,  very pleasant    Medicare Attestation I have personally reviewed: The patient's medical and social history Their use of alcohol, tobacco or illicit drugs Their current medications and supplements The patient's functional ability including ADLs,fall risks, home safety risks, cognitive, and hearing and visual impairment Diet and physical activities Evidence for depression or mood disorders  The patient's weight, height, BMI, and visual acuity have been recorded in the chart.  I have made referrals, counseling, and provided education to the patient based on review of the above and I have provided the patient with a written personalized care plan for preventive services.     Vicie Mutters, PA-C   01/22/2015

## 2016-01-31 ENCOUNTER — Other Ambulatory Visit: Payer: Self-pay

## 2016-01-31 LAB — HEPATIC FUNCTION PANEL
ALK PHOS: 77 U/L (ref 40–115)
ALT: 11 U/L (ref 9–46)
AST: 19 U/L (ref 10–35)
Albumin: 3.5 g/dL — ABNORMAL LOW (ref 3.6–5.1)
BILIRUBIN DIRECT: 0.1 mg/dL (ref <=0.2)
BILIRUBIN INDIRECT: 0.4 mg/dL (ref 0.2–1.2)
TOTAL PROTEIN: 7.1 g/dL (ref 6.1–8.1)
Total Bilirubin: 0.5 mg/dL (ref 0.2–1.2)

## 2016-01-31 LAB — BASIC METABOLIC PANEL WITH GFR
BUN: 27 mg/dL — ABNORMAL HIGH (ref 7–25)
CALCIUM: 8.8 mg/dL (ref 8.6–10.3)
CHLORIDE: 107 mmol/L (ref 98–110)
CO2: 22 mmol/L (ref 20–31)
Creat: 1.16 mg/dL — ABNORMAL HIGH (ref 0.70–1.11)
GFR, EST NON AFRICAN AMERICAN: 57 mL/min — AB (ref >=60)
GFR, Est African American: 66 mL/min (ref >=60)
GLUCOSE: 74 mg/dL (ref 65–99)
POTASSIUM: 4.4 mmol/L (ref 3.5–5.3)
SODIUM: 139 mmol/L (ref 135–146)

## 2016-01-31 LAB — HEMOGLOBIN A1C
HEMOGLOBIN A1C: 5.7 % — AB (ref <5.7)
MEAN PLASMA GLUCOSE: 117 mg/dL — AB (ref <117)

## 2016-01-31 LAB — LIPID PANEL
CHOL/HDL RATIO: 3 ratio (ref <=5.0)
Cholesterol: 154 mg/dL (ref 125–200)
HDL: 51 mg/dL (ref >=40)
LDL CALC: 85 mg/dL (ref <130)
Triglycerides: 88 mg/dL (ref <150)
VLDL: 18 mg/dL (ref <30)

## 2016-01-31 MED ORDER — PREDNISONE 20 MG PO TABS: ORAL_TABLET | ORAL | Status: AC

## 2016-02-04 DIAGNOSIS — R338 Other retention of urine: Secondary | ICD-10-CM | POA: Diagnosis not present

## 2016-02-06 ENCOUNTER — Other Ambulatory Visit: Payer: Medicare Other

## 2016-02-06 DIAGNOSIS — N390 Urinary tract infection, site not specified: Secondary | ICD-10-CM

## 2016-02-07 LAB — URINALYSIS, MICROSCOPIC ONLY
BACTERIA UA: NONE SEEN [HPF]
CRYSTALS: NONE SEEN [HPF]
Casts: NONE SEEN [LPF]
YEAST: NONE SEEN [HPF]

## 2016-02-07 LAB — URINALYSIS, ROUTINE W REFLEX MICROSCOPIC
Bilirubin Urine: NEGATIVE
Glucose, UA: NEGATIVE
KETONES UR: NEGATIVE
NITRITE: NEGATIVE
PH: 5.5 (ref 5.0–8.0)
Specific Gravity, Urine: 1.02 (ref 1.001–1.035)

## 2016-02-08 ENCOUNTER — Encounter: Payer: Self-pay | Admitting: Physician Assistant

## 2016-02-08 LAB — URINE CULTURE: Colony Count: >=100000

## 2016-02-10 MED ORDER — AMPICILLIN 250 MG PO CAPS: 250.0000 mg | ORAL_CAPSULE | Freq: Four times a day (QID) | ORAL | Status: DC

## 2016-02-10 NOTE — Addendum Note (Signed)
Addended by: Vladimir Crofts on: 02/10/2016 07:31 AM   Modules accepted: Orders

## 2016-02-14 ENCOUNTER — Other Ambulatory Visit: Payer: Self-pay | Admitting: Internal Medicine

## 2016-03-09 ENCOUNTER — Other Ambulatory Visit: Payer: Medicare Other

## 2016-03-09 DIAGNOSIS — N390 Urinary tract infection, site not specified: Secondary | ICD-10-CM | POA: Diagnosis not present

## 2016-03-09 DIAGNOSIS — R319 Hematuria, unspecified: Principal | ICD-10-CM

## 2016-03-10 LAB — URINALYSIS, MICROSCOPIC ONLY
BACTERIA UA: NONE SEEN [HPF]
CASTS: NONE SEEN [LPF]
CRYSTALS: NONE SEEN [HPF]
RBC / HPF: NONE SEEN RBC/HPF (ref <=2)
YEAST: NONE SEEN [HPF]

## 2016-03-10 LAB — URINALYSIS, ROUTINE W REFLEX MICROSCOPIC
BILIRUBIN URINE: NEGATIVE
GLUCOSE, UA: NEGATIVE
Hgb urine dipstick: NEGATIVE
Ketones, ur: NEGATIVE
Nitrite: NEGATIVE
SPECIFIC GRAVITY, URINE: 1.017 (ref 1.001–1.035)
pH: 5.5 (ref 5.0–8.0)

## 2016-03-11 DIAGNOSIS — N139 Obstructive and reflux uropathy, unspecified: Secondary | ICD-10-CM | POA: Diagnosis not present

## 2016-03-11 DIAGNOSIS — N320 Bladder-neck obstruction: Secondary | ICD-10-CM | POA: Diagnosis not present

## 2016-03-12 LAB — URINE CULTURE: Colony Count: >=100000

## 2016-03-12 MED ORDER — AMPICILLIN 250 MG PO CAPS: 250.0000 mg | ORAL_CAPSULE | Freq: Four times a day (QID) | ORAL | Status: AC

## 2016-04-08 DIAGNOSIS — R338 Other retention of urine: Secondary | ICD-10-CM | POA: Diagnosis not present

## 2016-04-16 ENCOUNTER — Other Ambulatory Visit: Payer: Medicare Other

## 2016-04-16 DIAGNOSIS — R300 Dysuria: Secondary | ICD-10-CM

## 2016-04-17 LAB — URINALYSIS, MICROSCOPIC ONLY
CRYSTALS: NONE SEEN [HPF]
Casts: NONE SEEN [LPF]
Yeast: NONE SEEN [HPF]

## 2016-04-17 LAB — URINALYSIS, ROUTINE W REFLEX MICROSCOPIC
Bilirubin Urine: NEGATIVE
Glucose, UA: NEGATIVE
Hgb urine dipstick: NEGATIVE
Ketones, ur: NEGATIVE
NITRITE: NEGATIVE
PH: 5.5 (ref 5.0–8.0)
SPECIFIC GRAVITY, URINE: 1.022 (ref 1.001–1.035)

## 2016-04-19 LAB — URINE CULTURE

## 2016-04-20 ENCOUNTER — Other Ambulatory Visit: Payer: Self-pay | Admitting: Internal Medicine

## 2016-04-20 ENCOUNTER — Encounter: Payer: Self-pay | Admitting: Internal Medicine

## 2016-04-20 MED ORDER — AMOXICILLIN 500 MG PO CAPS: 500.0000 mg | ORAL_CAPSULE | Freq: Three times a day (TID) | ORAL | Status: AC

## 2016-04-22 ENCOUNTER — Observation Stay (HOSPITAL_COMMUNITY)
Admission: EM | Admit: 2016-04-22 | Discharge: 2016-04-23 | Disposition: A | Discharge status: Home / Self Care | Payer: Medicare Other | Attending: Family Medicine | Admitting: Family Medicine

## 2016-04-22 ENCOUNTER — Emergency Department (HOSPITAL_COMMUNITY): Payer: Medicare Other

## 2016-04-22 ENCOUNTER — Encounter (HOSPITAL_COMMUNITY): Payer: Self-pay | Admitting: Emergency Medicine

## 2016-04-22 DIAGNOSIS — R0689 Other abnormalities of breathing: Secondary | ICD-10-CM

## 2016-04-22 DIAGNOSIS — I10 Essential (primary) hypertension: Secondary | ICD-10-CM | POA: Diagnosis not present

## 2016-04-22 DIAGNOSIS — R918 Other nonspecific abnormal finding of lung field: Secondary | ICD-10-CM | POA: Diagnosis not present

## 2016-04-22 DIAGNOSIS — I63439 Cerebral infarction due to embolism of unspecified posterior cerebral artery: Secondary | ICD-10-CM

## 2016-04-22 DIAGNOSIS — R402421 Glasgow coma scale score 9-12, in the field [EMT or ambulance]: Secondary | ICD-10-CM | POA: Diagnosis not present

## 2016-04-22 DIAGNOSIS — R4182 Altered mental status, unspecified: Secondary | ICD-10-CM

## 2016-04-22 DIAGNOSIS — Z79899 Other long term (current) drug therapy: Secondary | ICD-10-CM | POA: Diagnosis not present

## 2016-04-22 DIAGNOSIS — Z66 Do not resuscitate: Secondary | ICD-10-CM

## 2016-04-22 DIAGNOSIS — R55 Syncope and collapse: Secondary | ICD-10-CM | POA: Insufficient documentation

## 2016-04-22 DIAGNOSIS — M8580 Other specified disorders of bone density and structure, unspecified site: Secondary | ICD-10-CM | POA: Insufficient documentation

## 2016-04-22 DIAGNOSIS — R7989 Other specified abnormal findings of blood chemistry: Secondary | ICD-10-CM | POA: Insufficient documentation

## 2016-04-22 DIAGNOSIS — I639 Cerebral infarction, unspecified: Secondary | ICD-10-CM | POA: Diagnosis not present

## 2016-04-22 DIAGNOSIS — F17210 Nicotine dependence, cigarettes, uncomplicated: Secondary | ICD-10-CM | POA: Insufficient documentation

## 2016-04-22 DIAGNOSIS — R0600 Dyspnea, unspecified: Secondary | ICD-10-CM | POA: Insufficient documentation

## 2016-04-22 DIAGNOSIS — Z8551 Personal history of malignant neoplasm of bladder: Secondary | ICD-10-CM | POA: Insufficient documentation

## 2016-04-22 DIAGNOSIS — Z792 Long term (current) use of antibiotics: Secondary | ICD-10-CM | POA: Insufficient documentation

## 2016-04-22 DIAGNOSIS — Z515 Encounter for palliative care: Secondary | ICD-10-CM | POA: Insufficient documentation

## 2016-04-22 DIAGNOSIS — R778 Other specified abnormalities of plasma proteins: Secondary | ICD-10-CM

## 2016-04-22 LAB — URINALYSIS, ROUTINE W REFLEX MICROSCOPIC
Bilirubin Urine: NEGATIVE
GLUCOSE, UA: NEGATIVE mg/dL
KETONES UR: NEGATIVE mg/dL
Nitrite: NEGATIVE
PH: 5.5 (ref 5.0–8.0)
PROTEIN: 100 mg/dL — AB
Specific Gravity, Urine: 1.024 (ref 1.005–1.030)

## 2016-04-22 LAB — COMPREHENSIVE METABOLIC PANEL
ALBUMIN: 3 g/dL — AB (ref 3.5–5.0)
ALK PHOS: 54 U/L (ref 38–126)
ALT: 22 U/L (ref 17–63)
ANION GAP: 10 (ref 5–15)
AST: 34 U/L (ref 15–41)
BILIRUBIN TOTAL: 0.7 mg/dL (ref 0.3–1.2)
BUN: 21 mg/dL — AB (ref 6–20)
CALCIUM: 8.6 mg/dL — AB (ref 8.9–10.3)
CO2: 23 mmol/L (ref 22–32)
Chloride: 107 mmol/L (ref 101–111)
Creatinine, Ser: 1.17 mg/dL (ref 0.61–1.24)
GFR calc Af Amer: >60 mL/min (ref >60)
GFR calc non Af Amer: 54 mL/min — ABNORMAL LOW (ref >60)
GLUCOSE: 94 mg/dL (ref 65–99)
Potassium: 4.4 mmol/L (ref 3.5–5.1)
SODIUM: 140 mmol/L (ref 135–145)
TOTAL PROTEIN: 7.3 g/dL (ref 6.5–8.1)

## 2016-04-22 LAB — URINE MICROSCOPIC-ADD ON

## 2016-04-22 LAB — CBC WITH DIFFERENTIAL/PLATELET
BASOS ABS: 0 10*3/uL (ref 0.0–0.1)
BASOS PCT: 0 %
EOS ABS: 0.4 10*3/uL (ref 0.0–0.7)
Eosinophils Relative: 3 %
HEMATOCRIT: 40.4 % (ref 39.0–52.0)
HEMOGLOBIN: 13.1 g/dL (ref 13.0–17.0)
Lymphocytes Relative: 22 %
Lymphs Abs: 2.5 10*3/uL (ref 0.7–4.0)
MCH: 31.9 pg (ref 26.0–34.0)
MCHC: 32.4 g/dL (ref 30.0–36.0)
MCV: 98.3 fL (ref 78.0–100.0)
MONOS PCT: 6 %
Monocytes Absolute: 0.7 10*3/uL (ref 0.1–1.0)
NEUTROS ABS: 7.9 10*3/uL — AB (ref 1.7–7.7)
NEUTROS PCT: 69 %
Platelets: 280 10*3/uL (ref 150–400)
RBC: 4.11 MIL/uL — AB (ref 4.22–5.81)
RDW: 14.1 % (ref 11.5–15.5)
WBC: 11.5 10*3/uL — AB (ref 4.0–10.5)

## 2016-04-22 LAB — PROTIME-INR
INR: 1.3 (ref 0.00–1.49)
PROTHROMBIN TIME: 15.8 s — AB (ref 11.6–15.2)

## 2016-04-22 LAB — BLOOD GAS, ARTERIAL
ACID-BASE DEFICIT: 1.7 mmol/L (ref 0.0–2.0)
BICARBONATE: 19.2 meq/L — AB (ref 20.0–24.0)
DRAWN BY: 331471
O2 Saturation: 95.6 %
Patient temperature: 98.6
TCO2: 16.8 mmol/L (ref 0–100)
pCO2 arterial: 23.3 mmHg — ABNORMAL LOW (ref 35.0–45.0)
pH, Arterial: 7.527 — ABNORMAL HIGH (ref 7.350–7.450)
pO2, Arterial: 74.7 mmHg — ABNORMAL LOW (ref 80.0–100.0)

## 2016-04-22 LAB — TROPONIN I: Troponin I: 0.13 ng/mL — ABNORMAL HIGH (ref <0.031)

## 2016-04-22 LAB — AMMONIA: Ammonia: <9 umol/L — ABNORMAL LOW (ref 9–35)

## 2016-04-22 LAB — CBG MONITORING, ED: GLUCOSE-CAPILLARY: 79 mg/dL (ref 65–99)

## 2016-04-22 LAB — LACTIC ACID, PLASMA: LACTIC ACID, VENOUS: 1.5 mmol/L (ref 0.5–2.0)

## 2016-04-22 MED ORDER — LORAZEPAM 2 MG/ML IJ SOLN
INTRAMUSCULAR | Status: AC
  Filled 2016-04-22: qty 1

## 2016-04-22 MED ORDER — ASPIRIN 300 MG RE SUPP
300.0000 mg | Freq: Once | RECTAL | Status: DC
  Filled 2016-04-22: qty 1

## 2016-04-22 MED ORDER — LORAZEPAM 2 MG/ML IJ SOLN
1.0000 mg | INTRAMUSCULAR | Status: DC | PRN
  Administered 2016-04-22 – 2016-04-23 (×3): 1 mg via INTRAVENOUS
  Filled 2016-04-22 (×2): qty 1

## 2016-04-22 MED ORDER — HALOPERIDOL 0.5 MG PO TABS
0.5000 mg | ORAL_TABLET | ORAL | Status: DC | PRN
  Filled 2016-04-22: qty 1

## 2016-04-22 MED ORDER — ONDANSETRON HCL 4 MG/2ML IJ SOLN: 4.0000 mg | Freq: Four times a day (QID) | INTRAMUSCULAR | Status: DC | PRN

## 2016-04-22 MED ORDER — GLYCOPYRROLATE 0.2 MG/ML IJ SOLN
0.2000 mg | INTRAMUSCULAR | Status: DC | PRN
  Filled 2016-04-22: qty 1

## 2016-04-22 MED ORDER — POLYVINYL ALCOHOL 1.4 % OP SOLN
1.0000 [drp] | Freq: Four times a day (QID) | OPHTHALMIC | Status: DC | PRN
  Filled 2016-04-22: qty 15

## 2016-04-22 MED ORDER — ACETAMINOPHEN 650 MG RE SUPP: 650.0000 mg | Freq: Four times a day (QID) | RECTAL | Status: DC | PRN

## 2016-04-22 MED ORDER — LORAZEPAM 2 MG/ML PO CONC: 1.0000 mg | ORAL | Status: DC | PRN

## 2016-04-22 MED ORDER — HALOPERIDOL LACTATE 2 MG/ML PO CONC
0.5000 mg | ORAL | Status: DC | PRN
  Filled 2016-04-22: qty 0.3

## 2016-04-22 MED ORDER — LORAZEPAM 1 MG PO TABS: 1.0000 mg | ORAL_TABLET | ORAL | Status: DC | PRN

## 2016-04-22 MED ORDER — ONDANSETRON 4 MG PO TBDP: 4.0000 mg | ORAL_TABLET | Freq: Four times a day (QID) | ORAL | Status: DC | PRN

## 2016-04-22 MED ORDER — HALOPERIDOL LACTATE 5 MG/ML IJ SOLN
0.5000 mg | INTRAMUSCULAR | Status: DC | PRN
  Administered 2016-04-22 – 2016-04-23 (×2): 0.5 mg via INTRAVENOUS
  Filled 2016-04-22 (×2): qty 1

## 2016-04-22 MED ORDER — HALOPERIDOL LACTATE 5 MG/ML IJ SOLN
2.5000 mg | Freq: Once | INTRAMUSCULAR | Status: DC
  Filled 2016-04-22: qty 1

## 2016-04-22 MED ORDER — MORPHINE SULFATE (CONCENTRATE) 10 MG/0.5ML PO SOLN
5.0000 mg | ORAL | Status: DC | PRN
  Filled 2016-04-22: qty 0.5

## 2016-04-22 MED ORDER — BIOTENE DRY MOUTH MT LIQD: 15.0000 mL | OROMUCOSAL | Status: DC | PRN

## 2016-04-22 MED ORDER — HALOPERIDOL LACTATE 5 MG/ML IJ SOLN
INTRAMUSCULAR | Status: AC
  Administered 2016-04-23: 0.5 mg via INTRAVENOUS
  Filled 2016-04-22: qty 1

## 2016-04-22 MED ORDER — ACETAMINOPHEN 325 MG PO TABS
650.0000 mg | ORAL_TABLET | Freq: Four times a day (QID) | ORAL | Status: DC | PRN
Start: 2016-04-22 — End: 2016-04-23

## 2016-04-22 MED ORDER — MORPHINE SULFATE (CONCENTRATE) 10 MG/0.5ML PO SOLN
5.0000 mg | ORAL | Status: DC | PRN
Start: 2016-04-22 — End: 2016-04-23
  Administered 2016-04-22 – 2016-04-23 (×4): 5 mg via SUBLINGUAL
  Filled 2016-04-22 (×3): qty 0.5

## 2016-04-22 MED ORDER — GLYCOPYRROLATE 1 MG PO TABS
1.0000 mg | ORAL_TABLET | ORAL | Status: DC | PRN
  Filled 2016-04-22: qty 1

## 2016-04-22 NOTE — Progress Notes (Signed)
[facility] consulted for home hospice needs.  Patient presents to ED after being found unresponsive at home CT of head Multifocal areas of acute nonhemorrhagic infarction affect the LEFT basal ganglia and regional white matter LEFT temporal, frontal, and parietal lobes consistent with a LEFT ICA or LEFT MCA territory insult.  Chest xray revealed pneumonia per admitting MD.  Poor prognosis per admitting MD.  [facility] and admitting MD spoke to family regarding plan of care.  Patient's daughter Jackelyn Poling is patient's healthcare POA [phone].  [name] reports the patient has been living with her.  Patient's son, another sister and grand daughter also present.  Family refusing home hospice at this time.  [name] reports the patient does not have any equipment at home and will be difficult to care for the patient in his current state of health.  [name] reports patient has been a patient of [facility] in the past.  EDCM provided patient's daughter with list of home hospice agencies and hospice facilities.  Patient's daughter Jackelyn Poling has chosen [facility] as she was very happy with the care they provided for her mother.  Also interested in [facility].  Support offered to patient's family.  No further EDCM needs at this time.  [name] reports to please call her with any change in patient's condition [phone].

## 2016-04-22 NOTE — ED Notes (Signed)
Per [facility] family states patient in unresponsive and has change unconsciousness. LKW was at bedtime last night. Has suprapubic cath being treated for UTI. questionable right sided weakness.    .5 NARCAN in route no response

## 2016-04-22 NOTE — ED Notes (Signed)
Patient transported to X-ray 

## 2016-04-22 NOTE — H&P (Signed)
Brent Day.  [id number] DOB: July 25, 1927 DOA: 04/22/2016 PCP: Alesia Richards, MD  Outpatient Specialists:    Brief Narrative:  67 ? Bladder cancer/prostate cancer (high-risk multifocal Gleason 9 --palliative treatment with intermittent androgen deprivation since 2013 treated by Dr. Clista Bernhardt) + suprapubic catheter Atrial fibrillation followed by Dr. Lovena Le, [place] score >4 Prior urinary infection Klebsiella oxytoca 7 2015 Bladder cancer 1998 treated with cystoscopy without recurrence History of GI bleed 04/26/1999 for diverticulosis as per Dr. Oletta Lamas Retina status post vitrectomy 10/26/2007   Last nl according to family 04/21/2016 wherein patient was able to talk about the stock market and other issues and was completely oriented however had been sick over the past week Family found him unresponsive at home he was given Narcan and was unable to give history GCS of 5 on admission to the ED   BUN/creatinine 21/1.17 Troponin point of care what 0.13 Lactic acid 1.5 WBC 11.5, hemoglobin 13.1, platelet 280 INR 1.3  CT head showed multifocal nonacute hemorrhagic infarctions left basal ganglia temporal frontal parietal lobes as well as patchy left upper lobe opacity suspicious for aspiration pneumonia    Assessment & Plan:   Active Problems:   CVA (cerebral infarction)  I had a 20 minute discussion with multiple family members including health care power of attorney as well as 2 daughters and the patient's son Patient is DO NOT RESUSCITATE and was completely functional prior to this admission although he had underlying multiple chronic illnesses Family is realistic and does understand that he is probably not going to recover from this at all and he is in fact having periods of apnea at the bedside with minimal arousals of numbness We will admit him to inpatient setting as patient's family cannot manage him at home currently given his debilitated state and the fact  that he lives alone Palliative care consult to see him in the morning if he is still alive at that time however I suspect he will ultimately die overnight 04/22/2016 Hospice order set utilized with Ativan and Roxanol and other comfort measures    Comfort measures, no need for anticoagulation DO NOT RESUSCITATE Prognosis extremely guarded Expect demise in 24-48 hours  Consultants:   Palliative care  Objective: Filed Vitals:   04/22/16 1538 04/22/16 1607 04/22/16 1715 04/22/16 1751  BP:  149/72 127/50 129/63  Pulse:  74 70 64  Temp:  99 F (37.2 C)    Resp:  15 15 33  SpO2: 100% 96% 98% 96%    Intake/Output Summary (Last 24 hours) at 04/22/16 1803 Last data filed at 04/22/16 1606  Gross per 24 hour  Intake      0 ml  Output    200 ml  Net   -200 ml   There were no vitals filed for this visit.  Examination:  Lethargic and arouses only to pain Some tracheal and mouth deviation Dry mouth and xerostomia Coarse crepitations bilaterally irregularly irregular heartbeat Unable to follow commands to assist with exam Moving limbs purposelessly but does open eyes at times    Data Reviewed: I have personally reviewed following labs and imaging studies  CBC:  Recent Labs Lab 04/22/16 1600  WBC 11.5*  NEUTROABS 7.9*  HGB 13.1  HCT 40.4  MCV 98.3  PLT 123456   Basic Metabolic Panel:  Recent Labs Lab 04/22/16 1600  NA 140  K 4.4  CL 107  CO2 23  GLUCOSE 94  BUN 21*  CREATININE 1.17  CALCIUM 8.6*   GFR: CrCl cannot be calculated (Unknown ideal weight.). Liver Function Tests:  Recent Labs Lab 04/22/16 1600  AST 34  ALT 22  ALKPHOS 54  BILITOT 0.7  PROT 7.3  ALBUMIN 3.0*   No results for input(s): LIPASE, AMYLASE in the last 168 hours. No results for input(s): AMMONIA in the last 168 hours. Coagulation Profile:  Recent Labs Lab 04/22/16 1600  INR 1.30   Cardiac Enzymes:  Recent Labs Lab 04/22/16 1600  TROPONINI 0.13*   BNP (last 3  results) No results for input(s): PROBNP in the last 8760 hours. HbA1C: No results for input(s): HGBA1C in the last 72 hours. CBG:  Recent Labs Lab 04/22/16 1538  GLUCAP 79   Lipid Profile: No results for input(s): CHOL, HDL, LDLCALC, TRIG, CHOLHDL, LDLDIRECT in the last 72 hours. Thyroid Function Tests: No results for input(s): TSH, T4TOTAL, FREET4, T3FREE, THYROIDAB in the last 72 hours. Anemia Panel: No results for input(s): VITAMINB12, FOLATE, FERRITIN, TIBC, IRON, RETICCTPCT in the last 72 hours. Urine analysis:    Component Value Date/Time   COLORURINE AMBER* 04/22/2016 1614   APPEARANCEUR CLOUDY* 04/22/2016 1614   LABSPEC 1.024 04/22/2016 1614   PHURINE 5.5 04/22/2016 1614   GLUCOSEU NEGATIVE 04/22/2016 1614   HGBUR TRACE* 04/22/2016 1614   BILIRUBINUR NEGATIVE 04/22/2016 1614   KETONESUR NEGATIVE 04/22/2016 1614   PROTEINUR 100* 04/22/2016 1614   UROBILINOGEN 0.2 06/13/2015 1654   NITRITE NEGATIVE 04/22/2016 1614   LEUKOCYTESUR SMALL* 04/22/2016 1614   Sepsis Labs: @LABRCNTIP (procalcitonin:4,lacticidven:4)  ) Recent Results (from the past 240 hour(s))  Urine culture     Status: None   Collection Time: 04/16/16  2:54 PM  Result Value Ref Range Status   Culture ENTEROCOCCUS SPECIES  Final    Comment: SOURCE: URINE&URINE   Colony Count >=100,000 COLONIES/ML  Final   Organism ID, Bacteria ENTEROCOCCUS SPECIES  Final      Susceptibility   Enterococcus species -  (no method available)    AMPICILLIN <=2 Sensitive     LEVOFLOXACIN >=8 Resistant     NITROFURANTOIN <=16 Sensitive     VANCOMYCIN 1 Sensitive     TETRACYCLINE >=16 Resistant          Radiology Studies: Dg Chest 1 View  04/22/2016  CLINICAL DATA:  Cough, apnea, altered mental status, prostate cancer EXAM: CHEST 1 VIEW COMPARISON:  PET-CT dated 01/21/2016 FINDINGS: Patchy/nodular opacity in the medial right upper lobe, likely reflecting tumor when corresponding with prior PET-CT. Patchy left upper  lobe opacity, new, suspicious for pneumonia. Tumor is considered unlikely. Additional mild patchy left lower lobe opacity, atelectasis versus pneumonia. Cardiomegaly. IMPRESSION: Patchy left upper lobe opacity, suspicious for pneumonia, less likely tumor. Mild patchy left lower lobe opacity, atelectasis versus pneumonia. Focal patchy/ nodular opacity in the medial right upper lobe, likely reflecting tumor when corresponding with prior PET-CT. Electronically Signed   By: Julian Hy M.D.   On: 04/22/2016 16:47   Ct Head Wo Contrast  04/22/2016  CLINICAL DATA:  Altered level of consciousness. RIGHT-sided weakness. Patient last seen normal at bed time yesterday. EXAM: CT HEAD WITHOUT CONTRAST TECHNIQUE: Contiguous axial images were obtained from the base of the skull through the vertex without intravenous contrast. COMPARISON:  CT head 07/02/2014. FINDINGS: Large area of hypoattenuation affects the LEFT basal ganglia and regional white matter consistent with acute nonhemorrhagic infarction. Similar larger area of hypoattenuation affects the LEFT lateral and anterior temporal lobe. Patchy, areas of cortical hypoattenuation are seen involving the  LEFT parietal and LEFT frontal cortex, non confluent. Baseline atrophy with chronic microvascular ischemic change. No definite CT signs of large vessel occlusion, although vascular calcification in the carotid siphons is present. A nasal airway is present. The calvarium is intact. No sinus or mastoid disease. BILATERAL cataract extraction. IMPRESSION: Multifocal areas of acute nonhemorrhagic infarction affect the LEFT basal ganglia and regional white matter, LEFT temporal, frontal, and parietal lobes consistent with a LEFT ICA or LEFT MCA territory insult. Atrophy and small vessel disease. Electronically Signed   By: Staci Righter M.D.   On: 04/22/2016 16:32        Scheduled Meds: . aspirin  300 mg Rectal Once   Continuous Infusions:       Time spent:  [place], MD [facility] ([facility]   If 7PM-7AM, please contact night-coverage [email or web address] Password [email or web address] 04/22/2016, 6:03 PM

## 2016-04-22 NOTE — ED Notes (Signed)
Bed: WA14 Expected date:  Expected time:  Means of arrival:  Comments: 

## 2016-04-22 NOTE — ED Notes (Signed)
His family report that pt. Is a bit agitated--will give IV Ativan asap.

## 2016-04-22 NOTE — ED Notes (Signed)
Patient transported to CT 

## 2016-04-22 NOTE — ED Provider Notes (Signed)
CSN: [id number]     Arrival date & time 04/22/16  1533 History   First MD Initiated Contact with Patient 04/22/16 1534     Chief Complaint  Patient presents with  . Altered Mental Status  . Loss of Consciousness  PT LIVES AT HOME BY HIMSELF, BUT FAMILY CHECKS ON HIM DAILY.  HE IS NORMALLY ALERT AND TALKS AND WALKS.  TODAY, FAMILY FOUND HIM UNRESPONSIVE.  HE WAS LAST SEEN NORMAL LAST NIGHT AT BED TIME.  THEY CALLED EMS WHO GAVE HIM NARCAN W/O ANY CHANGE IN MS.  PT IS A DNR.  PT IS UNABLE TO GIVE ANY HX.   (Consider location/radiation/quality/duration/timing/severity/associated sxs/prior Treatment) Patient is a 82 y.o. male presenting with altered mental status and syncope. The history is provided by the EMS personnel. The history is limited by the condition of the patient.  Altered Mental Status Presenting symptoms: unresponsiveness   Loss of Consciousness   Past Medical History  Diagnosis Date  . Pancreatitis   . Fractured hip ([place]) 09/2011  . Prostate tumor     to have bx by Dr. Jasmine December  . UTI (lower urinary tract infection) 06/13/2012    has had x 3 months  . Falls 06/13/12    hit head approx. 1 month ago  . Unsteady gait 06/13/2012    "bad for awhile but getting worse recently"  . Slurred speech     slurred speech on 06/12/12 - didn't last long, slower talking recently  . Cancer ([place])     bladder  . Lung abnormality     spot on ct approx 3 months ago - to be followed - [facility]  . 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 Nose fracture     after a fall - Oct. 2012  . Hypertension   . Dysrhythmia     history of a-fib  - followed by Dr. Lovena Le   . Osteopenia   . Fractures, stress     spine  . 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 Hallucinations     will hospitalized -  . Foley catheter in place   . Anxiety   . Ulcer of ankle ([place])     rt  . Atrial fibrillation ([place])   . Urinary retention 07/04/2014   Past Surgical History    Procedure Laterality Date  . Cholecystectomy    . Partial hip arthroplasty    . Cystoscopy      has yearly due to bladder cancer  . Retinal detachment surgery      at least 3 and have been in both eyes  . Transurethral resection of bladder tumor    . Transurethral resection of prostate  01/18/2013    Procedure: TRANSURETHRAL RESECTION OF THE PROSTATE WITH GYRUS INSTRUMENTS;  Surgeon: Molli Hazard, MD;  Location: [facility];  Service: Urology;  Laterality: N/A;  CYSTOSCOPY, SUPRAPUBIC TUBE PLACEMENT, CHANNEL TURP, RECTAL EXAM    . Insertion of suprapubic catheter  01/18/2013    Procedure: INSERTION OF SUPRAPUBIC CATHETER;  Surgeon: Molli Hazard, MD;  Location: [facility];  Service: Urology;  Laterality: N/A;  . Cystoscopy  01/18/2013    Procedure: CYSTOSCOPY;  Surgeon: Molli Hazard, MD;  Location: [facility];  Service: Urology;  Laterality: N/A;   Family History  Problem Relation Age of Onset  . Heart disease      No family history  . Pancreatitis Mother   . Alcohol abuse  Father    Social History  Substance Use Topics  . Smoking status: Current Every Day Smoker -- 0.25 packs/day for 62 years    Types: Cigarettes  . Smokeless tobacco: None  . Alcohol Use: No    Review of Systems  Unable to perform ROS: Patient unresponsive  Cardiovascular: Positive for syncope.      Allergies  Review of patient's allergies indicates no known allergies.  Home Medications   Prior to Admission medications   Medication Sig Start Date End Date Taking? Authorizing Provider  amoxicillin (AMOXIL) 500 MG capsule Take 1 capsule (500 mg total) by mouth 3 (three) times daily. 04/20/16  Yes Courtney Forcucci, PA-C  Ascorbic Acid (VITAMIN C) 1000 MG tablet Take 1,000 mg by mouth 3 (three) times daily.   Yes Historical Provider, MD  b complex vitamins tablet Take 1 tablet by mouth daily.   Yes Historical Provider, MD  Cholecalciferol (VITAMIN D3) 2000 UNITS capsule Take 4,000 Units by  mouth daily.    Yes Historical Provider, MD  citalopram (CELEXA) 40 MG tablet TAKE 1 TABLET ONCE DAILY. 02/14/16  Yes Unk Pinto, MD  docusate sodium (COLACE) 100 MG capsule Take 200 mg by mouth 2 (two) times daily.   Yes Historical Provider, MD  Ensure Plus (ENSURE PLUS) LIQD Take 237 mLs by mouth 2 (two) times daily between meals.   Yes Historical Provider, MD  LORazepam (ATIVAN) 1 MG tablet TAKE 1/2 TO 1 TABLET THREE TIMES DAILY AS NEEDED. 11/27/15  Yes Unk Pinto, MD  mirtazapine (REMERON) 30 MG tablet TAKE ONE TABLET AT BEDTIME. Patient taking differently: TAKE 15mg s AT BEDTIME daily 08/24/15  Yes Unk Pinto, MD  nitroGLYCERIN (NITROSTAT) 0.4 MG SL tablet Place 1 tablet (0.4 mg total) under the tongue every 5 (five) minutes as needed for chest pain. 01/30/16 01/29/17 Yes Vicie Mutters, PA-C  oxybutynin (DITROPAN) 5 MG tablet Take 5 mg by mouth 4 (four) times daily as needed for bladder spasms.  04/08/16  Yes Historical Provider, MD  Probiotic Product (PROBIOTIC DAILY PO) Take 1 capsule by mouth daily.    Yes Historical Provider, MD  triamcinolone cream (KENALOG) 0.1 % Apply 1 application topically 2 (two) times daily. 01/30/16  Yes Vicie Mutters, PA-C  ampicillin (PRINCIPEN) 250 MG capsule Take 1 capsule (250 mg total) by mouth 4 (four) times daily. Patient not taking: Reported on 04/22/2016 03/12/16   Vicie Mutters, PA-C   BP 129/63 mmHg  Pulse 64  Temp(Src) 99 F (37.2 C)  Resp 33  SpO2 96% Physical Exam  Constitutional: He appears well-developed and well-nourished.  HENT:  Head: Normocephalic and atraumatic.  Right Ear: External ear normal.  Left Ear: External ear normal.  Mouth/Throat: Mucous membranes are dry.  Eyes: Conjunctivae are normal. Pupils are equal, round, and reactive to light.  Neck: Normal range of motion. Neck supple.  Cardiovascular: Normal rate.  An irregular rhythm present.  Pulmonary/Chest: Breath sounds normal.  Abdominal: Soft. Bowel sounds are normal.     Neurological: He is unresponsive.  Skin: Skin is warm and dry.  Nursing note and vitals reviewed.   ED Course  Procedures (including critical care time) Labs Review Labs Reviewed  CBC WITH DIFFERENTIAL/PLATELET - Abnormal; Notable for the following:    WBC 11.5 (*)    RBC 4.11 (*)    Neutro Abs 7.9 (*)    All other components within normal limits  COMPREHENSIVE METABOLIC PANEL - Abnormal; Notable for the following:    BUN 21 (*)  Calcium 8.6 (*)    Albumin 3.0 (*)    GFR calc non Af Amer 54 (*)    All other components within normal limits  URINALYSIS, ROUTINE W REFLEX MICROSCOPIC (NOT AT [facility]) - Abnormal; Notable for the following:    Color, Urine AMBER (*)    APPearance CLOUDY (*)    Hgb urine dipstick TRACE (*)    Protein, ur 100 (*)    Leukocytes, UA SMALL (*)    All other components within normal limits  AMMONIA - Abnormal; Notable for the following:    Ammonia <9 (*)    All other components within normal limits  TROPONIN I - Abnormal; Notable for the following:    Troponin I 0.13 (*)    All other components within normal limits  PROTIME-INR - Abnormal; Notable for the following:    Prothrombin Time 15.8 (*)    All other components within normal limits  BLOOD GAS, ARTERIAL - Abnormal; Notable for the following:    pH, Arterial 7.527 (*)    pCO2 arterial 23.3 (*)    pO2, Arterial 74.7 (*)    Bicarbonate 19.2 (*)    All other components within normal limits  URINE MICROSCOPIC-ADD ON - Abnormal; Notable for the following:    Squamous Epithelial / LPF 6-30 (*)    Bacteria, UA FEW (*)    Casts HYALINE CASTS (*)    All other components within normal limits  CULTURE, BLOOD (ROUTINE X 2)  CULTURE, BLOOD (ROUTINE X 2)  URINE CULTURE  LACTIC ACID, PLASMA  CBG MONITORING, ED    Imaging Review Dg Chest 1 View  04/22/2016  CLINICAL DATA:  Cough, apnea, altered mental status, prostate cancer EXAM: CHEST 1 VIEW COMPARISON:  PET-CT dated 01/21/2016 FINDINGS:  Patchy/nodular opacity in the medial right upper lobe, likely reflecting tumor when corresponding with prior PET-CT. Patchy left upper lobe opacity, new, suspicious for pneumonia. Tumor is considered unlikely. Additional mild patchy left lower lobe opacity, atelectasis versus pneumonia. Cardiomegaly. IMPRESSION: Patchy left upper lobe opacity, suspicious for pneumonia, less likely tumor. Mild patchy left lower lobe opacity, atelectasis versus pneumonia. Focal patchy/ nodular opacity in the medial right upper lobe, likely reflecting tumor when corresponding with prior PET-CT. Electronically Signed   By: Julian Hy M.D.   On: 04/22/2016 16:47   Ct Head Wo Contrast  04/22/2016  CLINICAL DATA:  Altered level of consciousness. RIGHT-sided weakness. Patient last seen normal at bed time yesterday. EXAM: CT HEAD WITHOUT CONTRAST TECHNIQUE: Contiguous axial images were obtained from the base of the skull through the vertex without intravenous contrast. COMPARISON:  CT head 07/02/2014. FINDINGS: Large area of hypoattenuation affects the LEFT basal ganglia and regional white matter consistent with acute nonhemorrhagic infarction. Similar larger area of hypoattenuation affects the LEFT lateral and anterior temporal lobe. Patchy, areas of cortical hypoattenuation are seen involving the LEFT parietal and LEFT frontal cortex, non confluent. Baseline atrophy with chronic microvascular ischemic change. No definite CT signs of large vessel occlusion, although vascular calcification in the carotid siphons is present. A nasal airway is present. The calvarium is intact. No sinus or mastoid disease. BILATERAL cataract extraction. IMPRESSION: Multifocal areas of acute nonhemorrhagic infarction affect the LEFT basal ganglia and regional white matter, LEFT temporal, frontal, and parietal lobes consistent with a LEFT ICA or LEFT MCA territory insult. Atrophy and small vessel disease. Electronically Signed   By: Staci Righter M.D.    On: 04/22/2016 16:32   I have personally reviewed and evaluated  these images and lab results as part of my medical decision-making.   EKG Interpretation   Date/Time:  Wednesday Apr 22 2016 15:38:35 EDT Ventricular Rate:  97 PR Interval:  175 QRS Duration: 102 QT Interval:  400 QTC Calculation: 508 R Axis:   64 Text Interpretation:  Sinus rhythm Paired ventricular premature complexes  Repol abnrm suggests ischemia, anterolateral Confirmed by Andreu Drudge MD,  Aerionna Moravek ([id number]) on 04/22/2016 4:10:20 PM      MDM  PT IS STILL MINIMALLY RESPONSIVE, BUT WILL SQUEEZE A DAUGHTER'S HAND WITH HIS LEFT HAND AND BRIEFLY OPEN EYES.   I SPOKE WITH 2 OF PT'S DAUGHTERS (1 IS HC POA) AND THEY DO NOT WANT TO BE AGGRESSIVE WITH TX.  THEY WANT PALLIATIVE CARE AND HOSPICE.  THEY WANT TO KEEP PT DNR.  THEY KNOW PROGNOSIS IS POOR.  THEY KNOW THEIR FATHER WOULD NOT WANT TO BE AGGRESSIVE AS WELL.   PT D/W DR. Verlon Au WHO WILL ADMIT PT FOR PALLIATIVE CARE.  THE PT WAS ALSO D/W CASE MANAGEMENT ([facility] FERRERO) WHO WILL CONTACT HOSPICE.  Final diagnoses:  Acute CVA (cerebrovascular accident) ([place])  Elevated troponin  DNR no code (do not resuscitate)        Isla Pence, MD 04/22/16 2257

## 2016-04-22 NOTE — ED Notes (Signed)
I have attempted to phone report--will try again asap.

## 2016-04-22 NOTE — ED Notes (Signed)
unintentional hand movements and grab items with right hand. Left hand with little movement same as right. Pt coughs intermittently with shallow breathing. Moments of apnea.

## 2016-04-22 NOTE — Clinical Social Work Note (Signed)
Clinical Social Work Assessment  Patient Details  Name: Brent Day. MRN: [id number] Date of Birth: 1927/11/11  Date of referral:  04/22/16               Reason for consult:   (Family is interested in hospice information.)                Permission sought to share information with:   (None.) Permission granted to share information::  No  Name::        Agency::     Relationship::     Contact Information:     Housing/Transportation Living arrangements for the past 2 months:  Single Family Home (Family states pt lives home with his sister in [place].) Source of Information:   (POA/ Lenda Kelp [phone]) Patient Interpreter Needed:  None Criminal Activity/Legal Involvement Pertinent to Current Situation/Hospitalization:    Significant Relationships:   (Family appears to be supportive.) Lives with:  Siblings Do you feel safe going back to the place where you live?    Need for family participation in patient care:   (Family is present at bedside and appears to be supportive.)  Care giving concerns:  Family is interested in hospice care for patient. Family states that they are interested in a facility for patient. CSW provided family with a list of facilities.   Social Worker assessment / plan:  CSW met with family at bedside. Patient was present. However, he was not able to communicate. POA informed CSW that pt lives at home in [place] with her sister. Family states that the patient's care giver who is also a family member has been coming to assist pt with ADL's.  Family appears to be supportive. Family is interested in [facility] for hospice.  Employment status:  Retired Forensic scientist:   Production designer, theatre/television/film.) PT Recommendations:  Not assessed at this time Information / Referral to community resources:   (CSW provided family information regarding various hospice facilities. )  Patient/Family's Response to care:  Family is aware that pt will  be admitted and are accepting at this time.  Patient/Family's Understanding of and Emotional Response to Diagnosis, Current Treatment, and Prognosis:  Family have no questions for CSW at this time. CSW informed family to inform nurse if they would like to speak with CSW at any time.  Emotional Assessment Appearance:  Other (Comment Required (Gasping for air) Attitude/Demeanor/Rapport:   (Patient was not communicative. Patient was gasping to catch his breath at bedside.) Affect (typically observed):    Orientation:   (Unable to assess.) Alcohol / Substance use:  Not Applicable Psych involvement (Current and /or in the community):  No (Comment)  Discharge Needs  Concerns to be addressed:  Adjustment to Illness Readmission within the last 30 days:  No Current discharge risk:  None Barriers to Discharge:  No Barriers Identified   Bernita Buffy, LCSW 04/22/2016, 9:43 PM

## 2016-04-23 DIAGNOSIS — R0689 Other abnormalities of breathing: Secondary | ICD-10-CM | POA: Diagnosis not present

## 2016-04-23 DIAGNOSIS — I63439 Cerebral infarction due to embolism of unspecified posterior cerebral artery: Secondary | ICD-10-CM | POA: Diagnosis not present

## 2016-04-23 DIAGNOSIS — I639 Cerebral infarction, unspecified: Secondary | ICD-10-CM | POA: Insufficient documentation

## 2016-04-23 DIAGNOSIS — Z515 Encounter for palliative care: Secondary | ICD-10-CM | POA: Diagnosis not present

## 2016-04-23 DIAGNOSIS — R0600 Dyspnea, unspecified: Secondary | ICD-10-CM

## 2016-04-23 MED ORDER — FENTANYL CITRATE (PF) 100 MCG/2ML IJ SOLN: 100.0000 ug | INTRAMUSCULAR | Status: DC | PRN

## 2016-04-23 MED ORDER — SODIUM CHLORIDE 0.9 % IV SOLN
4.0000 mg/h | INTRAVENOUS | Status: DC
  Administered 2016-04-23: 3 mg/h via INTRAVENOUS
  Filled 2016-04-23: qty 10

## 2016-04-23 MED ORDER — MORPHINE SULFATE (CONCENTRATE) 10 MG/0.5ML PO SOLN
10.0000 mg | ORAL | Status: DC | PRN
  Administered 2016-04-23: 10 mg via SUBLINGUAL
  Filled 2016-04-23: qty 0.5

## 2016-04-23 MED ORDER — FENTANYL CITRATE (PF) 100 MCG/2ML IJ SOLN
100.0000 ug | INTRAMUSCULAR | Status: DC | PRN
  Administered 2016-04-23: 75 ug via INTRAVENOUS
  Administered 2016-04-23 (×2): 100 ug via INTRAVENOUS
  Filled 2016-04-23 (×2): qty 2

## 2016-04-23 MED ORDER — LORAZEPAM 2 MG/ML IJ SOLN: 2.0000 mg | INTRAMUSCULAR | Status: DC | PRN

## 2016-04-23 MED ORDER — MORPHINE SULFATE (PF) 2 MG/ML IV SOLN: 2.0000 mg | INTRAVENOUS | Status: DC | PRN

## 2016-04-23 MED ORDER — MORPHINE BOLUS VIA INFUSION
4.0000 mg | INTRAVENOUS | Status: DC | PRN
  Administered 2016-04-23 (×2): 4 mg via INTRAVENOUS
  Filled 2016-04-23 (×3): qty 4

## 2016-04-23 MED ORDER — MORPHINE SULFATE (PF) 4 MG/ML IV SOLN
4.0000 mg | INTRAVENOUS | Status: AC | PRN
Start: 2016-04-23 — End: 2016-04-23

## 2016-04-23 MED ORDER — HALOPERIDOL LACTATE 5 MG/ML IJ SOLN
2.0000 mg | INTRAMUSCULAR | Status: DC | PRN
  Administered 2016-04-23: 4 mg via INTRAVENOUS

## 2016-04-23 MED ORDER — MORPHINE BOLUS VIA INFUSION
2.0000 mg | INTRAVENOUS | Status: DC | PRN
  Filled 2016-04-23: qty 4

## 2016-04-23 MED ORDER — LORAZEPAM 2 MG/ML PO CONC: 2.0000 mg | ORAL | Status: DC | PRN

## 2016-04-23 MED ORDER — MORPHINE SULFATE (PF) 2 MG/ML IV SOLN
2.0000 mg | INTRAVENOUS | Status: DC | PRN
  Administered 2016-04-23: 2 mg via INTRAVENOUS
  Filled 2016-04-23: qty 1

## 2016-04-23 MED ORDER — FENTANYL CITRATE (PF) 100 MCG/2ML IJ SOLN
75.0000 ug | INTRAMUSCULAR | Status: DC | PRN
  Filled 2016-04-23: qty 2

## 2016-04-24 LAB — URINE CULTURE
Culture: 4000 — AB
Special Requests: NORMAL

## 2016-04-27 LAB — CULTURE, BLOOD (ROUTINE X 2)
CULTURE: NO GROWTH
CULTURE: NO GROWTH

## 2016-04-29 ENCOUNTER — Ambulatory Visit: Payer: Self-pay | Admitting: Internal Medicine

## 2016-05-21 NOTE — Progress Notes (Signed)
Called to room by patients family, patient was with out pulse or respirations. Time of death 04-15-29 verified with Aldean Baker RN.

## 2016-05-21 NOTE — Progress Notes (Signed)
Nutrition Brief Note  Pt identified as at nutrition risk on the Malnutrition Screen Tool  Chart reviewed. Pt now transitioning to comfort care.  No nutrition interventions warranted at this time.  Please consult as needed.   Vlad Mayberry, MS, RD, LDN Pager: [phone] After Hours Pager: [phone]    

## 2016-05-21 NOTE — Discharge Summary (Addendum)
.   Death Summary  Brent Day. [id number] DOB: 27-Oct-1927 DOA: 05/21/2016  PCP: Alesia Richards, MD PCP/Office notified: yes  Admit date: 05/21/16 Date of Death: 22-May-2016  Final Diagnoses:  Active Problems:   CVA (cerebral infarction) Aspiration pneumonia History of prostate cancer     History of present illness:   42 ? Bladder cancer/prostate cancer (high-risk multifocal Gleason 9 --palliative treatment with intermittent androgen deprivation since 2013 treated by Dr. Clista Bernhardt) + suprapubic catheter Atrial fibrillation followed by Dr. Lovena Le, [place] score >4 Prior urinary infection Klebsiella oxytoca 7 2015 Bladder cancer 1998 treated with cystoscopy without recurrence History of GI bleed 04/26/1999 for diverticulosis as per Dr. Oletta Lamas Retina status post vitrectomy 10/26/2007   Last nl according to family 04/21/2016 wherein patient was able to talk about the stock market and other issues and was completely oriented however had been sick over the past week Family found him unresponsive at home he was given Narcan and was unable to give history GCS of 5 on admission to the ED   BUN/creatinine 21/1.17 Troponin point of care what 0.13 Lactic acid 1.5 WBC 11.5, hemoglobin 13.1, platelet 280 INR 1.3  Chest x-ray suggestive of aspiration and patient febrile and short of breath on admission  Hospital Course:  The patient was admitted under the hospice care orders given morphine as well as Ativan glycopyrrolate and comfort measures were pursued Patient ultimately passed at 1730 May 22, 2016 Palliative care was consulted during hospitalization to assist with end-of-life symptom management  Time:  35  Signed:  Nita Sells  [facility] 05/22/16, 5:52 PM

## 2016-05-21 NOTE — Consult Note (Signed)
Consultation Note Date: 05-17-2016   Patient Name: Brent Day.  DOB: 01-23-1927  MRN: [id number]  Age / Sex: 82 y.o., male  PCP: Unk Pinto, MD Referring Physician: Nita Sells, MD  Reason for Consultation: Establishing goals of care, Non pain symptom management, Pain control and Terminal Care  HPI/Patient Profile: 82 y.o. male  with past medical history of prostate and bladder cancer, suprapubic catheter, recurrent UTI admitted on 04/22/2016 with CVA and likely aspiration pneumonia and appears to be actively dying.   Clinical Assessment and Goals of Care: I met today first with daughter Brent Day at bedside but numerous other family members join [place] as I am working to make Brent Day more comfortably. Upon entering his room Brent Day is sideways and down in the bed and gasping for air, extreme dyspnea, and extremely tachypneic 40+ breathes per minute. Worked with RN Jenny Reichmann to give sublingual medications (IV lost) and then multiple doses of morphine with small relief. We began morphine drip, ativan bolus, and haldol bolus with partial relief but still not comfortable. He did seem to respond with fentanyl bolus along with morphine infusion. Will maintain morphine infusion with fentanyl bolus. Goal is comfort. I do not feel he would make it much longer and likely has hours left.   NEXT OF KIN - children at bedside    SUMMARY OF RECOMMENDATIONS   Full comfort care   Code Status/Advance Care Planning:  DNR   Symptom Management:   Dyspnea/pain: Multiple attempts made at comfort. Continue morphine infusion at 4 mg/hr. Give fentanyl 100-150 mcg bolus every 30 min prn.   Agitation/anxiety: Lorazepam 2 mg every 4 hours prn. Haldol 2-4 mg every 4 hours prn.   Secretions: Robinul 0.2 mg every 4 hours prn.   Palliative Prophylaxis:   Frequent Pain Assessment and Oral Care  Additional  Recommendations (Limitations, Scope, Preferences):  Full Comfort Care  Psycho-social/Spiritual:   Desire for further Chaplaincy support:yes  Additional Recommendations: Caregiving  Support/Resources and Grief/Bereavement Support  Prognosis:   Hours - Days  Discharge Planning: Anticipated Hospital Death      Primary Diagnoses: Present on Admission:  . CVA (cerebral infarction) . CVA (cerebral vascular accident) ([place])  I have reviewed the medical record, interviewed the patient and family, and examined the patient. The following aspects are pertinent.  Past Medical History  Diagnosis Date  . Pancreatitis   . Fractured hip ([place]) 09/2011  . Prostate tumor     to have bx by Dr. Jasmine December  . UTI (lower urinary tract infection) 06/13/2012    has had x 3 months  . Falls 06/13/12    hit head approx. 1 month ago  . Unsteady gait 06/13/2012    "bad for awhile but getting worse recently"  . Slurred speech     slurred speech on 06/12/12 - didn't last long, slower talking recently  . Cancer ([place])     bladder  . Lung abnormality     spot on ct approx 3 months ago - to be followed -  [facility]  . 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 Nose fracture     after a fall - Oct. 2012  . Hypertension   . Dysrhythmia     history of a-fib  - followed by Dr. Lovena Le   . Osteopenia   . Fractures, stress     spine  . 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 Hallucinations     will hospitalized -  . Foley catheter in place   . Anxiety   . Ulcer of ankle ([place])     rt  . Atrial fibrillation ([place])   . Urinary retention 07/04/2014   Social History   Social History  . Marital Status: Widowed    Spouse Name: N/A  . Number of Children: N/A  . Years of Education: N/A   Social History Main Topics  . Smoking status: Current Every Day Smoker -- 0.25 packs/day for 62 years    Types: Cigarettes  . Smokeless tobacco: None  . Alcohol Use: No  . Drug  Use: No  . Sexual Activity: No   Other Topics Concern  . None   Social History Narrative   Family History  Problem Relation Age of Onset  . Heart disease      No family history  . Pancreatitis Mother   . Alcohol abuse Father    Scheduled Meds:  Continuous Infusions: . morphine 3 mg/hr (04/29/2016 1008)   PRN Meds:.[DISCONTINUED] acetaminophen **OR** acetaminophen, antiseptic oral rinse, glycopyrrolate **OR** glycopyrrolate **OR** glycopyrrolate, haloperidol **OR** haloperidol **OR** haloperidol lactate, [DISCONTINUED] LORazepam **OR** LORazepam **OR** LORazepam, morphine injection, morphine, [DISCONTINUED] ondansetron **OR** ondansetron (ZOFRAN) IV, polyvinyl alcohol Medications Prior to Admission:  Prior to Admission medications   Medication Sig Start Date End Date Taking? Authorizing Provider  amoxicillin (AMOXIL) 500 MG capsule Take 1 capsule (500 mg total) by mouth 3 (three) times daily. 04/20/16  Yes Courtney Forcucci, PA-C  Ascorbic Acid (VITAMIN C) 1000 MG tablet Take 1,000 mg by mouth 3 (three) times daily.   Yes Historical Provider, MD  b complex vitamins tablet Take 1 tablet by mouth daily.   Yes Historical Provider, MD  Cholecalciferol (VITAMIN D3) 2000 UNITS capsule Take 4,000 Units by mouth daily.    Yes Historical Provider, MD  citalopram (CELEXA) 40 MG tablet TAKE 1 TABLET ONCE DAILY. 02/14/16  Yes Unk Pinto, MD  docusate sodium (COLACE) 100 MG capsule Take 200 mg by mouth 2 (two) times daily.   Yes Historical Provider, MD  Ensure Plus (ENSURE PLUS) LIQD Take 237 mLs by mouth 2 (two) times daily between meals.   Yes Historical Provider, MD  LORazepam (ATIVAN) 1 MG tablet TAKE 1/2 TO 1 TABLET THREE TIMES DAILY AS NEEDED. 11/27/15  Yes Unk Pinto, MD  mirtazapine (REMERON) 30 MG tablet TAKE ONE TABLET AT BEDTIME. Patient taking differently: TAKE 39ms AT BEDTIME daily 08/24/15  Yes WUnk Pinto MD  nitroGLYCERIN (NITROSTAT) 0.4 MG SL tablet Place 1 tablet (0.4 mg  total) under the tongue every 5 (five) minutes as needed for chest pain. 01/30/16 01/29/17 Yes AVicie Mutters PA-C  oxybutynin (DITROPAN) 5 MG tablet Take 5 mg by mouth 4 (four) times daily as needed for bladder spasms.  04/08/16  Yes Historical Provider, MD  Probiotic Product (PROBIOTIC DAILY PO) Take 1 capsule by mouth daily.    Yes Historical Provider, MD  triamcinolone cream (KENALOG) 0.1 % Apply 1 application topically 2 (two) times daily. 01/30/16  Yes AEstill Bamberg  Silverio Lay, PA-C  ampicillin (PRINCIPEN) 250 MG capsule Take 1 capsule (250 mg total) by mouth 4 (four) times daily. Patient not taking: Reported on 04/22/2016 03/12/16   Vicie Mutters, PA-C   No Known Allergies Review of Systems  Unable to perform ROS   Physical Exam  Constitutional: He appears well-developed. He has a sickly appearance.  Cardiovascular: Tachycardia present.   Pulmonary/Chest: Accessory muscle usage present. Tachypnea noted. He is in respiratory distress. He has rhonchi.  Extreme distress. Gasping for air.   Abdominal: Soft. Normal appearance.  Neurological: He is unresponsive.  Psychiatric: He is agitated.    Vital Signs: BP 156/128 mmHg  Pulse 158  Temp(Src) 97.8 F (36.6 C) (Axillary)  Resp 24  SpO2 95% Pain Assessment: FLACC       SpO2: SpO2: 95 % O2 Device:SpO2: 95 % O2 Flow Rate: .   IO: Intake/output summary:  Intake/Output Summary (Last 24 hours) at May 06, 2016 1025 Last data filed at 04/22/16 1606  Gross per 24 hour  Intake      0 ml  Output    200 ml  Net   -200 ml    LBM: Last BM Date:  (pta) Baseline Weight:   Most recent weight:       Palliative Assessment/Data:   Flowsheet Rows        Most Recent Value   Intake Tab    Referral Department  Hospitalist   Unit at Time of Referral  Oncology Unit   Palliative Care Primary Diagnosis  Cancer   Date Notified  04/22/16   Palliative Care Type  New Palliative care   Reason for referral  End of Life Care Assistance   Date of Admission   04/22/16   Date first seen by Palliative Care  May 06, 2016   # of days Palliative referral response time  1 Day(s)   # of days IP prior to Palliative referral  0   Clinical Assessment    Psychosocial & Spiritual Assessment    Palliative Care Outcomes       Time In: 0910 Time Out: 1100 Time Total: 139mn Greater than 50%  of this time was spent counseling and coordinating care related to the above assessment and plan.  Signed by: PPershing Proud NP   Please contact Palliative Medicine Team phone at [phone] questions and concerns.  For individual provider: See AShea Evans

## 2016-05-21 DEATH — deceased

## 2017-02-02 ENCOUNTER — Encounter: Payer: Self-pay | Admitting: Physician Assistant
